# Patient Record
Sex: Female | Born: 1955
Health system: Southern US, Community
[De-identification: ages and names within clinical notes are randomized; demographics above are authoritative.]

## PROBLEM LIST (undated history)

## (undated) DIAGNOSIS — M810 Age-related osteoporosis without current pathological fracture: Secondary | ICD-10-CM

## (undated) DIAGNOSIS — T7840XA Allergy, unspecified, initial encounter: Secondary | ICD-10-CM

## (undated) DIAGNOSIS — F32A Depression, unspecified: Secondary | ICD-10-CM

## (undated) DIAGNOSIS — Z8639 Personal history of other endocrine, nutritional and metabolic disease: Secondary | ICD-10-CM

## (undated) DIAGNOSIS — J449 Chronic obstructive pulmonary disease, unspecified: Secondary | ICD-10-CM

## (undated) DIAGNOSIS — I10 Essential (primary) hypertension: Secondary | ICD-10-CM

## (undated) DIAGNOSIS — H409 Unspecified glaucoma: Secondary | ICD-10-CM

## (undated) DIAGNOSIS — F419 Anxiety disorder, unspecified: Secondary | ICD-10-CM

## (undated) DIAGNOSIS — T6591XA Toxic effect of unspecified substance, accidental (unintentional), initial encounter: Secondary | ICD-10-CM

## (undated) DIAGNOSIS — F329 Major depressive disorder, single episode, unspecified: Secondary | ICD-10-CM

## (undated) DIAGNOSIS — H269 Unspecified cataract: Secondary | ICD-10-CM

## (undated) DIAGNOSIS — S76309A Unspecified injury of muscle, fascia and tendon of the posterior muscle group at thigh level, unspecified thigh, initial encounter: Secondary | ICD-10-CM

## (undated) DIAGNOSIS — G4726 Circadian rhythm sleep disorder, shift work type: Secondary | ICD-10-CM

## (undated) DIAGNOSIS — B019 Varicella without complication: Secondary | ICD-10-CM

## (undated) HISTORY — DX: Personal history of other endocrine, nutritional and metabolic disease: Z86.39

## (undated) HISTORY — DX: Depression, unspecified: F32.A

## (undated) HISTORY — DX: Unspecified cataract: H26.9

## (undated) HISTORY — DX: Anxiety disorder, unspecified: F41.9

## (undated) HISTORY — DX: Varicella without complication: B01.9

## (undated) HISTORY — DX: Unspecified injury of muscle, fascia and tendon of the posterior muscle group at thigh level, unspecified thigh, initial encounter: S76.309A

## (undated) HISTORY — DX: Toxic effect of unspecified substance, accidental (unintentional), initial encounter: T65.91XA

## (undated) HISTORY — DX: Chronic obstructive pulmonary disease, unspecified: J44.9

## (undated) HISTORY — DX: Essential (primary) hypertension: I10

## (undated) HISTORY — DX: Major depressive disorder, single episode, unspecified: F32.9

## (undated) HISTORY — DX: Allergy, unspecified, initial encounter: T78.40XA

## (undated) HISTORY — DX: Circadian rhythm sleep disorder, shift work type: G47.26

## (undated) HISTORY — PX: COLONOSCOPY: SHX174

## (undated) HISTORY — DX: Age-related osteoporosis without current pathological fracture: M81.0

## (undated) HISTORY — DX: Unspecified glaucoma: H40.9

---
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
## 1998-09-10 ENCOUNTER — Ambulatory Visit (HOSPITAL_COMMUNITY): Admission: RE | Admit: 1998-09-10 | Discharge: 1998-09-10 | Payer: Self-pay | Admitting: *Deleted

## 1998-09-10 ENCOUNTER — Encounter: Payer: Self-pay | Admitting: *Deleted

## 1998-12-08 ENCOUNTER — Other Ambulatory Visit: Admission: RE | Admit: 1998-12-08 | Discharge: 1998-12-08 | Payer: Self-pay | Admitting: *Deleted

## 1999-09-22 ENCOUNTER — Ambulatory Visit (HOSPITAL_COMMUNITY): Admission: RE | Admit: 1999-09-22 | Discharge: 1999-09-22 | Payer: Self-pay | Admitting: *Deleted

## 1999-09-22 ENCOUNTER — Encounter: Payer: Self-pay | Admitting: *Deleted

## 1999-12-09 ENCOUNTER — Other Ambulatory Visit: Admission: RE | Admit: 1999-12-09 | Discharge: 1999-12-09 | Payer: Self-pay | Admitting: *Deleted

## 2000-09-20 ENCOUNTER — Ambulatory Visit (HOSPITAL_COMMUNITY): Admission: RE | Admit: 2000-09-20 | Discharge: 2000-09-20 | Payer: Self-pay | Admitting: *Deleted

## 2000-09-20 ENCOUNTER — Encounter: Payer: Self-pay | Admitting: *Deleted

## 2001-02-21 ENCOUNTER — Ambulatory Visit (HOSPITAL_COMMUNITY): Admission: RE | Admit: 2001-02-21 | Discharge: 2001-02-21 | Payer: Self-pay | Admitting: *Deleted

## 2001-02-21 ENCOUNTER — Encounter: Payer: Self-pay | Admitting: *Deleted

## 2001-02-27 ENCOUNTER — Other Ambulatory Visit: Admission: RE | Admit: 2001-02-27 | Discharge: 2001-02-27 | Payer: Self-pay | Admitting: *Deleted

## 2001-06-21 ENCOUNTER — Other Ambulatory Visit: Admission: RE | Admit: 2001-06-21 | Discharge: 2001-06-21 | Payer: Self-pay | Admitting: Gynecology

## 2001-06-21 ENCOUNTER — Encounter (INDEPENDENT_AMBULATORY_CARE_PROVIDER_SITE_OTHER): Payer: Self-pay | Admitting: Specialist

## 2001-09-17 ENCOUNTER — Encounter: Payer: Self-pay | Admitting: Gynecology

## 2001-09-17 ENCOUNTER — Ambulatory Visit (HOSPITAL_COMMUNITY): Admission: RE | Admit: 2001-09-17 | Discharge: 2001-09-17 | Payer: Self-pay | Admitting: Gynecology

## 2002-05-01 ENCOUNTER — Other Ambulatory Visit: Admission: RE | Admit: 2002-05-01 | Discharge: 2002-05-01 | Payer: Self-pay | Admitting: Gynecology

## 2002-09-11 ENCOUNTER — Ambulatory Visit (HOSPITAL_COMMUNITY): Admission: RE | Admit: 2002-09-11 | Discharge: 2002-09-11 | Payer: Self-pay | Admitting: Gynecology

## 2002-09-11 ENCOUNTER — Encounter: Payer: Self-pay | Admitting: Gynecology

## 2002-12-11 ENCOUNTER — Inpatient Hospital Stay (HOSPITAL_COMMUNITY): Admission: EM | Admit: 2002-12-11 | Discharge: 2002-12-15 | Payer: Self-pay | Admitting: Psychiatry

## 2003-05-07 ENCOUNTER — Other Ambulatory Visit: Admission: RE | Admit: 2003-05-07 | Discharge: 2003-05-07 | Payer: Self-pay | Admitting: Gynecology

## 2003-07-27 ENCOUNTER — Inpatient Hospital Stay (HOSPITAL_COMMUNITY): Admission: EM | Admit: 2003-07-27 | Discharge: 2003-08-01 | Payer: Self-pay | Admitting: Psychiatry

## 2003-10-06 ENCOUNTER — Ambulatory Visit (HOSPITAL_COMMUNITY): Admission: RE | Admit: 2003-10-06 | Discharge: 2003-10-06 | Payer: Self-pay | Admitting: Gynecology

## 2004-11-25 ENCOUNTER — Ambulatory Visit (HOSPITAL_COMMUNITY): Admission: RE | Admit: 2004-11-25 | Discharge: 2004-11-25 | Payer: Self-pay | Admitting: Gynecology

## 2004-12-08 ENCOUNTER — Other Ambulatory Visit: Admission: RE | Admit: 2004-12-08 | Discharge: 2004-12-08 | Payer: Self-pay | Admitting: Gynecology

## 2004-12-15 ENCOUNTER — Ambulatory Visit: Payer: Self-pay | Admitting: Family Medicine

## 2005-12-07 ENCOUNTER — Ambulatory Visit (HOSPITAL_COMMUNITY): Admission: RE | Admit: 2005-12-07 | Discharge: 2005-12-07 | Payer: Self-pay | Admitting: Gynecology

## 2005-12-21 ENCOUNTER — Other Ambulatory Visit: Admission: RE | Admit: 2005-12-21 | Discharge: 2005-12-21 | Payer: Self-pay | Admitting: Gynecology

## 2006-05-01 ENCOUNTER — Ambulatory Visit: Payer: Self-pay | Admitting: Internal Medicine

## 2006-05-04 ENCOUNTER — Ambulatory Visit (HOSPITAL_COMMUNITY): Admission: RE | Admit: 2006-05-04 | Discharge: 2006-05-04 | Payer: Self-pay | Admitting: Internal Medicine

## 2006-06-22 ENCOUNTER — Ambulatory Visit (HOSPITAL_COMMUNITY): Admission: RE | Admit: 2006-06-22 | Discharge: 2006-06-22 | Payer: Self-pay | Admitting: Gynecology

## 2006-07-11 ENCOUNTER — Ambulatory Visit: Admission: RE | Admit: 2006-07-11 | Discharge: 2006-07-11 | Payer: Self-pay | Admitting: Gynecologic Oncology

## 2006-11-15 ENCOUNTER — Other Ambulatory Visit: Admission: RE | Admit: 2006-11-15 | Discharge: 2006-11-15 | Payer: Self-pay | Admitting: Gynecology

## 2006-11-28 ENCOUNTER — Ambulatory Visit: Admission: RE | Admit: 2006-11-28 | Discharge: 2006-11-28 | Payer: Self-pay | Admitting: Gynecologic Oncology

## 2006-12-05 ENCOUNTER — Ambulatory Visit (HOSPITAL_COMMUNITY): Admission: RE | Admit: 2006-12-05 | Discharge: 2006-12-05 | Payer: Self-pay | Admitting: Gynecologic Oncology

## 2006-12-06 ENCOUNTER — Ambulatory Visit: Payer: Self-pay | Admitting: Internal Medicine

## 2006-12-27 ENCOUNTER — Ambulatory Visit (HOSPITAL_COMMUNITY): Admission: RE | Admit: 2006-12-27 | Discharge: 2006-12-27 | Payer: Self-pay | Admitting: Gynecology

## 2007-04-05 ENCOUNTER — Ambulatory Visit (HOSPITAL_COMMUNITY): Admission: RE | Admit: 2007-04-05 | Discharge: 2007-04-05 | Payer: Self-pay | Admitting: Gynecologic Oncology

## 2007-04-09 ENCOUNTER — Ambulatory Visit: Admission: RE | Admit: 2007-04-09 | Discharge: 2007-04-09 | Payer: Self-pay | Admitting: Gynecologic Oncology

## 2007-08-13 ENCOUNTER — Telehealth: Payer: Self-pay | Admitting: Internal Medicine

## 2007-10-09 ENCOUNTER — Ambulatory Visit (HOSPITAL_COMMUNITY): Admission: RE | Admit: 2007-10-09 | Discharge: 2007-10-09 | Payer: Self-pay | Admitting: Gynecology

## 2007-11-04 ENCOUNTER — Encounter: Payer: Self-pay | Admitting: Internal Medicine

## 2007-11-28 ENCOUNTER — Other Ambulatory Visit: Admission: RE | Admit: 2007-11-28 | Discharge: 2007-11-28 | Payer: Self-pay | Admitting: Gynecology

## 2007-12-12 ENCOUNTER — Ambulatory Visit (HOSPITAL_COMMUNITY): Admission: RE | Admit: 2007-12-12 | Discharge: 2007-12-12 | Payer: Self-pay | Admitting: Gastroenterology

## 2007-12-12 ENCOUNTER — Encounter: Payer: Self-pay | Admitting: Internal Medicine

## 2007-12-17 ENCOUNTER — Encounter: Payer: Self-pay | Admitting: Internal Medicine

## 2007-12-17 DIAGNOSIS — F329 Major depressive disorder, single episode, unspecified: Secondary | ICD-10-CM

## 2007-12-17 DIAGNOSIS — F411 Generalized anxiety disorder: Secondary | ICD-10-CM | POA: Insufficient documentation

## 2007-12-17 DIAGNOSIS — F3289 Other specified depressive episodes: Secondary | ICD-10-CM | POA: Insufficient documentation

## 2007-12-17 DIAGNOSIS — F418 Other specified anxiety disorders: Secondary | ICD-10-CM | POA: Insufficient documentation

## 2007-12-17 DIAGNOSIS — G4700 Insomnia, unspecified: Secondary | ICD-10-CM

## 2008-01-31 ENCOUNTER — Ambulatory Visit (HOSPITAL_COMMUNITY): Admission: RE | Admit: 2008-01-31 | Discharge: 2008-01-31 | Payer: Self-pay | Admitting: Gynecology

## 2008-02-25 ENCOUNTER — Telehealth: Payer: Self-pay | Admitting: Internal Medicine

## 2008-03-09 ENCOUNTER — Ambulatory Visit: Payer: Self-pay | Admitting: Internal Medicine

## 2008-03-09 DIAGNOSIS — G4729 Other circadian rhythm sleep disorder: Secondary | ICD-10-CM

## 2008-03-09 DIAGNOSIS — F17200 Nicotine dependence, unspecified, uncomplicated: Secondary | ICD-10-CM | POA: Insufficient documentation

## 2008-03-09 DIAGNOSIS — M899 Disorder of bone, unspecified: Secondary | ICD-10-CM | POA: Insufficient documentation

## 2008-03-09 DIAGNOSIS — M949 Disorder of cartilage, unspecified: Secondary | ICD-10-CM

## 2008-03-09 DIAGNOSIS — M810 Age-related osteoporosis without current pathological fracture: Secondary | ICD-10-CM | POA: Insufficient documentation

## 2008-04-24 ENCOUNTER — Ambulatory Visit (HOSPITAL_COMMUNITY): Admission: RE | Admit: 2008-04-24 | Discharge: 2008-04-24 | Payer: Self-pay | Admitting: Gynecology

## 2008-09-15 ENCOUNTER — Telehealth: Payer: Self-pay | Admitting: Internal Medicine

## 2008-10-23 ENCOUNTER — Telehealth: Payer: Self-pay | Admitting: Internal Medicine

## 2008-10-28 ENCOUNTER — Ambulatory Visit: Payer: Self-pay | Admitting: Internal Medicine

## 2008-11-28 ENCOUNTER — Ambulatory Visit (HOSPITAL_COMMUNITY): Admission: RE | Admit: 2008-11-28 | Discharge: 2008-11-28 | Payer: Self-pay | Admitting: Gynecology

## 2009-02-05 ENCOUNTER — Ambulatory Visit (HOSPITAL_COMMUNITY): Admission: RE | Admit: 2009-02-05 | Discharge: 2009-02-05 | Payer: Self-pay | Admitting: Gynecology

## 2009-04-01 ENCOUNTER — Telehealth: Payer: Self-pay | Admitting: Internal Medicine

## 2009-07-23 ENCOUNTER — Ambulatory Visit: Payer: Self-pay | Admitting: Internal Medicine

## 2009-07-23 DIAGNOSIS — L259 Unspecified contact dermatitis, unspecified cause: Secondary | ICD-10-CM

## 2009-08-26 ENCOUNTER — Emergency Department (HOSPITAL_COMMUNITY): Admission: EM | Admit: 2009-08-26 | Discharge: 2009-08-26 | Payer: Self-pay | Admitting: Emergency Medicine

## 2009-09-13 ENCOUNTER — Ambulatory Visit: Admission: RE | Admit: 2009-09-13 | Discharge: 2009-09-13 | Payer: Self-pay | Admitting: Internal Medicine

## 2009-09-13 ENCOUNTER — Encounter: Payer: Self-pay | Admitting: Internal Medicine

## 2009-09-13 ENCOUNTER — Ambulatory Visit: Payer: Self-pay | Admitting: Vascular Surgery

## 2009-10-21 ENCOUNTER — Ambulatory Visit (HOSPITAL_COMMUNITY): Admission: RE | Admit: 2009-10-21 | Discharge: 2009-10-21 | Payer: Self-pay | Admitting: Gynecology

## 2009-11-19 ENCOUNTER — Ambulatory Visit (HOSPITAL_COMMUNITY): Admission: RE | Admit: 2009-11-19 | Discharge: 2009-11-19 | Payer: Self-pay | Admitting: Gynecology

## 2010-01-13 ENCOUNTER — Ambulatory Visit: Payer: Self-pay | Admitting: Internal Medicine

## 2010-01-13 DIAGNOSIS — L0100 Impetigo, unspecified: Secondary | ICD-10-CM | POA: Insufficient documentation

## 2010-01-13 DIAGNOSIS — M79609 Pain in unspecified limb: Secondary | ICD-10-CM | POA: Insufficient documentation

## 2010-01-14 ENCOUNTER — Encounter: Payer: Self-pay | Admitting: Internal Medicine

## 2010-02-01 ENCOUNTER — Ambulatory Visit: Payer: Self-pay

## 2010-02-01 ENCOUNTER — Encounter: Payer: Self-pay | Admitting: Internal Medicine

## 2010-02-23 ENCOUNTER — Ambulatory Visit (HOSPITAL_COMMUNITY): Admission: RE | Admit: 2010-02-23 | Discharge: 2010-02-23 | Payer: Self-pay | Admitting: Gynecology

## 2010-10-16 ENCOUNTER — Encounter: Payer: Self-pay | Admitting: Gynecologic Oncology

## 2010-10-16 ENCOUNTER — Encounter: Payer: Self-pay | Admitting: Gynecology

## 2010-10-17 ENCOUNTER — Encounter: Payer: Self-pay | Admitting: Gynecology

## 2010-10-27 NOTE — Miscellaneous (Signed)
Summary: Orders Update  Clinical Lists Changes  Problems: Added new problem of UNSPECIFIED PERIPHERAL VASCULAR DISEASE (ICD-443.9) Orders: Added new Test order of Arterial Duplex Lower Extremity (Arterial Duplex Low) - Signed 

## 2010-10-27 NOTE — Assessment & Plan Note (Signed)
Summary: 6 month rov/njr   Vital Signs:  Patient profile:   55 year old female Menstrual status:  postmenopausal Weight:      131 pounds Pulse rate:   78 / minute BP sitting:   120 / 70  (left arm) Cuff size:   regular  Vitals Entered By: Romualdo Bolk, CMA (AAMA) (January 13, 2010 8:38 AM) CC: follow-up visit   History of Present Illness: Jasmine Nelson comes in today for follow up of medcial management  Since last visit  had ann injury at work  where she was seen in  Occupational health ... tripped over a rocking chair and tore right hamstring.  with bruising and  swelling down leg   She saw [facility] .  Had  swellingin lower  leg and rx for cellulitis and DVT evlautation  negative .   Released.   but has continued red leg.   Maybe a skin problem.   Still has stiffness in lower leg  and feels tight . Says the hair follicles are pricly and growing back funny.  This never ocurred before thei injury  still has some redness .   Also had nasal sores left fo=more than right rx by [facility] with levaquin  and improved but coming and going .  sore a crusted today . No fever and no culture ever done.  Sleep shift work doing ok on [employer].   now will be getting rx from [facility].   Tobacco: still smoking dermatitis: no change  Depr anx :    stable per Psych . Gyne  : had abn pap and high risk HPV had ? about this.    Preventive Screening-Counseling & Management  Alcohol-Tobacco     Alcohol drinks/day: 0     Smoking Status: current     Smoking Cessation Counseling: yes     Smoke Cessation Stage: contemplative     Packs/Day: 1.0  Caffeine-Diet-Exercise     Caffeine use/day: 5-6     Does Patient Exercise: no  Current Medications (verified): 1)  Cymbalta 60 Mg  Cpep (Duloxetine Hcl) .Marland Kitchen.. 1 By Mouth Once Daily 2)  Gabapentin 100 Mg Caps (Gabapentin) .... 3 By Mouth At Bedtime 3)  Ambien 10 Mg  Tabs (Zolpidem Tartrate) .... As Needed 4)  Nuvigil 150 Mg  Tabs (Armodafinil) .Marland Kitchen.. 1 By  Mouth Once Daily or As Directed 5)  Triamcinolone Acetonide 0.5 % Oint (Triamcinolone Acetonide) .... Apply To Hands Two Times A Day As Directed 6)  Vitamin D (Ergocalciferol) 50000 Unit Caps (Ergocalciferol)  Allergies (verified): No Known Drug Allergies  Past History:  Past medical, surgical, family and social histories (including risk factors) reviewed, and no changes noted (except as noted below).  Past Medical History: chicken pox as a child  3rd grade peds [employer] thyroid problem g0p0 ingestion 3/04   Shift work rx  TOrn hamstring Consults Meredith Baker-psych   [facility]  Gyne    osteopenia on dexa    Past Surgical History: Reviewed history from 03/09/2008 and no changes required. adenoidectomy  Past History:  Care Management: Gynecology: [facility] Dermatology: Danella Deis in the past Psychiatry: Valinda Hoar  , Nolen Mu  Family History: Reviewed history from 10/28/2008 and no changes required. father   osa  ht deceased mom NHLymphoma  1997 sister a and w  Ovarian cancer ? 2 aunts       Social History: Reviewed history from 10/28/2008 and no changes required. 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    Physical Exam  General:  alert, well-developed, well-nourished, and well-hydrated.   Head:  normocephalic and no abnormalities observed.   Eyes:  vision grossly intact, pupils equal, and pupils round.   Ears:  R ear normal and L ear normal.   Nose:  no external deformity.  small area red rough  left nostril and redness and tenderness without discharge or occulsion Mouth:  pharynx pink and moist.   Neck:  No  deformities, masses, or tenderness noted. Lungs:  Normal respiratory effort, chest expands symmetrically. Lungs are clear to auscultation, no crackles or wheezes.no dullness.   Heart:  Normal rate and regular rhythm. S1 and S2 normal without gallop, murmur, click, rub or other extra sounds. Abdomen:  Bowel sounds positive,abdomen soft and non-tender without masses, organomegaly or   noted. Msk:  left lower ext with local chronic red area medially right leg  no edema   some shinniness and  slightly assymetrical from left   left knee with tender non red swelling at inferior kneecap. otherwise nl. Pulses:  pulses intact without delay   Extremities:  trace left pedal edema and trace right pedal edema.   Neurologic:  alert & oriented X3 and gait normal.   Skin:  turgor normal, color normal, no petechiae, and no purpura.   Cervical Nodes:  No lymphadenopathy noted Psych:  Oriented X3, good eye contact, not anxious appearing, and not depressed appearing.     Impression & Recommendations:  Problem # 1:  IMPETIGO (ICD-684)? nasal  drusting and soreness off and on .   rx with levaquin but nver resolved  will do nasal swab cx and use topicals for now.  Orders: T-Culture, Wound ([id number])  Problem # 2:  PAIN IN SOFT TISSUES OF LIMB (ICD-729.5)  more tightness discomfort   ? chrinic changes from the edema of the injury?    can feel pulses but  reg get abi  because os a smoker  . r/o pvd   Orders: Vascular Clinic (Vascular)  Problem # 3:  OTHER CIRCADIAN RHYTHM SLEEP DISORDER (ICD-327.39) Assessment: Unchanged will be having meredith baker rx this and no more rx from [place]  currently.   Problem # 4:  TOBACCO USE (ICD-305.1)  rec dc  as before.   Orders: Vascular Clinic (Vascular)  Problem # 5:  PAP SMEAR, ABNORMAL  HPVHIGH RISK (ICD-795.00) Assessment: New not related to above problems   Complete Medication List: 1)  Cymbalta 60 Mg Cpep (Duloxetine hcl) .Marland Kitchen.. 1 by mouth once  daily 2)  Gabapentin 100 Mg Caps (Gabapentin) .... 3 by mouth at bedtime 3)  Ambien 10 Mg Tabs (Zolpidem tartrate) .... As needed 4)  Nuvigil 150 Mg Tabs (Armodafinil) .Marland Kitchen.. 1 by mouth once daily or as directed change ot meredith baker  4 2011 5)  Triamcinolone Acetonide 0.5 % Oint (Triamcinolone acetonide) .... Apply to hands two times a day as directed 6)  Vitamin D (ergocalciferol) 50000 Unit Caps (Ergocalciferol)  Patient Instructions: 1)  will call about culture. 2)  Use a topical antibiotic in the nostril    tic for now  3)  call if getting worse in meantime.  4)  In regard to your leg   will order a ABI     if ok  then   may do a ortho appt. 5)  Perhaps need more rehab for leg.    The previous swelling may have caused the skin changes.   6)  will contact  you  about result and plan

## 2010-12-08 ENCOUNTER — Other Ambulatory Visit (HOSPITAL_COMMUNITY): Payer: Self-pay | Admitting: Gynecology

## 2010-12-08 DIAGNOSIS — N83209 Unspecified ovarian cyst, unspecified side: Secondary | ICD-10-CM

## 2010-12-16 ENCOUNTER — Other Ambulatory Visit (HOSPITAL_COMMUNITY): Payer: Self-pay | Admitting: Gynecology

## 2010-12-16 ENCOUNTER — Ambulatory Visit (HOSPITAL_COMMUNITY)
Admission: RE | Admit: 2010-12-16 | Discharge: 2010-12-16 | Disposition: A | Discharge status: Home / Self Care | Payer: 59 | Source: Ambulatory Visit | Attending: Gynecology | Admitting: Gynecology

## 2010-12-16 DIAGNOSIS — N949 Unspecified condition associated with female genital organs and menstrual cycle: Secondary | ICD-10-CM | POA: Insufficient documentation

## 2010-12-16 DIAGNOSIS — N83209 Unspecified ovarian cyst, unspecified side: Secondary | ICD-10-CM

## 2010-12-16 DIAGNOSIS — Z8041 Family history of malignant neoplasm of ovary: Secondary | ICD-10-CM | POA: Insufficient documentation

## 2011-02-07 NOTE — Consult Note (Signed)
NAMEKAYCE, CHISMAR                  ACCOUNT NO.:  [id number]   MEDICAL RECORD NO.:  [id number]          PATIENT TYPE:  OUT   LOCATION:  GYN                          FACILITY:  [facility]   PHYSICIAN:  Paola A. Duard Brady, MD    DATE OF BIRTH:  07/06/1956   DATE OF CONSULTATION:  04/09/2007  DATE OF DISCHARGE:                                 CONSULTATION   The patient is a very pleasant, 55- soon to be 55 year old who we  initially saw in October of 2007.  She was referred to [place] for an ovarian  cyst as well as a family history of ovarian cancer with her paternal  grandmother having breast cancer and a maternal aunt who had ovarian  cancer.  Her family history is a little unclear as she may have had up  to 3 paternal aunts with ovarian cancer but she does not know the  details.  In September 2007, she showed a 1 x 1-cm cyst in the right  ovary with the left ovary having a 2.6 x 2.7-cm clear cyst and normal CA-  125.  Her CA-125 when I saw her March 2008 from September 2007 was 5.7.  When we saw her in March of 2008, she had a hypoechoic lesion within the  left ovary.  There were no mural nodules or solid components.  Measured  1.7 x 1.1 x 1.6 cm which represented a decrease in size compared to the  2.3 x 2.1 x 2.2-cm cyst in September 2007.  She comes in today for  follow-up.  She had ultrasound on July 11.  It revealed the uterus to be  normal in appearance.  The endometrium measured 2 mm.  There was a cyst  involving the left ovary.  It showed no evidence of internal flow on  Doppler.  It measured 1.7 x 1.1 x 1.3 cm and was stable in size as  compared to that in March and overall decreased as compared to that in  September.  She had a 1-cm simple cyst of the right ovary.  She comes in  today for evaluation.  She otherwise denies any significant complaints.  She has no symptoms of bloating, early satiety.  She had been menopausal  for the past 4 years, continues to have hot flashes but they are  decreasing.   PHYSICAL EXAMINATION:  ABDOMEN:  Is soft, nontender, nondistended.  There are no palpable masses or hepatosplenomegaly.  Groins are negative  for adenopathy.  EXTREMITIES:  There is no edema.  PELVIC:  External genitalia is within normal limits.  Bimanual  examination:  The cervix is palpably normal.  The corpus is normal size,  shape, consistency.  There are no adnexal masses.   ASSESSMENT:  A 55 year old with a 1-cm complex left ovarian cyst which  has been decreasing in size since September of 2007 in stable in size  from March of 2008.  We reviewed these findings.  I believe that  conservative follow-up can be continued as she has had no changes.  What  I would recommend is checking  a CA-125 today.  If her CA-125 is normal,  repeating an ultrasound in 6 months, if at that time there has been no  change, I believe that  she can continue to be followed.  She would like to have this follow-up  performed by Dr. Chevis Pretty.  We will send him a copy from today.  She knows  to call the hospital and schedule an ultrasound for 6 months' time.  Her  questions were elicited and answered to her satisfaction.      Paola A. Duard Brady, MD  Electronically Signed     PAG/MEDQ  D:  04/09/2007  T:  04/09/2007  Job:  [id number]   cc:   Neta Mends. Fabian Sharp, MD  [address] [place], [place] [address]   Leatha Gilding. Mezer, M.D.  Fax: [phone]   Telford Nab, R.N.  [address]  [place], [place] [address]

## 2011-02-10 NOTE — H&P (Signed)
NAME:  Jasmine Nelson, Jasmine Nelson NO.:  [id number]   MEDICAL RECORD NO.:  [id number]                   PATIENT TYPE:  IPS   LOCATION:  0503                                 [facility]ACILITY[facility]   PHYSICIAN:  Jeanice Lim, M.D.              DATE OF BIRTH:  May 28, 1956   DATE OF ADMISSION:  12/11/2002  DATE OF DISCHARGE:  12/15/2002                         PSYCHIATRIC ADMISSION ASSESSMENT   IDENTIFYING INFORMATION:  This is a 55 year old married white female  voluntarily admitted on December 11, 2002.   HISTORY OF PRESENT ILLNESS:  The patient presents with a history of  intentional overdose.  The patient is unsure of what she took.  She thinks  she took a handful of her old Prozac.  She was drinking vodka and orange  juice prior to this ingestion.  She reports she has been working nights for  years.  She states she comes home and sometimes drinks in the morning up to  maybe 2-3 times per week and thinks she drinks more than she should.  She  impulsively overdosed on her medication.  She states her husband has been  unemployed for the past year and a half.  She states she has been out of her  routine and needs her space.  The patient states that she knows it is not  her husband's fault.  She feels very overwhelmed at times, having  interrupted sleep.  Reports decreased appetite.  Denies any hallucinations.  Denies withdrawal symptoms at this time.   PAST PSYCHIATRIC HISTORY:  First hospitalization to [facility].  No other inpatient admissions.  No outpatient treatment.  Used to  see Dr. Madaline Guthrie in the 1990s.  No history of a suicide attempt.   SOCIAL HISTORY:  This is a 55 year old married white female, married for 21  years, first marriage, no children.  Lives with her husband.  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  No legal charges.  Completed her bachelors  degree.   ALCOHOL/DRUG HISTORY:  The patient smokes.  Denies any drug use.  Has  been  drinking up to 2-3 times per week, in the morning.   PRIMARY CARE PHYSICIAN:  Dr. Berniece Andreas in [place] at [facility].   MEDICAL PROBLEMS:  None.   MEDICATIONS:  Lexapro 10 mg (has been on it for one month, prescribed by Dr.  Fabian Sharp).   ALLERGIES:  No known allergies.   PHYSICAL EXAMINATION:  The patient was assessed at [facility] after her  overdose.  The patient is a middle-aged Caucasian female in no acute  distress, healthy-appearing.  Her vital signs are temperature 98.9, heart  rate 94, respirations 20, blood pressure 147/85.  She is 113 pounds.   LABORATORY DATA:  Her CMET with BUN 5.  Urine pregnancy test was negative.  Alcohol level was 272 on arrival.   MENTAL STATUS EXAM:  She is an alert, middle-aged female.  Cooperative with  fair eye contact.  Speech is clear.  Mood, patient feels very embarrassed,  feels bad about the situation.  Affect is flat, very pleasant.  Thought  processes are coherent.  No evidence of psychosis.  No auditory or visual  hallucinations.  No suicidal or homicidal ideation.  Cognitive function  intact.  Memory is good.  Judgment is fair.  Insight is poor in regards to  alcohol intake and consequences.   DIAGNOSES:   AXIS I:  1. Major depressive disorder.  2. Alcohol abuse.   AXIS II:  Deferred.   AXIS III:  None.   AXIS IV:  Problems with primary support group, economic, other psychosocial  problems.   AXIS V:  Current 40; estimated this past year 70.   PLAN:  Voluntary admission for intentional overdose.  Contract for safety.  Check every 15 minutes.  Will resume her Lexapro to decrease depressive  symptoms.  Have Librium available for detox on a p.r.n. basis.  Encourage  fluids.  Will increase coping skills by attending groups.  The patient to  remain alcohol-free.  Have a family session prior to discharge and follow up  with a private psychiatrist.   TENTATIVE LENGTH OF STAY:  Three to four days.     Landry Corporal, N.P.                       Jeanice Lim, M.D.    JO/MEDQ  D:  12/15/2002  T:  12/15/2002  Job:  [id number]

## 2011-02-10 NOTE — Discharge Summary (Signed)
NAME:  Jasmine Nelson, Jasmine Nelson NO.:  [id number]   MEDICAL RECORD NO.:  [id number]                   PATIENT TYPE:  IPS   LOCATION:  0301                                 [facility]ACILITY:  [facility]   PHYSICIAN:  Jeanice Lim, M.D.              DATE OF BIRTH:  06-Jan-1956   DATE OF ADMISSION:  07/27/2003  DATE OF DISCHARGE:  08/01/2003                                 DISCHARGE SUMMARY   IDENTIFYING DATA:  This is a 55 year old Caucasian female involuntarily  committed, passed out on floor.  Husband was Hotel manager.  Admitted to daily  use of alcohol, three-quarters of a bottle.  The patient out of work since  October secondary to alcohol use.  Increased anxiety for one month.  Depressed mood.  Second hospitalization for this patient.   MEDICATIONS:  Climara (not using), Effexor XR 150 mg (uses only 150 mg a  day).  Not clear Effexor helps with anxiety but taking it only 150 mg daily  when she was prescribed 150 mg b.i.d.   ALLERGIES:  No known drug allergies.   PHYSICAL EXAMINATION:  Within normal limits.  Neurologically nonfocal.   LABORATORY DATA:  Routine admission labs within normal limits.   MENTAL STATUS EXAM:  Fully alert, reclusive, resistant to interview,  withholding of emotions and thoughts, meanings of alcohol use, minimizing  addiction.  Speech within normal limits.  Mood irritable, apathetic.  Thought processes goal directed.  Suicidal ideation, vague.  Contracting for  safety.  Cognitively intact.  Judgment and insight poor with poor impulse  control.   ADMISSION DIAGNOSES:   AXIS I:  1. Major depressive disorder, recurrent, moderate.  2. Alcohol dependence.   AXIS II:  Depressed.   AXIS III:  Elevated blood pressure possibly related to withdrawal of alcohol  use.   AXIS IV:  Moderate (conflicts with primary support system).   AXIS V:  35/60.   HOSPITAL COURSE:  The patient was admitted and ordered routine p.r.n.  medications and underwent  further monitoring.  Was encouraged to participate  in individual, group and milieu therapy.  The patient was placed on a  Librium detox protocol and monitored for safety and treated for depression.  The patient reported a positive response to medication changes and  interventions.  Tolerated medication changes without side effects.  The  patient reported no dangerous ideation or withdrawal symptoms at the time of  discharge.  Affect was brighter and patient reported motivation to be  compliant with the aftercare plan.   DISCHARGE MEDICATIONS:  1. Effexor XR 75 mg, 3 daily.  2. Neurontin 100 mg, 2 tabs three times a day.  3. Seroquel 25 mg q.h.s.  4. Ambien 10 mg q.h.s. p.r.n.   FOLLOW UP:  The patient was to follow up with the [facility] on August 04, 2003 at 12 p.m.   DISCHARGE DIAGNOSES:   AXIS I:  1. Major depressive disorder, recurrent, moderate.  2. Alcohol dependence.   AXIS II:  Depressed.   AXIS III:  Elevated blood pressure possibly related to withdrawal of alcohol  use.   AXIS IV:  Moderate (conflicts with primary support system).   AXIS V:  Global Assessment of Functioning on discharge 55.                                               Jeanice Lim, M.D.    JEM/MEDQ  D:  08/27/2003  T:  08/27/2003  Job:  [id number]

## 2011-02-10 NOTE — Consult Note (Signed)
NAMEARRIN, Jasmine Nelson                  ACCOUNT NO.:  [id number]   MEDICAL RECORD NO.:  [id number]          PATIENT TYPE:  OUT   LOCATION:  GYN                          FACILITY[facility]  [facility]   PHYSICIAN:  Paola A. Duard Brady, MD    DATE OF BIRTH:  1956/05/10   DATE OF CONSULTATION:  11/28/2006  DATE OF DISCHARGE:                                 CONSULTATION   HISTORY:  The patient is a 55 year old who we initially saw in October  2007.  She was referred to [place] secondary to an ovarian cyst, as well as a  family history of ovarian cancer.  She had ultrasound in September 2007  that revealed a 1 x 1 cm cyst in the right ovary.  The left ovary had a  2.6 x 2.7 cm clear appearing cyst CA-125's per her report have been  normal.  The most recent CA-125 that we had recorded was June 20, 2006; and it was 5.7.  When I saw her in October it was deemed that we  would follow up with the cyst in 6 months with an ultrasound and CA-125.  She was seen by Dr. Chevis Pretty for her annual examination in February 2007.  At that time, her exam was apparently unremarkable.  No ultrasound or CA-  125 was performed at that time.  The patient is up-to-date on her  mammograms as of December 07, 2005.  She otherwise denies any significant  complaints.   PAST MEDICAL HISTORY:  Anxiety.   MEDICATIONS:  Neurontin.   ALLERGIES:  None.   PAST SURGICAL HISTORY:  1. D&C  2. Hysteroscopy for polyps.   SOCIAL HISTORY:  She smokes 1/2 to 1 pack per day.  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   FAMILY HISTORY:  She has a paternal grandmother with breast cancer and  cervical cancer.  She had a maternal aunt who died of ovarian cancer in  her 73s, and three paternal great aunts who may have had ovarian cancer.  Her paternal grandmother also had endometrial cancer.   PHYSICAL EXAMINATION:  VITAL SIGNS:  135 pounds, well nourished well-  developed female in no acute distress.  Physical exam was  deferred.   Twenty-five minutes of face-to-face time was spent with the patient  discussing options.  As we do not have any other additional imaging, we  will proceed with an ultrasound to evaluate for ovarian pathology.  The  patient is, today, complaining of dyspareunia, while a component that  may be vaginal dryness versus related to cervical or uterine changes is  difficult to ascertain.  We will give her some topical ointments to use;  namely, Astroglide to see if that helps with her complaints of  dyspareunia scheduled for her for an ultrasound.  Once that ultrasound  is available, we will contact her with disposition and recommendations  for the consideration of surgery.  She does work third  shift.  There are no other technicians that work third shift with her;  therefore, if we were to contemplate surgery, she  would be planning to  go out as far as mid-May in order to give her supervisor ample time for  scheduling arrangements.      Paola A. Duard Brady, MD  Electronically Signed     PAG/MEDQ  D:  11/28/2006  T:  11/28/2006  Job:  [id number]   cc:   Neta Mends. Fabian Sharp, MD  [address] [place], [place] [address]   Leatha Gilding. Mezer, M.D.  Fax: [phone]   Telford Nab, R.N.  [address]  [place], [place] [address]

## 2011-02-10 NOTE — Consult Note (Signed)
NAMEJEMMIE, Jasmine Nelson                  ACCOUNT NO.:  [id number]   MEDICAL RECORD NO.:  [id number]          PATIENT TYPE:  OUT   LOCATION:  GYN                          FACILITY:  [facility]   PHYSICIAN:  Paola A. Duard Brady, MD    DATE OF BIRTH:  1956-08-23   DATE OF CONSULTATION:  07/11/2006  DATE OF DISCHARGE:                                   CONSULTATION   HISTORY OF PRESENT ILLNESS:  The patient is seen today in consultation at  the request of Dr. Chevis Pretty.  Jasmine Nelson is a 55 year old gravida 0 who has been  menopausal for approximately 4 years.  She is referred to [place] secondary to  ovarian cyst as well as a significant family history of ovarian carcinoma.  She did undergo an ultrasound in September 2007 that revealed the right  ovary to measure 2x1.4 cm with a 1x1 cm cyst.  Left ovary measured 3.1x2.9  cm with a 2.6x2.7 cm clear appearing cyst.  Per report, CA-125 has been  drawn and has been unremarkable.  Her most recent CA-125 that we have  recorded is from September 26 and it was 5.7.  The patient has been  undergoing biannual screening with a CA-125 and ultrasounds by Dr. Chevis Pretty  secondary to her family history of ovarian carcinoma.  Her history is a  little vague.  She has a maternal aunt who died of ovarian cancer in her  73s, and she has three paternal great-aunts who may have died of ovarian  carcinoma.  Unfortunately, all the affected individuals or those who know  more about the family history are all deceased.  The patient, herself, is  doing quite well.  She does have some hot flashes.  She has never been on  hormone replacement therapy.  She states that they have attempted to try her  on birth control pills including Ortho Evra for quite sometime, and she  never really tolerated it, as it made her feel poorly or she had bleeding.  She does occasionally have some diarrhea and occasional bloating which is  not consistent.  She has occasional pelvic pressure.  No distinct pain, no  early satiety.  No significant change in bowel or bladder habits.  In  hindsight, she states she does occasionally have some twinges of pelvic  pain.   PAST MEDICAL HISTORY:  Significant for anxiety.   MEDICATIONS:  1. Neurontin 200-300 mg daily.  2. SSRI-type medicine, she does not recall the name.   ALLERGIES:  None.   PAST SURGICAL HISTORY:  1. D&C.  2. Hysteroscopy for uterine polyps.   SOCIAL HISTORY:  She smokes a pack per day, she has done this for 30 years.  She denies use of alcohol.  She is married.  She is and Dentist at  [employer].   FAMILY HISTORY:  Family history is as above.  She had a paternal grandmother  with breast cancer and cervical cancer.  She survived into her 54's.  She  has a maternal aunt who died of ovarian cancer in her 52's and three  paternal  great-aunts who have had ovarian cancer.   HEALTH MAINTENANCE:  She is up-to-date on her mammograms and Pap smear.  She  never had a colonoscopy.   PHYSICAL EXAMINATION:  VITAL SIGNS:  Height 5 feet, 7 inches, weight 131  pounds.  GENERAL:  Well-nourished, well-developed female in no acute distress.  NECK:  Supple.  No lymphadenopathy, no thyromegaly.  LUNGS:  Clear to auscultation bilaterally.  CARDIOVASCULAR:  Regular rate and rhythm.  ABDOMEN:  Soft, nontender, nondistended.  No palpable masses or  hepatosplenomegaly.  Groins are negative for adenopathy.  PELVIC:  External genitalia is within normal limits.  Bimanual examination:  The cervix is palpably normal.  The corpus of normal size, shape,  consistency.  No adnexal masses appreciated.   ASSESSMENT:  A 55 year old gravida 0 with a family history of breast ovarian  carcinoma who also has an ovarian cyst.  She comes in today for evaluation  and follow-up.  I do think genetic testing will be a minimal  informativeness, as she herself is not affected.  I do think her risk  is at  least 5% based on multiple second and third degree  family members being  affected.  We did discuss the opportunity for laparoscopic surgery including  laparoscopic bilateral salpingo-oophorectomy.  Without any uterine  complaints, I do think the benefits of hysterectomy would be minimal in this  situation.  She does have an ovarian cyst and some occasional discomfort.  So this, along with her family history justifies procedure.  Should the  patient wish to not proceed with surgery, I would recommend continued follow-  up with biannual pelvic exam, CA-125 and pelvic ultrasounds as Dr. Chevis Pretty is  doing.  1. I have also encouraged the patient for smoking cessation.  2. Encouraged follow-up and scheduling colonoscopy.      Paola A. Duard Brady, MD  Electronically Signed     PAG/MEDQ  D:  07/11/2006  T:  07/12/2006  Job:  [id number]   cc:   Leatha Gilding. Mezer, M.D.  Fax: [phone]   Neta Mends. Fabian Sharp, MD  [address] [place]  [place] [address]   Telford Nab, R.N.  [address]  [place], [place] [address]

## 2011-02-10 NOTE — Discharge Summary (Signed)
NAME:  Jasmine Nelson, Jasmine Nelson NO.:  [id number]   MEDICAL RECORD NO.:  [id number]                   PATIENT TYPE:  IPS   LOCATION:  0503                                 FACILITY:  [facility]   PHYSICIAN:  Jeanice Lim, M.D.              DATE OF BIRTH:  20-Mar-1956   DATE OF ADMISSION:  12/11/2002  DATE OF DISCHARGE:  12/15/2002                                 DISCHARGE SUMMARY   IDENTIFYING DATA:  This is a 55 year old married Caucasian female  voluntarily admitted after an intentional overdose.  She was unsure of what  she took, a handful of old Prozac.  The patient was overwhelmed.  Husband  had been unemployed.   MEDICATIONS:  Lexapro for one month.   DRUG ALLERGIES:  No known drug allergies.   PHYSICAL EXAMINATION:  GENERAL:  Essentially within normal limits.  NEUROLOGIC:  Nonfocal.   LABORATORY DATA:  Routine admission labs: CMET and CBC were within normal  limits.  Urine drug screen: Negative.  Alcohol level was 272.   MENTAL STATUS EXAM:  Alert, middle-aged female, cooperative, fair eye  contact.  Speech: Clear.  Mood: Embarrassed, felt bad, dysphoric. Affect:  Flat.  Thought process: Goal directed.  Thought content: Negative for  dangerous ideation.  Cognitive: Intact.  Judgment and insight: Fair.   ADMISSION DIAGNOSES:   AXIS I:  1. Major depressive disorder.  2. Alcohol abuse.   AXIS II:  None.   AXIS III:  None.   AXIS IV:  Moderate problems with primary support group and economic  problems, other psychosocial stressors.   AXIS V:  40/70   HOSPITAL COURSE:  The patient was admitted, ordered routine p.r.n.  medications, underwent further monitoring, and was encouraged to participate  in individual, group, and milieu therapy.  The patient was placed on Librium  detoxification p.r.n. withdrawal symptoms and resumed on Lexapro.  Plan was  to titrate Effexor and gradually decrease Lexapro as the patient stabilized  on Effexor.  The  patient reported a positive response to clinical  intervention and was motivated to followup with the IOP.   CONDITION ON DISCHARGE:  Her condition on discharge was markedly improved.  Mood was more euthymic.  Affect: Brighter.  Thought processes: Goal  directed.  Thought content: Negative for dangerous ideation or psychotic  symptoms.  The patient reported motivation to be compliant with the followup  plan including being abstinent from alcohol and taking her medications as  prescribed.   DISCHARGE MEDICATIONS:  1. Lexapro 10 mg one half q.a.m. for eight days and then stop.  2. Effexor XR 75 mg q.a.m. and 75 mg at 3 p.m.  3. Ambien 10 mg q.h.s. p.r.n. insomnia.  4. Vistaril 50 mg q.6.h. p.r.n. anxiety.   FOLLOW UP:  The patient was set up with the IOP and was interested in follow  up with Andee Poles, M.D., following CD  IOP.   DISCHARGE DIAGNOSES:   AXIS I:  1. Major depressive disorder.  2. Alcohol abuse.   AXIS II:  None.   AXIS III:  None.   AXIS IV:  Moderate problems with primary support group and economic  problems, other psychosocial stressors.   AXIS V:  Global assessment of functioning on discharge was 55.                                               Jeanice Lim, M.D.    JEM/MEDQ  D:  12/31/2002  T:  12/31/2002  Job:  [id number]

## 2011-03-08 ENCOUNTER — Other Ambulatory Visit (HOSPITAL_COMMUNITY): Payer: Self-pay | Admitting: Gynecology

## 2011-03-08 DIAGNOSIS — Z1239 Encounter for other screening for malignant neoplasm of breast: Secondary | ICD-10-CM

## 2011-03-10 ENCOUNTER — Ambulatory Visit (HOSPITAL_COMMUNITY)
Admission: RE | Admit: 2011-03-10 | Discharge: 2011-03-10 | Disposition: A | Discharge status: Home / Self Care | Payer: 59 | Source: Ambulatory Visit | Attending: Gynecology | Admitting: Gynecology

## 2011-03-10 DIAGNOSIS — Z1239 Encounter for other screening for malignant neoplasm of breast: Secondary | ICD-10-CM

## 2011-03-10 DIAGNOSIS — Z1231 Encounter for screening mammogram for malignant neoplasm of breast: Secondary | ICD-10-CM | POA: Insufficient documentation

## 2011-06-22 ENCOUNTER — Inpatient Hospital Stay (INDEPENDENT_AMBULATORY_CARE_PROVIDER_SITE_OTHER)
Admission: RE | Admit: 2011-06-22 | Discharge: 2011-06-22 | Disposition: A | Discharge status: Home / Self Care | Payer: 59 | Source: Ambulatory Visit | Attending: Family Medicine | Admitting: Family Medicine

## 2011-06-22 DIAGNOSIS — L089 Local infection of the skin and subcutaneous tissue, unspecified: Secondary | ICD-10-CM

## 2011-07-10 LAB — CA 125: CA 125: 4.8

## 2011-11-23 ENCOUNTER — Other Ambulatory Visit (HOSPITAL_COMMUNITY): Payer: Self-pay | Admitting: Gynecology

## 2011-11-23 DIAGNOSIS — Z13820 Encounter for screening for osteoporosis: Secondary | ICD-10-CM

## 2011-11-24 ENCOUNTER — Ambulatory Visit (HOSPITAL_COMMUNITY)
Admission: RE | Admit: 2011-11-24 | Discharge: 2011-11-24 | Disposition: A | Discharge status: Home / Self Care | Payer: 59 | Source: Ambulatory Visit | Attending: Gynecology | Admitting: Gynecology

## 2011-11-24 DIAGNOSIS — Z13820 Encounter for screening for osteoporosis: Secondary | ICD-10-CM | POA: Insufficient documentation

## 2011-11-24 DIAGNOSIS — Z780 Asymptomatic menopausal state: Secondary | ICD-10-CM | POA: Insufficient documentation

## 2012-03-08 ENCOUNTER — Other Ambulatory Visit (HOSPITAL_COMMUNITY): Payer: Self-pay | Admitting: Gynecology

## 2012-03-08 DIAGNOSIS — Z1231 Encounter for screening mammogram for malignant neoplasm of breast: Secondary | ICD-10-CM

## 2012-03-14 ENCOUNTER — Ambulatory Visit (HOSPITAL_COMMUNITY)
Admission: RE | Admit: 2012-03-14 | Discharge: 2012-03-14 | Disposition: A | Discharge status: Home / Self Care | Payer: 59 | Source: Ambulatory Visit | Attending: Gynecology | Admitting: Gynecology

## 2012-03-14 DIAGNOSIS — Z1231 Encounter for screening mammogram for malignant neoplasm of breast: Secondary | ICD-10-CM | POA: Insufficient documentation

## 2013-05-14 ENCOUNTER — Other Ambulatory Visit (HOSPITAL_COMMUNITY): Payer: Self-pay | Admitting: Gynecology

## 2013-05-14 DIAGNOSIS — Z1231 Encounter for screening mammogram for malignant neoplasm of breast: Secondary | ICD-10-CM

## 2013-05-15 ENCOUNTER — Ambulatory Visit (HOSPITAL_COMMUNITY)
Admission: RE | Admit: 2013-05-15 | Discharge: 2013-05-15 | Disposition: A | Discharge status: Home / Self Care | Payer: 59 | Source: Ambulatory Visit | Attending: Gynecology | Admitting: Gynecology

## 2013-05-15 DIAGNOSIS — Z1231 Encounter for screening mammogram for malignant neoplasm of breast: Secondary | ICD-10-CM | POA: Insufficient documentation

## 2013-10-28 ENCOUNTER — Telehealth: Payer: Self-pay | Admitting: Internal Medicine

## 2013-10-28 NOTE — Telephone Encounter (Signed)
Left message at the below listed number.  She will need to come in and be seen.  Not seen in 3+ years.

## 2013-10-28 NOTE — Telephone Encounter (Signed)
Patient Information:  Caller Name: Riah  Phone: [phone]  Patient: Akeiba, Axelson  Gender: Female  DOB: 23-Nov-1955  Age: 58 Years  PCP: Shanon Ace (Family Practice)  Office Follow Up:  Does the office need to follow up with this patient?: Yes  Instructions For The Office: Medication request  RN Note:  Patient is calling regarding tenderness & pain "in nose".  Denies swelling, drainage or redness.  States this has happened before and office has cultured it.  Patient is requesting an Antibiotic be called to [facility] [phone].  Desires call back.  Symptoms  Reason For Call & Symptoms: infection in nose - painful  Reviewed Health History In EMR: Yes  Reviewed Medications In EMR: Yes  Reviewed Allergies In EMR: Yes  Reviewed Surgeries / Procedures: Yes  Date of Onset of Symptoms: 10/26/2013  Guideline(s) Used:  Wound Infection  Disposition Per Guideline:   Go to Office Now  Reason For Disposition Reached:   Severe pain in the wound  Advice Given:  Call Back If:   You become worse  Patient Refused Recommendation:  Patient Requests Prescription  Requesting antibiotic

## 2013-10-29 NOTE — Telephone Encounter (Signed)
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  Needed an early morning appt.  Not seen in 3+ yrs.  Pt made appt for 10/31/13 to reestablish and address acute issue.

## 2013-10-31 ENCOUNTER — Ambulatory Visit (INDEPENDENT_AMBULATORY_CARE_PROVIDER_SITE_OTHER): Payer: 59 | Admitting: Internal Medicine

## 2013-10-31 ENCOUNTER — Encounter: Payer: Self-pay | Admitting: Internal Medicine

## 2013-10-31 VITALS — BP 128/80 | HR 81 | Temp 98.9°F | Ht 65.75 in | Wt 128.0 lb

## 2013-10-31 DIAGNOSIS — J3489 Other specified disorders of nose and nasal sinuses: Secondary | ICD-10-CM

## 2013-10-31 DIAGNOSIS — L989 Disorder of the skin and subcutaneous tissue, unspecified: Secondary | ICD-10-CM

## 2013-10-31 DIAGNOSIS — F17200 Nicotine dependence, unspecified, uncomplicated: Secondary | ICD-10-CM | POA: Insufficient documentation

## 2013-10-31 DIAGNOSIS — Z23 Encounter for immunization: Secondary | ICD-10-CM

## 2013-10-31 DIAGNOSIS — R87619 Unspecified abnormal cytological findings in specimens from cervix uteri: Secondary | ICD-10-CM | POA: Insufficient documentation

## 2013-10-31 DIAGNOSIS — G4729 Other circadian rhythm sleep disorder: Secondary | ICD-10-CM

## 2013-10-31 DIAGNOSIS — H409 Unspecified glaucoma: Secondary | ICD-10-CM

## 2013-10-31 MED ORDER — CEPHALEXIN 500 MG PO CAPS: 500.0000 mg | ORAL_CAPSULE | Freq: Two times a day (BID) | ORAL | Status: DC

## 2013-10-31 MED ORDER — MUPIROCIN CALCIUM 2 % EX CREA: 1.0000 "application " | TOPICAL_CREAM | Freq: Two times a day (BID) | CUTANEOUS | Status: DC

## 2013-10-31 NOTE — Patient Instructions (Signed)
Antibiotic  Nasal and antibiotic  Will let you know results of culture when back Have dr Tonia Brooms see the skin lesion. Stopping tobacco  Is good for your health.

## 2013-10-31 NOTE — Progress Notes (Signed)
Chief Complaint  Patient presents with  . Establish Care    Has a left nostril problem.  She is concerned it could be MRSA.  Has a patch on the back of her head that she is concerned about.    HPI: Patient comes in as new patient visit  resestablish. Previous care was  Years back Sees  Dr Mexer gyne and Runner, broadcasting/film[employer]video.   ONset recnetly sore left nostril off and on . Has hadthtisbefore  Remote hxof nasal infection mssa  And then urgen care  Impetigo rx with antibiotic s To see dr Tonia Brooms   Knot back of head some pain . There for a while no trauma fever  ROS: See pertinent positives and negatives per HPI.nocv pulm except with resp gi gu mood stable  glucoma and cataracts  Dr Herbert Deaner  To go on eye drops.   Past Medical History  Diagnosis Date  . Glaucoma   . Chicken pox   . Ingestion of substance     3 04  . Hamstring injury     torn  . Hx of thyroid disease     3rd grade  resolved   . Shift work sleep disorder     Family History  Problem Relation Age of Onset  . Cancer Mother 21    Died at 46 of Non-Hodgkins Lymphoma  . Alcohol abuse Father   . COPD Father   . Depression Father   . Hyperlipidemia Father   . Hypertension Father     deceased  . Ovarian cancer Sister   . COPD Maternal Aunt   . Stroke Maternal Grandmother   . Cancer Maternal Grandfather     Brain Cancer  . Arthritis Paternal Grandmother   . Cancer Paternal Grandmother     Breast Cancer  . Depression Paternal Grandmother   . Glaucoma Paternal Grandmother   . Cancer Paternal Grandfather     Renal Cancer  . Lymphoma Mother     66    History   Social History  . Marital Status: Married    Spouse Name: N/A    Number of Children: N/A  . Years of Education: N/A   Social History Main Topics  . Smoking status: Current Every Day Smoker  . Smokeless tobacco: None  . Alcohol Use: No  . Drug Use: No  . Sexual Activity: None   Other Topics Concern  . None   Social History Narrative   Averages  5-8 hours of sleep per day.   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   P0G0          Outpatient Encounter Prescriptions as of 10/31/2013  Medication Sig  . Armodafinil (NUVIGIL) 150 MG tablet Take 150 mg by mouth daily.  . DULoxetine (CYMBALTA) 30 MG capsule Take 30 mg by mouth daily.  . DULoxetine (CYMBALTA) 60 MG capsule Take 60 mg by mouth daily.  Marland Kitchen gabapentin (NEURONTIN) 300 MG capsule Take 300 mg by mouth 2 (two) times daily.  . QUEtiapine (SEROQUEL) 25 MG tablet Take 25 mg by mouth at bedtime.  Marland Kitchen zolpidem (AMBIEN) 10 MG tablet Take 10 mg by mouth at bedtime as needed for sleep.  . cephALEXin (KEFLEX) 500 MG capsule Take 1 capsule (500 mg total)  by mouth 2 (two) times daily.  . mupirocin cream (BACTROBAN) 2 % Apply 1 application topically 2 (two) times daily.    EXAM:  BP 128/80  Pulse 81  Temp(Src) 98.9 F (37.2 C) (Oral)  Ht 5' 5.75" (1.67 m)  Wt 128 lb (58.06 kg)  BMI 20.82 kg/m2  SpO2 98%  Body mass index is 20.82 kg/(m^2).  GENERAL: vitals reviewed and listed above, alert, oriented, appears well hydrated and in no acute distress HEENT: atraumatic, conjunctiva  clear, no obvious abnormalities on inspection of external nose and ears TMs intact left nostril irritated redness on the inside but no crusting or vesicle. OP : no lesion edema or exudate wears glasses NECK: no obvious masses on inspection palpation no adenopathy obvious LUNGS: clear to auscultation bilaterally, no wheezes, rales or rhonchi,  Skin area left occipital area tiny nodule like a reactive lymph node with overlying skin irregular redness slight plaque no bleeding not really tender CV: HRRR, no clubbing cyanosis or  peripheral edema nl cap refill  MS: moves all extremities without noticeable focal  abnormality PSYCH: pleasant and cooperative, no obvious  depression  ASSESSMENT AND PLAN:  Discussed the following assessment and plan:  Infection of nose - hx of same  mild but works  with infants at [employer]  - Plan: Nasal culture  Glaucoma  Abnormal Pap smear of cervix - resolved  followed dr Carren Rang  Tobacco dependence - Counseled  Need for vaccination with 13-polyvalent pneumococcal conjugate vaccine - Plan: Pneumococcal conjugate vaccine 13-valent  Scalp lesion - with underlying induration see derm  -Patient advised to return or notify health care team  if symptoms worsen or persist or new concerns arise.  Patient Instructions  Antibiotic  Nasal and antibiotic  Will let you know results of culture when back Have dr Tonia Brooms see the skin lesion. Stopping tobacco  Is good for your health.        Standley Brooking. Jaedin Regina M.D.  Pre visit review using our clinic review tool, if applicable. No additional management support is needed unless otherwise documented below in the visit note.

## 2013-11-03 ENCOUNTER — Telehealth: Payer: Self-pay | Admitting: Internal Medicine

## 2013-11-03 LAB — NASAL CULTURE (N/P): Organism ID, Bacteria: NORMAL

## 2013-11-03 NOTE — Telephone Encounter (Signed)
Relevant patient education mailed to patient.  

## 2013-11-05 ENCOUNTER — Encounter: Payer: Self-pay | Admitting: Family Medicine

## 2013-11-09 ENCOUNTER — Encounter: Payer: Self-pay | Admitting: Internal Medicine

## 2013-11-09 DIAGNOSIS — L989 Disorder of the skin and subcutaneous tissue, unspecified: Secondary | ICD-10-CM | POA: Insufficient documentation

## 2014-05-20 ENCOUNTER — Other Ambulatory Visit (HOSPITAL_COMMUNITY): Payer: Self-pay | Admitting: Gynecology

## 2014-05-20 DIAGNOSIS — Z780 Asymptomatic menopausal state: Secondary | ICD-10-CM

## 2014-05-20 DIAGNOSIS — Z8739 Personal history of other diseases of the musculoskeletal system and connective tissue: Secondary | ICD-10-CM

## 2014-05-20 DIAGNOSIS — Z1231 Encounter for screening mammogram for malignant neoplasm of breast: Secondary | ICD-10-CM

## 2014-05-21 ENCOUNTER — Ambulatory Visit (HOSPITAL_COMMUNITY)
Admission: RE | Admit: 2014-05-21 | Discharge: 2014-05-21 | Disposition: A | Discharge status: Home / Self Care | Payer: 59 | Source: Ambulatory Visit | Attending: Gynecology | Admitting: Gynecology

## 2014-05-21 DIAGNOSIS — Z1231 Encounter for screening mammogram for malignant neoplasm of breast: Secondary | ICD-10-CM | POA: Insufficient documentation

## 2014-05-28 ENCOUNTER — Ambulatory Visit (HOSPITAL_COMMUNITY)
Admission: RE | Admit: 2014-05-28 | Discharge: 2014-05-28 | Disposition: A | Discharge status: Home / Self Care | Payer: 59 | Source: Ambulatory Visit | Attending: Gynecology | Admitting: Gynecology

## 2014-05-28 DIAGNOSIS — Z8739 Personal history of other diseases of the musculoskeletal system and connective tissue: Secondary | ICD-10-CM

## 2014-05-28 DIAGNOSIS — Z780 Asymptomatic menopausal state: Secondary | ICD-10-CM

## 2014-05-28 DIAGNOSIS — Z13820 Encounter for screening for osteoporosis: Secondary | ICD-10-CM | POA: Diagnosis present

## 2015-01-06 ENCOUNTER — Telehealth: Payer: Self-pay | Admitting: Internal Medicine

## 2015-01-06 NOTE — Telephone Encounter (Deleted)
pt

## 2015-01-07 NOTE — Telephone Encounter (Signed)
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 No particular day.

## 2015-01-08 NOTE — Telephone Encounter (Signed)
Not this Monday

## 2015-01-08 NOTE — Telephone Encounter (Signed)
Already ahead of you, scheduled the cpe while on the phone and discussed pna!!!

## 2015-01-08 NOTE — Telephone Encounter (Signed)
Did pt not want to have lab work before her visit?  Is she getting pneumo vaccine same day?

## 2015-01-08 NOTE — Telephone Encounter (Signed)
Patient not seen in over one year.  Needs wellness.  Please see if the pt will schedule.  Thanks!

## 2015-01-08 NOTE — Telephone Encounter (Addendum)
Pt called back now and decides she wants to see dr Regis Bill about some anxiety issues she is having. Wants to discuss psychiatrist preferences too. She has a psychiatrist she wants to see, but they are not available until sept 2016. Would like to get dr Regis Bill opinion. But needs am appt due to work schedule. Only the 8:45 on Monday well child.  pls advise if ok to use.

## 2015-01-11 NOTE — Telephone Encounter (Signed)
Ok pt has been scheduled.

## 2015-01-22 ENCOUNTER — Ambulatory Visit (INDEPENDENT_AMBULATORY_CARE_PROVIDER_SITE_OTHER): Payer: 59 | Admitting: Internal Medicine

## 2015-01-22 ENCOUNTER — Encounter: Payer: Self-pay | Admitting: Internal Medicine

## 2015-01-22 VITALS — BP 158/86 | Temp 98.6°F | Ht 65.0 in | Wt 131.0 lb

## 2015-01-22 DIAGNOSIS — F17200 Nicotine dependence, unspecified, uncomplicated: Secondary | ICD-10-CM | POA: Diagnosis not present

## 2015-01-22 DIAGNOSIS — Z23 Encounter for immunization: Secondary | ICD-10-CM

## 2015-01-22 DIAGNOSIS — R030 Elevated blood-pressure reading, without diagnosis of hypertension: Secondary | ICD-10-CM | POA: Diagnosis not present

## 2015-01-22 DIAGNOSIS — F411 Generalized anxiety disorder: Secondary | ICD-10-CM

## 2015-01-22 DIAGNOSIS — IMO0001 Reserved for inherently not codable concepts without codable children: Secondary | ICD-10-CM

## 2015-01-22 MED ORDER — LORAZEPAM 1 MG PO TABS
ORAL_TABLET | ORAL | Status: DC
Start: 2015-01-22 — End: 2016-10-19

## 2015-01-22 MED ORDER — LISINOPRIL 5 MG PO TABS: 5.0000 mg | ORAL_TABLET | Freq: Every day | ORAL | Status: DC

## 2015-01-22 NOTE — Progress Notes (Signed)
Pre visit review using our clinic review tool, if applicable. No additional management support is needed unless otherwise documented below in the visit note.  Chief Complaint  Patient presents with  . Anxiety    Now considered out of network with psychiatrist.  Would like to see Dr. Toy Care but no new appt until Sept.    HPI: Jillene Bucks 59 y.o. requested appt to discuss anxiety until can see [id number] .. 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 New provider didn't seem to respond to her request for anxiety med reevaluation. Also no longer in network und. Looking for psych  female dr Toy Care no appt until September .   On cancellation list   Anxiety and bp is high .    ? See some one meds dont work as well.  Last check with  Gala Murdoch .   Told having a lot of anxiety and feels overwhelmed  . Not handling well at this time.  Hasn't   nuvigil   1/ 2 pre work  Seroquel.    Used for sleep  And had fogginess  Had to stop.  ambien : Prn 10 mg pre sleep. Not every day  Tobacco 1/2 - 3/4 per day . 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  buspar  30 0nce  Per day .  Bp was up at dr Carren Rang also  ? 140s or so  Stress  More recently asks for something for anxiety or other  ROS: See pertinent positives and negatives per HPI. Denies cp sob  Other new sx  Same job stress  Care otherwhere   Past Medical History  Diagnosis Date  . Glaucoma   . Chicken pox   . Ingestion of substance     3 04  . Hamstring injury     torn  . Hx of thyroid disease     3rd grade  resolved   . Shift work sleep disorder     Family History  Problem Relation Age of Onset  . Cancer Mother 68    Died at 72 of Non-Hodgkins Lymphoma  . Alcohol abuse Father   . COPD Father   . Depression Father   . Hyperlipidemia Father   . Hypertension Father     deceased    . Ovarian cancer Sister   . COPD Maternal Aunt   . Stroke Maternal Grandmother   . Cancer Maternal Grandfather     Brain Cancer  . Arthritis Paternal Grandmother   . Cancer Paternal Grandmother     Breast Cancer  . Depression Paternal Grandmother   . Glaucoma Paternal Grandmother   . Cancer Paternal Grandfather     Renal Cancer  . Lymphoma Mother     39    History   Social History  . Marital Status: Married    Spouse Name: N/A  . Number of Children: N/A  . Years of Education: N/A   Social History Main Topics  . Smoking status: Current Every Day Smoker  . Smokeless tobacco: Not on file  . Alcohol Use: No  . Drug Use: No  . Sexual Activity: Not on file   Other Topics Concern  . None   Social History Narrative   Averages 5-8 hours of sleep per day.  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   P0G0          Outpatient Encounter Prescriptions as of 01/22/2015  Medication Sig  . Armodafinil (NUVIGIL) 150 MG tablet Take 150 mg by mouth daily.  . busPIRone (BUSPAR) 30 MG tablet Take 30 mg by mouth daily.  . DULoxetine (CYMBALTA) 30 MG capsule Take 30 mg by mouth daily.  . DULoxetine (CYMBALTA) 60 MG capsule Take 60 mg by mouth daily.  Marland Kitchen gabapentin (NEURONTIN) 300 MG capsule Take 300 mg by mouth 2 (two) times daily.  Marland Kitchen zolpidem (AMBIEN) 10 MG tablet Take 10 mg by mouth at bedtime as needed for sleep.  Marland Kitchen lisinopril (PRINIVIL,ZESTRIL) 5 MG tablet Take 1 tablet (5 mg total) by mouth daily.  Marland Kitchen LORazepam (ATIVAN) 1 MG tablet Can take for acute anxiety .  As needed bid  . [DISCONTINUED] cephALEXin (KEFLEX) 500 MG capsule Take 1 capsule (500 mg total) by mouth 2 (two) times daily.  . [DISCONTINUED] mupirocin cream (BACTROBAN) 2 % Apply 1 application topically 2 (two) times daily.  . [DISCONTINUED] QUEtiapine (SEROQUEL) 25 MG  tablet Take 25 mg by mouth at bedtime.    EXAM:  BP 158/86 mmHg  Temp(Src) 98.6 F (37 C) (Oral)  Ht 5\' 5"  (1.651 m)  Wt 131 lb (59.421 kg)  BMI 21.80 kg/m2  Body mass index is 21.8 kg/(m^2).  GENERAL: vitals reviewed and listed above, alert, oriented, appears well hydrated and in no acute distress anxious abut nl attention  No  Tremor  HEENT: atraumatic, conjunctiva  clear, no obvious abnormalities on inspection of external nose and ears glassesNECK: no obvious masses on inspection palpation  CV: HRRR, no clubbing cyanosis or  peripheral edema nl cap refill  Repeat bp readings 158/86 MS: moves all extremities without noticeable focal  abnormality PSYCH:  anxious nl speech otherwise   cognition seems ok    ASSESSMENT AND PLAN:  Discussed the following assessment and plan:  Generalized anxiety disorder - under psych care  needs reeval insuance issues   Tobacco dependence - still advise stopping   Need for 23-polyvalent pneumococcal polysaccharide vaccine - Plan: Pneumococcal polysaccharide vaccine 23-valent greater than or equal to 2yo subcutaneous/IM  Elevated BP - prob ht  at baseline  by hx  fam hx     trial lisinopril and fu 6-8 weeks I Do not  prescribe chronic benzos    For gad .    Advise specialty care in her situation.  She is disappointed bout this  Disc rescue vs chronic  management  Do not think in best interest    Given small amount of ativan if needed for as needed    . She may need her meds rearranged  Advised counseling  Also she is reluctant to do this  Not  Convinced but think she could benefit  . Spent a good deal of time  About ideas for help .  havent been involved in her routine health care for over 4 years   Sees [facility]  And told bp up some at visits  Add bp medication   Unless bp documented nl  rov in 6-8 weeks for bp   If gets severe may consider seeking acute behavioral health  Assessment Total visit 40  mins > 50% spent counseling and coordinating care as  indicated in above note anxiety rx and plan  And ht rx and plan  and in instructions to patient .  -Patient advised to return or notify health care team  if symptoms worsen ,persist or new concerns arise.  Patient Instructions  Your blood pressure is hight today and needs evaluation  If continues high.  Should  Treat blood pressure   As it is up also. benzos are used for acute rescue .    Counseling should be a regular part of your therapy plan ..for reasons said .    Standley Brooking. Panosh M.D.

## 2015-01-22 NOTE — Patient Instructions (Addendum)
Your blood pressure is hight today and needs evaluation  If continues high.  Should  Treat blood pressure   As it is up also. benzos are used for acute rescue .    Counseling should be a regular part of your therapy plan ..for reasons said .

## 2015-02-11 ENCOUNTER — Telehealth: Payer: Self-pay | Admitting: Internal Medicine

## 2015-02-11 NOTE — Telephone Encounter (Signed)
Spoke to the pt.  She informed me that her bp was 160/95 last night.  Told me she only takes her blood pressure when she feels that it is high.  Instructed her to take her bp 2-3 x  daily for the next week.  She works third shift.  Will try to take her bp when she gets to work and before she leaves.  She has an upcoming appt on the 10th.  Also informed me that she has not been taking her bp medication the same time every day.  She will try to start taking in at 9 PM everyday.  She goes back to work on Tuesday and will start getting bp reading.  Will bring her reading with her on the 10.

## 2015-02-11 NOTE — Telephone Encounter (Signed)
Pt called to say that she has been checking her bp and it is still running high and it is giving her a headache. Pt will monitor her bp the next several days and if continue to run high will make an appt

## 2015-02-11 NOTE — Telephone Encounter (Signed)
Agree with above  Also increase dose to 10 mg ( take 2 -5 mg  Qd  ) keep  Fu .  03/05/2015

## 2015-02-12 NOTE — Telephone Encounter (Signed)
Gave pt info per [place] and she said ok

## 2015-02-12 NOTE — Telephone Encounter (Signed)
LM for the pt to return my call. 

## 2015-03-04 ENCOUNTER — Encounter: Payer: 59 | Admitting: Genetic Counselor

## 2015-03-04 ENCOUNTER — Other Ambulatory Visit: Payer: 59

## 2015-03-05 ENCOUNTER — Ambulatory Visit (INDEPENDENT_AMBULATORY_CARE_PROVIDER_SITE_OTHER): Payer: 59 | Admitting: Internal Medicine

## 2015-03-05 ENCOUNTER — Encounter: Payer: Self-pay | Admitting: Internal Medicine

## 2015-03-05 VITALS — BP 136/96 | Temp 98.0°F | Ht 65.0 in | Wt 129.8 lb

## 2015-03-05 DIAGNOSIS — T464X5A Adverse effect of angiotensin-converting-enzyme inhibitors, initial encounter: Secondary | ICD-10-CM

## 2015-03-05 DIAGNOSIS — I10 Essential (primary) hypertension: Secondary | ICD-10-CM

## 2015-03-05 DIAGNOSIS — R058 Other specified cough: Secondary | ICD-10-CM

## 2015-03-05 DIAGNOSIS — H6192 Disorder of left external ear, unspecified: Secondary | ICD-10-CM | POA: Diagnosis not present

## 2015-03-05 DIAGNOSIS — R05 Cough: Secondary | ICD-10-CM

## 2015-03-05 DIAGNOSIS — H60392 Other infective otitis externa, left ear: Secondary | ICD-10-CM

## 2015-03-05 MED ORDER — DOXYCYCLINE HYCLATE 100 MG PO TABS: 100.0000 mg | ORAL_TABLET | Freq: Two times a day (BID) | ORAL | Status: DC

## 2015-03-05 MED ORDER — LOSARTAN POTASSIUM 100 MG PO TABS: 100.0000 mg | ORAL_TABLET | Freq: Every day | ORAL | Status: DC

## 2015-03-05 NOTE — Progress Notes (Signed)
Pre visit review using our clinic review tool, if applicable. No additional management support is needed unless otherwise documented below in the visit note.  Chief Complaint  Patient presents with  . Follow-up    Average bp 152/86    HPI: Comes in for follow up of  hypertension elevated blood pressure  HT : average  In past weeks   After upping med  152/86  has all more on cuff and sometimes gets checked at work No problem meds . She is now been taking 10 mg of lisinopril for at least a month.Does have a tickle cough at this time no shortness of breath No other se .  GAD went back and saw her previous practitioner because of the delayed appointment her meds were changed.  Asked to look at her left ear Scratched and bleed  a lot without any trauma Very sore  Cant see i there for about a month. It is painful no itching never had this before ROS: See pertinent positives and negatives per HPI. No current chest pain shortness of breath.  Past Medical History  Diagnosis Date  . Glaucoma   . Chicken pox   . Ingestion of substance     3 04  . Hamstring injury     torn  . Hx of thyroid disease     3rd grade  resolved   . Shift work sleep disorder     Family History  Problem Relation Age of Onset  . Cancer Mother 49    Died at 65 of Non-Hodgkins Lymphoma  . Alcohol abuse Father   . COPD Father   . Depression Father   . Hyperlipidemia Father   . Hypertension Father     deceased  . Ovarian cancer Sister   . COPD Maternal Aunt   . Stroke Maternal Grandmother   . Cancer Maternal Grandfather     Brain Cancer  . Arthritis Paternal Grandmother   . Cancer Paternal Grandmother     Breast Cancer  . Depression Paternal Grandmother   . Glaucoma Paternal Grandmother   . Cancer Paternal Grandfather     Renal Cancer  . Lymphoma Mother     27    History   Social History  . Marital Status: Married    Spouse Name: N/A  . Number of Children: N/A  . Years of Education: N/A    Social History Main Topics  . Smoking status: Current Every Day Smoker  . Smokeless tobacco: Not on file  . Alcohol Use: No  . Drug Use: No  . Sexual Activity: Not on file   Other Topics Concern  . None   Social History Narrative   Averages 5-8 hours of sleep per day.   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   P0G0          Outpatient Prescriptions Prior to Visit  Medication Sig Dispense Refill  . Armodafinil (NUVIGIL) 150 MG tablet Take 150 mg by mouth daily.    . busPIRone (BUSPAR) 30 MG tablet Take 30 mg by mouth daily.    . DULoxetine (CYMBALTA) 30 MG capsule Take 30 mg by mouth daily.    . DULoxetine (CYMBALTA) 60 MG capsule Take 60 mg by mouth daily.    Marland Kitchen  gabapentin (NEURONTIN) 300 MG capsule Take 300 mg by mouth 2 (two) times daily.    Marland Kitchen LORazepam (ATIVAN) 1 MG tablet Can take for acute anxiety .  As needed bid 20 tablet 0  . zolpidem (AMBIEN) 10 MG tablet Take 10 mg by mouth at bedtime as needed for sleep.    Marland Kitchen lisinopril (PRINIVIL,ZESTRIL) 5 MG tablet Take 1 tablet (5 mg total) by mouth daily. (Patient taking differently: Take 10 mg by mouth daily. ) 90 tablet 1   No facility-administered medications prior to visit.     EXAM:  BP 136/96 mmHg  Temp(Src) 98 F (36.7 C) (Oral)  Ht 5\' 5"  (1.651 m)  Wt 129 lb 12.8 oz (58.877 kg)  BMI 21.60 kg/m2  Body mass index is 21.6 kg/(m^2).  GENERAL: vitals reviewed and listed above, alert, oriented, appears well hydrated and in no acute distress has an occasional dry upper airway cough HEENT: atraumatic, conjunctiva  clear, no obvious abnormalities on inspection of external nose   Left pinna superiorly there is what looks like ulcer with exudate with no blood was some mild erythema around it no swelling no purulent discharge. OP : no lesion edema or exudate  NECK: no obvious  masses on inspection palpation  LUNGS: clear to auscultation bilaterally, no wheezes, rales or rhonchi,  CV: HRRR, no clubbing cyanosis or  peripheral edema nl cap refill   PSYCH: pleasant and cooperative, no obvious depression or anxiety normal speech and thought looks more relaxed today. BP Readings from Last 3 Encounters:  03/05/15 136/96  01/22/15 158/86  10/31/13 128/80   Wt Readings from Last 3 Encounters:  03/05/15 129 lb 12.8 oz (58.877 kg)  01/22/15 131 lb (59.421 kg)  10/31/13 128 lb (58.06 kg)     ASSESSMENT AND PLAN:  Discussed the following assessment and plan:  Essential hypertension  ACE-inhibitor cough  Infection, pinna, acute, left ? ulcer like lesion - no trauma looks like an ulcer  crusted add antibiotic top and oral fu derm if persistent  On 10 mg per day     prob acecough  And can change to arb.   consider add diiurteic if needed sign up for my chart  Would see dermatology if the ear areas not improved concern for underlying lesion. Because duration of lesion per history. Okay to do CPX labs with HIV early after switching blood pressure medication and not have to wait for her checkup appointment. -Patient advised to return or notify health care team  if symptoms worsen ,persist or new concerns arise.  Patient Instructions  Because you have a cough .    prob from aceI . At renewal then  Change .   To losartan 100 mg . Per day  And cough should subside.   Then continue to moinitor and send in readings in a month . If ok   Get cpx labs ( early)  8 hours fast water ok . as discussed ...  Topical antibiotic    Local care and add antibiotic   If not better in a week or so then  See dermatology .      Standley Brooking. Panosh M.D.   Medication List       This list is accurate as of: 03/05/15  1:05 PM.  Always use your most recent med list.               busPIRone 30 MG tablet  Commonly known as:  BUSPAR  Take 30 mg by mouth  daily.     doxycycline 100  MG tablet  Commonly known as:  VIBRA-TABS  Take 1 tablet (100 mg total) by mouth 2 (two) times daily.     DULoxetine 60 MG capsule  Commonly known as:  CYMBALTA  Take 60 mg by mouth daily.     DULoxetine 30 MG capsule  Commonly known as:  CYMBALTA  Take 30 mg by mouth daily.     gabapentin 300 MG capsule  Commonly known as:  NEURONTIN  Take 300 mg by mouth 2 (two) times daily.     hydrOXYzine 10 MG tablet  Commonly known as:  ATARAX/VISTARIL  take 1 to 2 tablets by mouth twice a day if needed for anxiety     LORazepam 1 MG tablet  Commonly known as:  ATIVAN  Can take for acute anxiety .  As needed bid     losartan 100 MG tablet  Commonly known as:  COZAAR  Take 1 tablet (100 mg total) by mouth daily.     NUVIGIL 150 MG tablet  Generic drug:  Armodafinil  Take 150 mg by mouth daily.     REXULTI 1 MG Tabs  Generic drug:  Brexpiprazole  Take 1 mg by mouth daily.     zolpidem 10 MG tablet  Commonly known as:  AMBIEN  Take 10 mg by mouth at bedtime as needed for sleep.

## 2015-03-05 NOTE — Patient Instructions (Signed)
Because you have a cough .    prob from aceI . At renewal then  Change .   To losartan 100 mg . Per day  And cough should subside.   Then continue to moinitor and send in readings in a month . If ok   Get cpx labs ( early)  8 hours fast water ok . as discussed ...  Topical antibiotic    Local care and add antibiotic   If not better in a week or so then  See dermatology .

## 2015-03-25 ENCOUNTER — Encounter: Payer: Self-pay | Admitting: Genetic Counselor

## 2015-03-25 ENCOUNTER — Ambulatory Visit (HOSPITAL_BASED_OUTPATIENT_CLINIC_OR_DEPARTMENT_OTHER): Payer: 59 | Admitting: Genetic Counselor

## 2015-03-25 ENCOUNTER — Other Ambulatory Visit: Payer: 59

## 2015-03-25 DIAGNOSIS — Z803 Family history of malignant neoplasm of breast: Secondary | ICD-10-CM

## 2015-03-25 DIAGNOSIS — Z807 Family history of other malignant neoplasms of lymphoid, hematopoietic and related tissues: Secondary | ICD-10-CM

## 2015-03-25 DIAGNOSIS — Z800 Family history of malignant neoplasm of digestive organs: Secondary | ICD-10-CM

## 2015-03-25 DIAGNOSIS — Z8041 Family history of malignant neoplasm of ovary: Secondary | ICD-10-CM | POA: Diagnosis not present

## 2015-03-25 DIAGNOSIS — Z8481 Family history of carrier of genetic disease: Secondary | ICD-10-CM | POA: Diagnosis not present

## 2015-03-25 DIAGNOSIS — Z8051 Family history of malignant neoplasm of kidney: Secondary | ICD-10-CM

## 2015-03-25 DIAGNOSIS — Z8049 Family history of malignant neoplasm of other genital organs: Secondary | ICD-10-CM

## 2015-03-25 DIAGNOSIS — Z8489 Family history of other specified conditions: Secondary | ICD-10-CM

## 2015-03-25 DIAGNOSIS — Z808 Family history of malignant neoplasm of other organs or systems: Secondary | ICD-10-CM

## 2015-03-25 NOTE — Progress Notes (Signed)
REFERRING PROVIDER:   PRIMARY PROVIDER:  Lottie Dawson, MD  PRIMARY REASON FOR VISIT:  1. Family history of genetic disorder   2. Family history of colon cancer   3. Family history of ovarian cancer   4. Family history of breast cancer in female   65. Family history of renal cancer   6. Family history of brain cancer   7. Family history of uterine cancer   8. Family history of non-Hodgkin's lymphoma   9. Family history of multiple myeloma      HISTORY OF PRESENT ILLNESS:   Jasmine Nelson, a 59 y.o. female, was seen for a [place] cancer genetics consultation due to a family history of a CHEK2 mutation in her sister, as well as for a family history of colon and other cancers.  Jasmine Nelson presents to clinic today to discuss the possibility of having inherited this same mutation in her CHEK2, genetic testing, and to further clarify her future cancer risks, as well as potential cancer risks for family members.   Jasmine Nelson is a 59 y.o. female with no personal history of cancer. She has a past history of a benign endometrial polyp, found in September of 2002 and an ovarian cyst discovered in 2012.  She has previously had elevated CA-125 tests with follow-up normal results, and she still has her ovaries as well as her uterus.   CANCER HISTORY:   No history exists.  2012 - ovarian cyst  September 2002- benign endometrial polyp   HORMONAL RISK FACTORS:  Menarche was at age 54-12.  First live birth at age - no children  OCP use for approximately less than 6 months on patch. Ovaries intact: yes.  Hysterectomy: no.  Menopausal status: postmenopausal.  HRT use: 0 years. Colonoscopy: yes; previously fine, but is overdue for another. Mammogram within the last year: yes. Number of breast biopsies: 0. Up to date with pelvic exams:  yes. 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 Glaucoma   . Chicken pox   . Ingestion of substance     3 04  . Hamstring injury     torn  . Hx of thyroid disease     3rd grade  resolved   . Shift work sleep disorder     Past Surgical History  Procedure Laterality Date  . Adenoidectomy  1959    History   Social History  . Marital Status: Married    Spouse Name: N/A  . Number of Children: N/A  . Years of Education: N/A   Social History Main Topics  . Smoking status: Current Every Day Smoker -- 1.00 packs/day    Types: Cigarettes  . Smokeless tobacco: Not on file  . Alcohol Use: No  . Drug Use: No  . Sexual Activity: Not on file   Other Topics Concern  . None   Social History Narrative   Averages 5-8 hours of sleep per day.   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   P0G0           FAMILY HISTORY:  We obtained a detailed, 4-generation family  history.  Significant diagnoses are listed below: Family History  Problem Relation Age of Onset  . Cancer Mother 71    Died at 74 of Non-Hodgkins Lymphoma  . Lymphoma Mother     60  . Colon polyps Mother     "many"; required regular colonoscopies  . Alcohol abuse Father   . COPD Father   . Depression Father   . Hyperlipidemia Father   . Hypertension Father     deceased  . Colon polyps Father     "many"; required regular colonoscopies  . Colon cancer Sister     dx. 62-56  . Other Sister     +CHEK2 mutation  . COPD Maternal Aunt   . Ovarian cancer Maternal Aunt 62  . Stroke Maternal Grandmother   . Heart Problems Maternal Grandmother   . Cancer Maternal Grandfather 45    Brain Cancer  . Arthritis Paternal Grandmother   . Depression Paternal Grandmother   . Glaucoma Paternal Grandmother   . Breast cancer Paternal Grandmother     dx. 36s  . Uterine cancer  Paternal Grandmother     dx. 72s  . Renal cancer Paternal Grandfather     smoker  . Multiple myeloma Maternal Uncle     dx. well before his 29s  . Ovarian cancer Other    Ms. Geeslin's sister was diagnosed with colon cancer at the age of 62.  Soon after, she had genetic testing via [employer].  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  To Ms. Varnell's knowledge neither her nephew or niece have had genetic testing for this mutation yet.  Ms. Bonifas herself has no children.  Her mother passed away at 33; she had a history of non-Hodgkin's lymphoma and reportedly lots of colon polyps requiring regular colonoscopies.  Ms. Biel has one living maternal uncle who has not had cancer.  One maternal uncle had multiple myeloma and passed away in his 68s.  A maternal aunt was diagnosed with and died from ovarian cancer at 51.  Ms. Bolt maternal grandfather was diagnosed with brain cancer at 35.  He had multiple sisters with ovarian cancer.  Ms. Mcintire maternal grandmother died from a straoke in her early 65s.  Ms. Alegria father also had a history of several polyps--he passed away in his 49s.  He had no brothers or sisters. Ms. Kroeger paternal grandmother was diagnosed with breast cancer in her 80s and with uterine cancer in her 77s.  Ms. Marks paternal grandfather was a smoker and was diagnosed with renal cancer and passed away in his mid-49s.  Patient's maternal ancestors are of [place] ([place], [place], and [place] descent, and paternal ancestors are of [place] and [place] descent. There is no reported Ashkenazi Jewish ancestry. There is no known consanguinity.  GENETIC COUNSELING ASSESSMENT: MORAIMA BURD is a 59 y.o. female with a family history of a CHEK2 mutation in her full sister.   Mutations within the CHEK2 gene cause increased risks for breast and colon cancer, as well as potentially other cancer risks since  this gene interacts with ATM, BRCA1, and p53 genes.  We, therefore, discussed and recommended the following at today's visit.   DISCUSSION: We reviewed the characteristics, features and inheritance patterns hereditary cancer caused by mutations in the CHEK2 gene. We also discussed genetic testing, including the appropriate family members to test, the process of testing, insurance coverage and turn-around-time for results. We discussed the implications  of a negative or positive result. We recommended Ms. Kibby pursue single-site genetic testing for the known familial mutation, c.1263delT within the CHEK2 gene.  Ms. Bert agreed and expressed her desire to proceed with the test.  Based on Ms. Adger's family history of cancer and the known CHEK2 mutation, she meets medical criteria for genetic testing. Despite that she meets criteria, she may still have an out of pocket cost. We discussed that if her out of pocket cost for testing is over $100, the laboratory will call and confirm whether she wants to proceed with testing.  If the out of pocket cost of testing is less than $100 she will be billed by the genetic testing laboratory.   PLAN: After considering the risks, benefits, and limitations, Ms. Sigmund  provided informed consent to pursue genetic testing and the blood sample was sent to [facility] for analysis of the [employer] for the c.1263delT mutation in the CHEK2 gene. Results should be available within approximately 3 weeks' time, at which point they will be disclosed by telephone to Ms. Milhouse, as will any additional recommendations warranted by these results. Ms. Alaniz will receive a summary of her genetic counseling visit and a copy of her results once available. This information will also be available in Epic. We encouraged Ms. Macneill to remain in contact with cancer genetics annually so that we can continuously update the family history and inform her of any changes in  cancer genetics and testing that may be of benefit for her family. Ms. Pistole questions were answered to her satisfaction today. Our contact information was provided should additional questions or concerns arise.  I also discussed with Ms. Deschene that we would be happy to see her niece and nephew here for genetic counseling and testing.  Her nephew lives locally, and her niece visits the area often.  We could also put her niece in touch with a genetic counselor in her area, if she is interested.  Thank you for the referral and allowing [place] to share in the care of your patient.   Jeanine Luz, MS Genetic Counselor [email or web address] phone: [phone]  The patient was seen for a total of 60 minutes in face-to-face genetic counseling.  This patient was discussed with Drs. Magrinat, Lindi Adie and/or Burr Medico who agrees with the above.    _______________________________________________________________________ For Office Staff:  Number of people involved in session: 1 Was an Intern/ student involved with case: no

## 2015-04-16 ENCOUNTER — Telehealth: Payer: Self-pay | Admitting: Genetic Counselor

## 2015-04-19 ENCOUNTER — Ambulatory Visit: Payer: Self-pay | Admitting: Genetic Counselor

## 2015-04-19 ENCOUNTER — Telehealth: Payer: Self-pay | Admitting: Genetic Counselor

## 2015-04-19 DIAGNOSIS — Z1379 Encounter for other screening for genetic and chromosomal anomalies: Secondary | ICD-10-CM | POA: Insufficient documentation

## 2015-04-19 NOTE — Telephone Encounter (Signed)
Left Jasmine Nelson a message stating that her test results are back.  If she could please call my office.  I remember her saying she worked a late shift, so she could also leave me a message and let me know when would be a good time to call her back this week.

## 2015-04-19 NOTE — Progress Notes (Signed)
GENETIC TEST RESULTS  HPI: Ms. Jasmine Nelson was previously seen in the [place] clinic due to a family history of a pathogenic CHEK2 mutation found in Ms. Jasmine Nelson's sister and concerns regarding a presence of this known familial mutation in herself, and hereditary predisposition to cancer. Please refer to our prior cancer genetics clinic note from March 25, 2015 for more information regarding Ms. Jasmine Nelson's medical, social and family histories, and our assessment and recommendations, at the time. Ms. Jasmine Nelson recent genetic test results were disclosed to her, as were recommendations warranted by these results. These results and recommendations are discussed in more detail below.  GENETIC TEST RESULTS: At the time of Ms. Jasmine Nelson's visit on 03/25/15, we recommended she pursue genetic testing of the Specific Site Analysis of the CHEK2 gene for the known familial mutation, "c.1263delT" through [facility] [place]  Those results are now back, the report date for which is April 14, 2015. Genetic testing was normal, and did not reveal the "c.1263delT" mutation in these genes. The test report will be scanned into EPIC and will be located under the Media tab.   We call this result a true negative result because the cancer-causing mutation was identified in Ms. Jasmine Nelson's family, and she did not inherit it.  Given this negative result, Ms. Jasmine Nelson's chances of developing CHEK2-related cancers are the same as they are in the general population.     ADDITIONAL GENETIC TESTING: We discussed with Ms. Jasmine Nelson that there are other genes that are associated with increased cancer risk that can be analyzed. The laboratories that offer such testing look at these additional genes via a hereditary cancer gene panel. We discussed that mutations within the CHEK2 gene has not yet been associated with significantly increased cancer risks other than those for breast and colon cancers.  Thus, this particular  mutation in Ms. Jasmine Nelson's sister could very likely have been inherited from her father rather than her mother.  If this is the case, then we still do not have an explanation for the cancer on her mother's side of the family, particularly the ovarian cancer in her maternal aunt and in her great aunts.  Thus, based on the family history, she would meet criteria for genetic testing for a larger cancer panel including the BRCA1/2 genes as well as other genes associated with increased risks for ovarian cancer.  If Ms. Jasmine Nelson is interested in further genetic testing, then we would recommend her proceed with the 32-gene CancerNext Panel through [employer] [facility] [employer] [facility].  This panel includes genes related to increased risks for ovarian and other related cancers; it would also encompass genes associated with polyposis conditions since we know that Ms. Jasmine Nelson's mother had a history of many colonic polyps but we do not have an exact number.  Ms. Jasmine Nelson sister was initially tested with this 32-gene panel at which time the CHEK2 mutation was discovered.  FURTHER GENETIC TESTING/FOLLOW-UP: Ms. Jasmine Nelson expressed that she would like to proceed with the 32-gene CancerNext Panel testing through [facility].  The 32-gene CancerNext Panel includes sequencing and deletion/duplication analysis of the following 30 genes: APC, ATM, BARD1, BRCA1, BRCA2, BRIP1, BMPR1A, CDH1, CDK4, CDKN2A, CHEK2, MLH1, MRE11A, MSH2, MSH6, MUTYH, NBN, NF1, PALB2, PMS2, POLD1, POLE, PTEN, RAD50, RAD51C, RAD51D, SMAD4, SMARCA4, STK11, and TP53. This panel also includes deletion/duplication analysis (without sequencing) for two genes, EPCAM and SCG5/GREM1.  Ms. Jasmine Nelson will not need an additional blood draw since there is enough remaining specimen to perform this  further genetic testing.  Thus, we will facilitate this additional testing through contact with [facility].  Ms. Jasmine Nelson can expect results of this test in approximately 3 weeks, at  which time we will call her no matter the result.  We discussed that we may have either a positive, negative, or uncertain (VUS) result which we treat like a negative.  She is aware that we will further discuss cancer risks at the time of results report.  Our contact number was provided. Ms. Jasmine Nelson questions were answered to her satisfaction, and she knows she is welcome to call us at anytime with additional questions or concerns.   Jasmine Luz, MS Genetic Counselor [email or web address] Phone: [phone]

## 2015-04-19 NOTE — Telephone Encounter (Signed)
Discussed with Ms. Enyeart that her genetic test results for the CHEK2 site-specific analysis were negative for the mutation found in her sister.  Discussed additional testing options.  Since, CHEK2 mutations have not yet been demonstrated to have increased risks for ovarian cancer, it may be likely that this mutation in Ms. Klem's sister is inherited from their father's side of the family.  Thus, we still would not have an explanation for the maternal family history of ovarian and other cancers.  Ms. Kershaw would meet criteria for additional genetic testing, including testing of the BRCA1/2 genes, if interested.  Ms. Vierling would like to add this testing, so I will facilitate this.  We discussed that there is enough specimen to perform this test without an additional blood draw.  If she has an out-of-pocket cost greater than $100, [facility] will call her and let her know that cost and give her the option of cancelling this test.  We further discussed that there are three types of test results including positive, negative, and uncertain (VUS).  We discussed the implication of these three results.  I will call Ms. Dsouza no matter what the result; we should have the results of this test by 3 weeks time.  Ms. Spitzley will call me if she has any additional questions.

## 2015-05-07 ENCOUNTER — Telehealth: Payer: Self-pay | Admitting: Genetic Counselor

## 2015-05-07 NOTE — Telephone Encounter (Signed)
Left a message for Ms. Cayson that I have good news regarding test results.  Please call me back at [phone].

## 2015-05-10 ENCOUNTER — Ambulatory Visit: Payer: Self-pay | Admitting: Genetic Counselor

## 2015-05-10 ENCOUNTER — Other Ambulatory Visit (INDEPENDENT_AMBULATORY_CARE_PROVIDER_SITE_OTHER): Payer: 59

## 2015-05-10 DIAGNOSIS — Z0000 Encounter for general adult medical examination without abnormal findings: Secondary | ICD-10-CM | POA: Diagnosis not present

## 2015-05-10 DIAGNOSIS — Z1379 Encounter for other screening for genetic and chromosomal anomalies: Secondary | ICD-10-CM

## 2015-05-10 LAB — BASIC METABOLIC PANEL
BUN: 12 mg/dL (ref 6–23)
CALCIUM: 9.1 mg/dL (ref 8.4–10.5)
CO2: 27 mEq/L (ref 19–32)
Chloride: 101 mEq/L (ref 96–112)
Creatinine, Ser: 0.66 mg/dL (ref 0.40–1.20)
GFR: 97.11 mL/min (ref >60.00)
Glucose, Bld: 127 mg/dL — ABNORMAL HIGH (ref 70–99)
POTASSIUM: 4 meq/L (ref 3.5–5.1)
SODIUM: 134 meq/L — AB (ref 135–145)

## 2015-05-10 LAB — LIPID PANEL
CHOL/HDL RATIO: 4
Cholesterol: 197 mg/dL (ref 0–200)
HDL: 51.1 mg/dL (ref >39.00)
LDL CALC: 130 mg/dL — AB (ref 0–99)
NONHDL: 146
TRIGLYCERIDES: 78 mg/dL (ref 0.0–149.0)
VLDL: 15.6 mg/dL (ref 0.0–40.0)

## 2015-05-10 LAB — CBC WITH DIFFERENTIAL/PLATELET
BASOS ABS: 0.1 10*3/uL (ref 0.0–0.1)
Basophils Relative: 0.8 % (ref 0.0–3.0)
EOS PCT: 3.8 % (ref 0.0–5.0)
Eosinophils Absolute: 0.3 10*3/uL (ref 0.0–0.7)
HCT: 38.2 % (ref 36.0–46.0)
HEMOGLOBIN: 12.9 g/dL (ref 12.0–15.0)
LYMPHS ABS: 3.1 10*3/uL (ref 0.7–4.0)
Lymphocytes Relative: 35.9 % (ref 12.0–46.0)
MCHC: 33.8 g/dL (ref 30.0–36.0)
MCV: 91.3 fl (ref 78.0–100.0)
MONO ABS: 0.6 10*3/uL (ref 0.1–1.0)
MONOS PCT: 7.3 % (ref 3.0–12.0)
NEUTROS PCT: 52.2 % (ref 43.0–77.0)
Neutro Abs: 4.6 10*3/uL (ref 1.4–7.7)
Platelets: 332 10*3/uL (ref 150.0–400.0)
RBC: 4.18 Mil/uL (ref 3.87–5.11)
RDW: 13.5 % (ref 11.5–15.5)
WBC: 8.8 10*3/uL (ref 4.0–10.5)

## 2015-05-10 LAB — HEPATIC FUNCTION PANEL
ALK PHOS: 94 U/L (ref 39–117)
ALT: 14 U/L (ref 0–35)
AST: 21 U/L (ref 0–37)
Albumin: 4 g/dL (ref 3.5–5.2)
BILIRUBIN TOTAL: 0.5 mg/dL (ref 0.2–1.2)
Bilirubin, Direct: 0.1 mg/dL (ref 0.0–0.3)
Total Protein: 6.7 g/dL (ref 6.0–8.3)

## 2015-05-10 LAB — TSH: TSH: 4.03 u[IU]/mL (ref 0.35–4.50)

## 2015-05-10 LAB — VITAMIN D 25 HYDROXY (VIT D DEFICIENCY, FRACTURES): VITD: 12.42 ng/mL — ABNORMAL LOW (ref 30.00–100.00)

## 2015-05-10 NOTE — Telephone Encounter (Signed)
Discussed with Jasmine Nelson that her genetic test results were negative for mutations within any of 32 genes associated with increased risks for ovarian, colon, and other cancers.  One variant of uncertain significance (VUS) was found in the BARD1 gene.  At this point in time, this just means that the lab has not seen this particular change often enough to determine whether or not it is positive for causing an increased risk for cancer; as the lab tests more people and see this change more often, they will feel more comfortable calling it one way or the other.  In the mean time, we just treat this as a negative result.  When the lab updates this result, they will notify us and we will notify Jasmine Nelson.  She is also welcome to call us in the future to find out if this result has been updated.  Thus, any future cancer screening would just be based on the family history of cancer.  Because Jasmine Nelson has not had cancer herself, this result is less informative for her relatives.  Her maternal first cousins would likely be eligible for genetic testing based on the family history of ovarian cancer.  Testing for these relatives might help to better inform Jasmine Nelson's own cancer risks.  We also reviewed that Jasmine Nelson's niece and nephew have a 50% risk for inheriting their mother's CHEK2 mutation and thus should have genetic counseling and testing when they're ready.  These relatives can use the [email or web address] website to find a cancer genetic counselor in their area.  Jasmine Nelson and her family members are welcome to reach out to me with any further questions.

## 2015-05-10 NOTE — Progress Notes (Signed)
GENETIC TEST RESULTS  HPI: Jasmine Nelson was previously seen in the [place] clinic due to a family history of a CHEK2 mutation found in her sister, as well as a family history of colon, ovarian, and other cancers, and concerns regarding a hereditary predisposition to cancer. Ms. Oyervides previously had negative site-specific analysis testing for the known CHEK2 mutation called, "c.1263delT".  Please refer to our prior cancer genetics clinic notes from March 25, 2015 and April 19, 2015 for more information regarding Ms. Chittum's medical, social and family histories, and our assessment and recommendations, at the time. Ms. Harnden recent genetic test results were disclosed to her, as were recommendations warranted by these results. These results and recommendations are discussed in more detail below.  GENETIC TEST RESULTS: At the time of Ms. Salonga's visit on 03/25/15, we recommended she pursue genetic testing for specific site analysis of the known familial mutation, "c.1263delT" in the CHEK2 gene.  At that time, she tested negative for this mutation and we re-evaluated the family history and offered her additional, more comprehensive genetic testing based on the family history of ovarian and other cancers which are not at this time related to CHEK2 mutations.  Ms. Gurganus was interested in further testing, thus, we recommended Ms. Hawker undergo genetic testing for the 32-gene [employer] through [employer] [facility] ([place], [place]).  The CancerNext Panel offered by [employer] includes sequencing and deletion/duplication analysis for the following 30 genes: APC, ATM, BARD1, BRCA1, BRCA2, BRIP1, BMPR1A, CDH1, CDK4, CDKN2A, CHEK2,  MLH1, MRE11A, MSH2, MSH6, MUTYH, NBN, NF1, PALB2, PMS2, POLD1, POLE, PTEN, RAD50, RAD51C, RAD51D, SMAD4, SMARCA4, STK11, and TP53.  This panel also includes deletion/duplication analysis (without sequencing) for two genes, EPCAM and GREM1/SCG5.  Those results are  now back, the report date for which is May 05, 2015.  Genetic testing was normal, and did not reveal a deleterious mutation in these genes.  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  The test report will be scanned into EPIC and will be located under the Results Review tab in the Surgical Pathology>Molecular Pathology section.   Genetic testing did identify a variant of uncertain significance (VUS) called "c.2116A>G" or "p.K706E" in the [place] gene. At this time, it is unknown if this VUS is associated with an increased risk for cancer or if this is a normal finding. Since this VUS result is uncertain, it cannot help guide screening recommendations, and family members should not be tested for this VUS to help define their own cancer risks.  Also, we all have variants within our genes that make [place] unique individuals--most of these variants are benign.  Thus, we treat this VUS as a negative result.  With time, we suspect the lab will reclassify this variant and when they do, we will try to re-contact Ms. Kamrowski to discuss the reclassification further.  We also encouraged Ms. Frazzini to contact us in a year or two to obtain an update on the status of this VUS.  We discussed with Ms. Velador that since the current genetic testing is not perfect, it is possible there may be a gene mutation in one of these genes that current testing cannot detect, but that chance is small. We also discussed, that it is possible that another gene that has not yet been discovered, or that we have not yet tested, is responsible for the cancer diagnoses in the family, and it is, therefore,  important to remain in touch with cancer genetics in the future so that we can continue to offer Ms. Lamica the most up to date genetic testing.    CANCER SCREENING RECOMMENDATIONS: This normal result is reassuring and indicates that Ms. Mellette does not likely have an increased risk of  cancer due to a mutation in one of these genes.  We, therefore, recommended  Ms. Jacot continue to follow the cancer screening guidelines provided by her primary healthcare providers.   RECOMMENDATIONS FOR FAMILY MEMBERS: Women in this family might be at some increased risk of developing cancer, over the general population risk, simply due to the family history of cancer. Determining what side of the family the CHEK2 mutation in Ms. Vensel's sister was inherited from will be more helpful for better understanding cancer risks for relatives.  It is most likely that this is coming from the paternal side of the family, but, in that event, we still do not have an explanation for the maternal family history of ovarian and other cancers.  If the CHEK2 mutation is coming from the maternal side, the maternal first cousins may require additional colonoscopy screening based on their own mutation status.  Women with/at-risk for CHEK2 mutations in this family should have annual mammogram and breast MRI screening. Women in this family should also have a gynecological exam as recommended by their primary provider. All family members should have a colonoscopy by age 49 (or earlier as indicated by CHEK2 risk status).  Since this is an uncertain result, family members should not be tested for the VUS found in Ms. Berres to better determine their cancer risks.  We reviewed that Ms. Cataldi's niece and nephew should have genetic testing for the CHEK2 mutation found in their mother because each would be at a 50% risk for having inherited the same genetic change and would then be at an increased risk for breast and/or colon cancer, as well as potentially other cancers that may be identified in the future.  Based on Ms. Kneale's family history, we recommended her maternal first cousins have genetic counseling and potentially genetic testing to better identify the cause of the family history of ovarian cancer and other cancers on that  side and to potentially rule out the inheritance of the CHEK2 mutation from that side of the family. Ms. Doiron will let us know if we can be of any assistance in coordinating genetic counseling and/or testing for these family members.  We also discussed that these family members can use the '[email or web address]' website to search for a cancer genetic counselor in their area.   FOLLOW-UP: Lastly, we discussed with Ms. Bienvenue that cancer genetics is a rapidly advancing field and it is possible that new genetic tests will be appropriate for her and/or her family members in the future. We encouraged her to remain in contact with cancer genetics on an annual basis so we can update her personal and family histories and let her know of advances in cancer genetics that may benefit this family.   Our contact number was provided. Ms. Gonet questions were answered to her satisfaction, and she knows she is welcome to call us at anytime with additional questions or concerns.   Jeanine Luz, MS Genetic Counselor [email or web address] Phone: [phone]

## 2015-05-11 LAB — HIV ANTIBODY (ROUTINE TESTING W REFLEX): HIV 1&2 Ab, 4th Generation: NONREACTIVE

## 2015-05-14 ENCOUNTER — Ambulatory Visit (INDEPENDENT_AMBULATORY_CARE_PROVIDER_SITE_OTHER): Payer: 59 | Admitting: Internal Medicine

## 2015-05-14 ENCOUNTER — Encounter: Payer: Self-pay | Admitting: Internal Medicine

## 2015-05-14 VITALS — BP 122/82 | Temp 98.7°F | Wt 127.9 lb

## 2015-05-14 DIAGNOSIS — E559 Vitamin D deficiency, unspecified: Secondary | ICD-10-CM | POA: Diagnosis not present

## 2015-05-14 DIAGNOSIS — Z79899 Other long term (current) drug therapy: Secondary | ICD-10-CM

## 2015-05-14 DIAGNOSIS — R7301 Impaired fasting glucose: Secondary | ICD-10-CM

## 2015-05-14 DIAGNOSIS — I10 Essential (primary) hypertension: Secondary | ICD-10-CM

## 2015-05-14 MED ORDER — VITAMIN D (ERGOCALCIFEROL) 1.25 MG (50000 UNIT) PO CAPS: 50000.0000 [IU] | ORAL_CAPSULE | ORAL | Status: DC

## 2015-05-14 MED ORDER — LOSARTAN POTASSIUM-HCTZ 100-12.5 MG PO TABS: 1.0000 | ORAL_TABLET | Freq: Every day | ORAL | Status: DC

## 2015-05-14 NOTE — Patient Instructions (Signed)
Vit d deficiency  Take  rx supplement for  8-12 weeks .Marland Kitchen Then otc.  Add diuretic to bp med .    Cut out sugar beverages and   Decrease simple carbs .   Plan bmp and hga1c in 2 months before you cpx .

## 2015-05-14 NOTE — Progress Notes (Signed)
Pre visit review using our clinic review tool, if applicable. No additional management support is needed unless otherwise documented below in the visit note.  Chief Complaint  Patient presents with  . Follow-up    meds  . Hypertension     HPI: Patient comes in today for follow-up of blood pressure and medication evaluation. She states that her blood pressures up and down. Had some initial dizziness that went away with time. To go back on the Cymbalta can she was feeling better with that but doesn't have appointment with psychiatry for another 3-4 weeks. 129/79  bp up and down  Back on cuymbalyt    156/95  Then 133/80  Then 135/82   back on Cymbalta 90 mg   Doesn't take a vitamin D supplement. In the summer especially. Has had some low vitamin D in the past.  No personal history of diabetes does like sweets T drinks every day. ROS: See pertinent positives and negatives per HPI. No cough sob syncope  Past Medical History  Diagnosis Date  . Glaucoma   . Chicken pox   . Ingestion of substance     3 04  . Hamstring injury     torn  . Hx of thyroid disease     3rd grade  resolved   . Shift work sleep disorder     Family History  Problem Relation Age of Onset  . Cancer Mother 16    Died at 1 of Non-Hodgkins Lymphoma  . Lymphoma Mother     76  . Colon polyps Mother     "many"; required regular colonoscopies  . Alcohol abuse Father   . COPD Father   . Depression Father   . Hyperlipidemia Father   . Hypertension Father     deceased  . Colon polyps Father     "many"; required regular colonoscopies  . Colon cancer Sister     dx. 38-56  . Other Sister     +CHEK2 mutation  . COPD Maternal Aunt   . Ovarian cancer Maternal Aunt 62  . Stroke Maternal Grandmother   . Heart Problems Maternal Grandmother   . Cancer Maternal Grandfather 90    Brain Cancer  . Arthritis Paternal Grandmother   . Depression Paternal Grandmother   . Glaucoma Paternal Grandmother   . Breast  cancer Paternal Grandmother     dx. 84s  . Uterine cancer Paternal Grandmother     dx. 73s  . Renal cancer Paternal Grandfather     smoker  . Multiple myeloma Maternal Uncle     dx. well before his 22s  . Ovarian cancer Other     Social History   Social History  . Marital Status: Married    Spouse Name: N/A  . Number of Children: N/A  . Years of Education: N/A   Social History Main Topics  . Smoking status: Current Every Day Smoker -- 1.00 packs/day    Types: Cigarettes  . Smokeless tobacco: None  . Alcohol Use: No  . Drug Use: No  . Sexual Activity: Not Asked   Other Topics Concern  . None   Social History Narrative   Averages 5-8 hours of sleep per day.   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  P0G0          Outpatient Prescriptions Prior to Visit  Medication Sig Dispense Refill  . Armodafinil (NUVIGIL) 150 MG tablet Take 150 mg by mouth daily.    . busPIRone (BUSPAR) 30 MG tablet Take 30 mg by mouth daily.    . DULoxetine (CYMBALTA) 30 MG capsule Take 30 mg by mouth daily.    . DULoxetine (CYMBALTA) 60 MG capsule Take 60 mg by mouth daily.    Marland Kitchen gabapentin (NEURONTIN) 300 MG capsule Take 300 mg by mouth 2 (two) times daily.    . hydrOXYzine (ATARAX/VISTARIL) 10 MG tablet take 1 to 2 tablets by mouth twice a day if needed for anxiety  0  . losartan (COZAAR) 100 MG tablet Take 1 tablet (100 mg total) by mouth daily. 90 tablet 3  . zolpidem (AMBIEN) 10 MG tablet Take 10 mg by mouth at bedtime as needed for sleep.    . Brexpiprazole (REXULTI) 1 MG TABS Take 1 mg by mouth daily.    Marland Kitchen LORazepam (ATIVAN) 1 MG tablet Can take for acute anxiety .  As needed bid (Patient not taking: Reported on 05/14/2015) 20 tablet 0  . doxycycline (VIBRA-TABS) 100 MG tablet Take 1 tablet (100 mg total) by mouth 2 (two) times daily. 14 tablet  0   No facility-administered medications prior to visit.     EXAM:  BP 122/82 mmHg  Temp(Src) 98.7 F (37.1 C) (Oral)  Wt 127 lb 14.4 oz (58.015 kg)  Body mass index is 21.28 kg/(m^2).  GENERAL: vitals reviewed and listed above, alert, oriented, appears well hydrated and in no acute distress MS: moves all extremities without noticeable focal  abnormality PSYCH: pleasant and cooperative, no obvious depression or anxiety BP Readings from Last 3 Encounters:  05/14/15 122/82  03/05/15 136/96  01/22/15 158/86   Wt Readings from Last 3 Encounters:  05/14/15 127 lb 14.4 oz (58.015 kg)  03/05/15 129 lb 12.8 oz (58.877 kg)  01/22/15 131 lb (59.421 kg)   Lab Results  Component Value Date   WBC 8.8 05/10/2015   HGB 12.9 05/10/2015   HCT 38.2 05/10/2015   PLT 332.0 05/10/2015   GLUCOSE 127* 05/10/2015   CHOL 197 05/10/2015   TRIG 78.0 05/10/2015   HDL 51.10 05/10/2015   LDLCALC 130* 05/10/2015   ALT 14 05/10/2015   AST 21 05/10/2015   NA 134* 05/10/2015   K 4.0 05/10/2015   CL 101 05/10/2015   CREATININE 0.66 05/10/2015   BUN 12 05/10/2015   CO2 27 05/10/2015   TSH 4.03 05/10/2015   BP Readings from Last 3 Encounters:  05/14/15 122/82  03/05/15 136/96  01/22/15 158/86     ASSESSMENT AND PLAN:  Discussed the following assessment and plan:  Essential hypertension - Good reading today but not at goal out of office Cymbalta possibly adding add low-dose diuretic with caution following sodium level potassium  Fasting hyperglycemia - Apparently no history of same repeat chemistry in 2 months with A1c fast if she can decrease simple sugars review  Vitamin D deficiency - Level was 12 options discussed boost with 50,000 units a week for 8-12 weeks and then plan on over-the-counter supplement  Medication management  -Patient advised to return or notify health care team  if symptoms worsen ,persist or new concerns arise.  Patient Instructions  Vit d deficiency  Take  rx  supplement for  8-12 weeks .Marland Kitchen Then otc.  Add diuretic to bp med .    Cut  out sugar beverages and   Decrease simple carbs .   Plan bmp and hga1c in 2 months before you cpx .     Standley Brooking. Aradhya Shellenbarger M.D.

## 2015-05-27 ENCOUNTER — Other Ambulatory Visit (HOSPITAL_COMMUNITY): Payer: Self-pay | Admitting: Gynecology

## 2015-05-27 DIAGNOSIS — Z1231 Encounter for screening mammogram for malignant neoplasm of breast: Secondary | ICD-10-CM

## 2015-05-28 ENCOUNTER — Ambulatory Visit (HOSPITAL_COMMUNITY)
Admission: RE | Admit: 2015-05-28 | Discharge: 2015-05-28 | Disposition: A | Discharge status: Home / Self Care | Payer: 59 | Source: Ambulatory Visit | Attending: Gynecology | Admitting: Gynecology

## 2015-05-28 DIAGNOSIS — Z1231 Encounter for screening mammogram for malignant neoplasm of breast: Secondary | ICD-10-CM | POA: Insufficient documentation

## 2015-06-25 ENCOUNTER — Other Ambulatory Visit: Payer: Self-pay | Admitting: Internal Medicine

## 2015-06-25 DIAGNOSIS — R739 Hyperglycemia, unspecified: Secondary | ICD-10-CM

## 2015-06-28 ENCOUNTER — Other Ambulatory Visit: Payer: 59

## 2015-07-02 ENCOUNTER — Encounter: Payer: Self-pay | Admitting: Internal Medicine

## 2015-07-02 ENCOUNTER — Ambulatory Visit (INDEPENDENT_AMBULATORY_CARE_PROVIDER_SITE_OTHER): Payer: 59 | Admitting: Internal Medicine

## 2015-07-02 VITALS — BP 126/86 | Temp 98.5°F | Ht 64.75 in | Wt 126.3 lb

## 2015-07-02 DIAGNOSIS — I83812 Varicose veins of left lower extremities with pain: Secondary | ICD-10-CM

## 2015-07-02 DIAGNOSIS — I10 Essential (primary) hypertension: Secondary | ICD-10-CM

## 2015-07-02 DIAGNOSIS — F17200 Nicotine dependence, unspecified, uncomplicated: Secondary | ICD-10-CM | POA: Diagnosis not present

## 2015-07-02 DIAGNOSIS — Z1159 Encounter for screening for other viral diseases: Secondary | ICD-10-CM

## 2015-07-02 DIAGNOSIS — Z129 Encounter for screening for malignant neoplasm, site unspecified: Secondary | ICD-10-CM

## 2015-07-02 DIAGNOSIS — R7301 Impaired fasting glucose: Secondary | ICD-10-CM

## 2015-07-02 DIAGNOSIS — Z79899 Other long term (current) drug therapy: Secondary | ICD-10-CM

## 2015-07-02 DIAGNOSIS — Z0000 Encounter for general adult medical examination without abnormal findings: Secondary | ICD-10-CM | POA: Diagnosis not present

## 2015-07-02 LAB — BASIC METABOLIC PANEL
BUN: 9 mg/dL (ref 6–23)
CALCIUM: 9.5 mg/dL (ref 8.4–10.5)
CO2: 30 mEq/L (ref 19–32)
CREATININE: 0.7 mg/dL (ref 0.40–1.20)
Chloride: 97 mEq/L (ref 96–112)
GFR: 90.69 mL/min (ref >60.00)
Glucose, Bld: 80 mg/dL (ref 70–99)
Potassium: 4 mEq/L (ref 3.5–5.1)
Sodium: 134 mEq/L — ABNORMAL LOW (ref 135–145)

## 2015-07-02 LAB — HEMOGLOBIN A1C: HEMOGLOBIN A1C: 5.5 % (ref 4.6–6.5)

## 2015-07-02 NOTE — Progress Notes (Signed)
Pre visit review using our clinic review tool, if applicable. No additional management support is needed unless otherwise documented below in the visit note.  Chief Complaint  Patient presents with  . Annual Exam    HPI: Patient  Jasmine Nelson  59 y.o. comes in today for Preventive Health Care visit  And  Chronic disease management.  BP seems ok and no se of med as stataed   Still smoking asks about lung cancer screening  And to try to do the tob quit  Line education via pharmacy  Psych meds changed to dr [facility] for coverage reasongs but she is not longer taking [place] insurance either.pying out of pocket   Sore area med left leg to touch for a while no redness or progression  Has vv   Sees eye doc  caglauscoma  Stable sees dr Herbert Deaner   Health Maintenance  Topic Date Due  . Hepatitis C Screening  1955-10-09  . TETANUS/TDAP  05/24/1974  . ZOSTAVAX  05/25/2015  . INFLUENZA VACCINE  04/25/2016  . MAMMOGRAM  05/27/2017  . COLONOSCOPY  12/11/2017  . PAP SMEAR  12/24/2017  . HIV Screening  Completed   Health Maintenance Review LIFESTYLE:  Exercise:   Active in day  Tobacco/ETS:still smoking  Alcohol:    no Sugar beverages:  Sweet tea cut back .  Sleep:  Day   ROS: see above GEN/ HEENT: No fever, significant weight changes sweats headaches vision problems hearing changes, CV/ PULM; No chest pain shortness of breath cough, syncope,edema  change in exercise tolerance. GI /GU: No adominal pain, vomiting, change in bowel habits. No blood in the stool. No significant GU symptoms. SKIN/HEME: ,no acute skin rashes suspicious lesions or bleeding. No lymphadenopathy, nodules, masses.  NEURO/ PSYCH:  No neurologic signs such as weakness numbness. No depression anxiety. IMM/ Allergy: No unusual infections.  Allergy .   REST of 12 system review negative except as per HPI   Past Medical History  Diagnosis Date  . Glaucoma   . Chicken pox   . Ingestion of substance     3 04  .  Hamstring injury     torn  . Hx of thyroid disease     3rd grade  resolved   . Shift work sleep disorder     Past Surgical History  Procedure Laterality Date  . Adenoidectomy  1959    Family History  Problem Relation Age of Onset  . Cancer Mother 81    Died at 12 of Non-Hodgkins Lymphoma  . Lymphoma Mother     88  . Colon polyps Mother     "many"; required regular colonoscopies  . Alcohol abuse Father   . COPD Father   . Depression Father   . Hyperlipidemia Father   . Hypertension Father     deceased  . Colon polyps Father     "many"; required regular colonoscopies  . Colon cancer Sister     dx. 76-56  . Other Sister     +CHEK2 mutation  . COPD Maternal Aunt   . Ovarian cancer Maternal Aunt 62  . Stroke Maternal Grandmother   . Heart Problems Maternal Grandmother   . Cancer Maternal Grandfather 21    Brain Cancer  . Arthritis Paternal Grandmother   . Depression Paternal Grandmother   . Glaucoma Paternal Grandmother   . Breast cancer Paternal Grandmother     dx. 56s  . Uterine cancer Paternal Grandmother     dx. 66s  .  Renal cancer Paternal Grandfather     smoker  . Multiple myeloma Maternal Uncle     dx. well before his 52s  . Ovarian cancer Other     Social History   Social History  . Marital Status: Married    Spouse Name: N/A  . Number of Children: N/A  . Years of Education: N/A   Social History Main Topics  . Smoking status: Current Every Day Smoker -- 1.00 packs/day    Types: Cigarettes  . Smokeless tobacco: None  . Alcohol Use: No  . Drug Use: No  . Sexual Activity: Not Asked   Other Topics Concern  . None   Social History Narrative   Averages 5-8 hours of sleep per day.   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   [id number]            Medication List       This list is accurate as of:  07/02/15 11:59 PM.  Always use your most recent med list.               busPIRone 30 MG tablet  Commonly known as:  BUSPAR  Take 30 mg by mouth daily.     chlordiazePOXIDE 5 MG capsule  Commonly known as:  LIBRIUM  Take 5 mg by mouth 2 (two) times daily.     DULoxetine 30 MG capsule  Commonly known as:  CYMBALTA  Take 30 mg by mouth daily.     gabapentin 300 MG capsule  Commonly known as:  NEURONTIN  Take 300 mg by mouth 2 (two) times daily.     LORazepam 1 MG tablet  Commonly known as:  ATIVAN  Can take for acute anxiety .  As needed bid     losartan-hydrochlorothiazide 100-12.5 MG tablet  Commonly known as:  HYZAAR  Take 1 tablet by mouth daily.     NUVIGIL 150 MG tablet  Generic drug:  Armodafinil  Take 150 mg by mouth daily.     TRINTELLIX 10 MG Tabs  Generic drug:  Vortioxetine HBr  Take 1 tablet by mouth daily.     Vitamin D (Ergocalciferol) 50000 UNITS Caps capsule  Commonly known as:  DRISDOL  Take 1 capsule (50,000 Units total) by mouth every 7 (seven) days. For 12 weeks  Or as directed     zolpidem 10 MG tablet  Commonly known as:  AMBIEN  Take 10 mg by mouth at bedtime as needed for sleep.          EXAM:  BP 126/86 mmHg  Temp(Src) 98.5 F (36.9 C) (Oral)  Ht 5' 4.75" (1.645 m)  Wt 126 lb 4.8 oz (57.289 kg)  BMI 21.17 kg/m2  Body mass index is 21.17 kg/(m^2).  Physical Exam: Vital signs reviewed GMW:NUUV is a well-developed well-nourished alert cooperative    who appearsr stated age in no acute distress.  HEENT: normocephalic atraumatic , Eyes: PERRL EOM's full, conjunctiva clear, Nares: paten,t no deformity discharge or tenderness., Ears: no deformity EAC's clear TMs with normal landmarks. Mouth: clear OP, no lesions, edema.  Moist mucous membranes. Dentition in adequate repair. NECK: supple without masses, thyromegaly or bruits. CHEST/PULM:  Clear to auscultation and percussion breath sounds equal no wheeze , rales or rhonchi.  No chest  wall deformities or tenderness.Breast: normal by inspection . No dimpling, discharge, masses, tenderness or discharge . CV: PMI is nondisplaced, S1 S2 no gallops, murmurs, rubs. Peripheral pulses are full without delay.No JVD .  ABDOMEN: Bowel sounds normal nontender  No guard or rebound, no hepato splenomegal no CVA tenderness.  Extremtities:  No clubbing cyanosis or edema, no acute joint swelling or redness no focal atrophy  Vv right med leg ? Thickening sore but no warmth redness or mass  NEURO:  Oriented x3, cranial nerves 3-12 appear to be intact, no obvious focal weakness,gait within normal limits no abnormal reflexes or asymmetrical SKIN: No acute rashes normal turgor, color, no bruising or petechiae. PSYCH: Oriented, good eye contact, no obvious depression anxiety, cognition and judgment appear normal. LN: no cervical axillary inguinal adenopathy  Lab Results  Component Value Date   WBC 8.8 05/10/2015   HGB 12.9 05/10/2015   HCT 38.2 05/10/2015   PLT 332.0 05/10/2015   GLUCOSE 80 07/02/2015   CHOL 197 05/10/2015   TRIG 78.0 05/10/2015   HDL 51.10 05/10/2015   LDLCALC 130* 05/10/2015   ALT 14 05/10/2015   AST 21 05/10/2015   NA 134* 07/02/2015   K 4.0 07/02/2015   CL 97 07/02/2015   CREATININE 0.70 07/02/2015   BUN 9 07/02/2015   CO2 30 07/02/2015   TSH 4.03 05/10/2015   HGBA1C 5.5 07/02/2015   BP Readings from Last 3 Encounters:  07/02/15 126/86  05/14/15 122/82  03/05/15 136/96   Wt Readings from Last 3 Encounters:  07/02/15 126 lb 4.8 oz (57.289 kg)  05/14/15 127 lb 14.4 oz (58.015 kg)  03/05/15 129 lb 12.8 oz (58.877 kg)     ASSESSMENT AND PLAN:  Discussed the following assessment and plan:  Visit for preventive health examination  Fasting hyperglycemia - lsi check a1c  and follow  - Plan: Hemoglobin M3N, Basic metabolic panel  Essential hypertension - home monitor cuff slighly large but correlates  good enough with office cuffcont med - Plan: Basic  metabolic panel  Tobacco dependence - counsled agree with plan  - Plan: Ambulatory Referral for Lung Cancer Scre  Medication management  Need for hepatitis C screening test - Plan: Hepatitis C antibody  Cancer screening - disclung cancer screening studies risk bnenfit  refer for counseling etc  - Plan: Ambulatory Referral for Lung Cancer Scre Right  Leg pain . q follow  Disc zoster  vaccine  Will wait to see if newer vaccine soon available Patient Care Team: Burnis Medin, MD as PCP - General Monna Fam, MD as Consulting Physician (Ophthalmology) Delila Pereyra, MD as Consulting Physician (Gynecology) Chucky May, MD as Consulting Physician (Psychiatry) Patient Instructions  Continue attempts to stop tobacco and sugar drinks  Will notify you  of labs when available.  BP   machine is good enough . If the leg soreness progresses or redness  then fu for recheck.   Health Maintenance, Female Adopting a healthy lifestyle and getting preventive care can go a long way to promote health and wellness. Talk with your health care provider about what schedule of regular examinations is right for you. This is a good chance for you to check in with your provider about disease prevention and staying healthy. In between checkups, there are plenty of things you can do on your own. Experts have done a lot of research about which lifestyle changes and preventive measures are most likely to keep you healthy. Ask your health  care provider for more information. WEIGHT AND DIET  Eat a healthy diet  Be sure to include plenty of vegetables, fruits, low-fat dairy products, and lean protein.  Do not eat a lot of foods high in solid fats, added sugars, or salt.  Get regular exercise. This is one of the most important things you can do for your health.  Most adults should exercise for at least 150 minutes each week. The exercise should increase your heart rate and make you sweat (moderate-intensity  exercise).  Most adults should also do strengthening exercises at least twice a week. This is in addition to the moderate-intensity exercise.  Maintain a healthy weight  Body mass index (BMI) is a measurement that can be used to identify possible weight problems. It estimates body fat based on height and weight. Your health care provider can help determine your BMI and help you achieve or maintain a healthy weight.  For females 60 years of age and older:   A BMI below 18.5 is considered underweight.  A BMI of 18.5 to 24.9 is normal.  A BMI of 25 to 29.9 is considered overweight.  A BMI of 30 and above is considered obese.  Watch levels of cholesterol and blood lipids  You should start having your blood tested for lipids and cholesterol at 60 years of age, then have this test every 5 years.  You may need to have your cholesterol levels checked more often if:  Your lipid or cholesterol levels are high.  You are older than 60 years of age.  You are at high risk for heart disease.  CANCER SCREENING   Lung Cancer  Lung cancer screening is recommended for adults 68-38 years old who are at high risk for lung cancer because of a history of smoking.  A yearly low-dose CT scan of the lungs is recommended for people who:  Currently smoke.  Have quit within the past 15 years.  Have at least a 30-pack-year history of smoking. A pack year is smoking an average of one pack of cigarettes a day for 1 year.  Yearly screening should continue until it has been 15 years since you quit.  Yearly screening should stop if you develop a health problem that would prevent you from having lung cancer treatment.  Breast Cancer  Practice breast self-awareness. This means understanding how your breasts normally appear and feel.  It also means doing regular breast self-exams. Let your health care provider know about any changes, no matter how small.  If you are in your 20s or 30s, you should  have a clinical breast exam (CBE) by a health care provider every 1-3 years as part of a regular health exam.  If you are 53 or older, have a CBE every year. Also consider having a breast X-ray (mammogram) every year.  If you have a family history of breast cancer, talk to your health care provider about genetic screening.  If you are at high risk for breast cancer, talk to your health care provider about having an MRI and a mammogram every year.  Breast cancer gene (BRCA) assessment is recommended for women who have family members with BRCA-related cancers. BRCA-related cancers include:  Breast.  Ovarian.  Tubal.  Peritoneal cancers.  Results of the assessment will determine the need for genetic counseling and BRCA1 and BRCA2 testing. Cervical Cancer Your health care provider may recommend that you be screened regularly for cancer of the pelvic organs (ovaries, uterus, and vagina). This screening  involves a pelvic examination, including checking for microscopic changes to the surface of your cervix (Pap test). You may be encouraged to have this screening done every 3 years, beginning at age 28.  For women ages 72-65, health care providers may recommend pelvic exams and Pap testing every 3 years, or they may recommend the Pap and pelvic exam, combined with testing for human papilloma virus (HPV), every 5 years. Some types of HPV increase your risk of cervical cancer. Testing for HPV may also be done on women of any age with unclear Pap test results.  Other health care providers may not recommend any screening for nonpregnant women who are considered low risk for pelvic cancer and who do not have symptoms. Ask your health care provider if a screening pelvic exam is right for you.  If you have had past treatment for cervical cancer or a condition that could lead to cancer, you need Pap tests and screening for cancer for at least 20 years after your treatment. If Pap tests have been  discontinued, your risk factors (such as having a new sexual partner) need to be reassessed to determine if screening should resume. Some women have medical problems that increase the chance of getting cervical cancer. In these cases, your health care provider may recommend more frequent screening and Pap tests. Colorectal Cancer  This type of cancer can be detected and often prevented.  Routine colorectal cancer screening usually begins at 60 years of age and continues through 60 years of age.  Your health care provider may recommend screening at an earlier age if you have risk factors for colon cancer.  Your health care provider may also recommend using home test kits to check for hidden blood in the stool.  A small camera at the end of a tube can be used to examine your colon directly (sigmoidoscopy or colonoscopy). This is done to check for the earliest forms of colorectal cancer.  Routine screening usually begins at age 35.  Direct examination of the colon should be repeated every 5-10 years through 60 years of age. However, you may need to be screened more often if early forms of precancerous polyps or small growths are found. Skin Cancer  Check your skin from head to toe regularly.  Tell your health care provider about any new moles or changes in moles, especially if there is a change in a mole's shape or color.  Also tell your health care provider if you have a mole that is larger than the size of a pencil eraser.  Always use sunscreen. Apply sunscreen liberally and repeatedly throughout the day.  Protect yourself by wearing long sleeves, pants, a wide-brimmed hat, and sunglasses whenever you are outside. HEART DISEASE, DIABETES, AND HIGH BLOOD PRESSURE   High blood pressure causes heart disease and increases the risk of stroke. High blood pressure is more likely to develop in:  People who have blood pressure in the high end of the normal range (130-139/85-89 mm Hg).  People  who are overweight or obese.  People who are African American.  If you are 51-45 years of age, have your blood pressure checked every 3-5 years. If you are 16 years of age or older, have your blood pressure checked every year. You should have your blood pressure measured twice--once when you are at a hospital or clinic, and once when you are not at a hospital or clinic. Record the average of the two measurements. To check your blood pressure when you  are not at a hospital or clinic, you can use:  An automated blood pressure machine at a pharmacy.  A home blood pressure monitor.  If you are between 49 years and 77 years old, ask your health care provider if you should take aspirin to prevent strokes.  Have regular diabetes screenings. This involves taking a blood sample to check your fasting blood sugar level.  If you are at a normal weight and have a low risk for diabetes, have this test once every three years after 60 years of age.  If you are overweight and have a high risk for diabetes, consider being tested at a younger age or more often. PREVENTING INFECTION  Hepatitis B  If you have a higher risk for hepatitis B, you should be screened for this virus. You are considered at high risk for hepatitis B if:  You were born in a country where hepatitis B is common. Ask your health care provider which countries are considered high risk.  Your parents were born in a high-risk country, and you have not been immunized against hepatitis B (hepatitis B vaccine).  You have HIV or AIDS.  You use needles to inject street drugs.  You live with someone who has hepatitis B.  You have had sex with someone who has hepatitis B.  You get hemodialysis treatment.  You take certain medicines for conditions, including cancer, organ transplantation, and autoimmune conditions. Hepatitis C  Blood testing is recommended for:  Everyone born from [birth year] through 1966.  Anyone with known risk factors for  hepatitis C. Sexually transmitted infections (STIs)  You should be screened for sexually transmitted infections (STIs) including gonorrhea and chlamydia if:  You are sexually active and are younger than 60 years of age.  You are older than 60 years of age and your health care provider tells you that you are at risk for this type of infection.  Your sexual activity has changed since you were last screened and you are at an increased risk for chlamydia or gonorrhea. Ask your health care provider if you are at risk.  If you do not have HIV, but are at risk, it may be recommended that you take a prescription medicine daily to prevent HIV infection. This is called pre-exposure prophylaxis (PrEP). You are considered at risk if:  You are sexually active and do not regularly use condoms or know the HIV status of your partner(s).  You take drugs by injection.  You are sexually active with a partner who has HIV. Talk with your health care provider about whether you are at high risk of being infected with HIV. If you choose to begin PrEP, you should first be tested for HIV. You should then be tested every 3 months for as long as you are taking PrEP.  PREGNANCY   If you are premenopausal and you may become pregnant, ask your health care provider about preconception counseling.  If you may become pregnant, take 400 to 800 micrograms (mcg) of folic acid every day.  If you want to prevent pregnancy, talk to your health care provider about birth control (contraception). OSTEOPOROSIS AND MENOPAUSE   Osteoporosis is a disease in which the bones lose minerals and strength with aging. This can result in serious bone fractures. Your risk for osteoporosis can be identified using a bone density scan.  If you are 73 years of age or older, or if you are at risk for osteoporosis and fractures, ask your health care provider if  you should be screened.  Ask your health care provider whether you should take a  calcium or vitamin D supplement to lower your risk for osteoporosis.  Menopause may have certain physical symptoms and risks.  Hormone replacement therapy may reduce some of these symptoms and risks. Talk to your health care provider about whether hormone replacement therapy is right for you.  HOME CARE INSTRUCTIONS   Schedule regular health, dental, and eye exams.  Stay current with your immunizations.   Do not use any tobacco products including cigarettes, chewing tobacco, or electronic cigarettes.  If you are pregnant, do not drink alcohol.  If you are breastfeeding, limit how much and how often you drink alcohol.  Limit alcohol intake to no more than 1 drink per day for nonpregnant women. One drink equals 12 ounces of beer, 5 ounces of wine, or 1 ounces of hard liquor.  Do not use street drugs.  Do not share needles.  Ask your health care provider for help if you need support or information about quitting drugs.  Tell your health care provider if you often feel depressed.  Tell your health care provider if you have ever been abused or do not feel safe at home.   This information is not intended to replace advice given to you by your health care provider. Make sure you discuss any questions you have with your health care provider.   Document Released: 03/27/2011 Document Revised: 10/02/2014 Document Reviewed: 08/13/2013 Elsevier Interactive Patient Education 2016 [employer]. Rahima Fleishman M.D.

## 2015-07-02 NOTE — Patient Instructions (Addendum)
Continue attempts to stop tobacco and sugar drinks  Will notify you  of labs when available.  BP   machine is good enough . If the leg soreness progresses or redness  then fu for recheck.   Health Maintenance, Female Adopting a healthy lifestyle and getting preventive care can go a long way to promote health and wellness. Talk with your health care provider about what schedule of regular examinations is right for you. This is a good chance for you to check in with your provider about disease prevention and staying healthy. In between checkups, there are plenty of things you can do on your own. Experts have done a lot of research about which lifestyle changes and preventive measures are most likely to keep you healthy. Ask your health care provider for more information. WEIGHT AND DIET  Eat a healthy diet  Be sure to include plenty of vegetables, fruits, low-fat dairy products, and lean protein.  Do not eat a lot of foods high in solid fats, added sugars, or salt.  Get regular exercise. This is one of the most important things you can do for your health.  Most adults should exercise for at least 150 minutes each week. The exercise should increase your heart rate and make you sweat (moderate-intensity exercise).  Most adults should also do strengthening exercises at least twice a week. This is in addition to the moderate-intensity exercise.  Maintain a healthy weight  Body mass index (BMI) is a measurement that can be used to identify possible weight problems. It estimates body fat based on height and weight. Your health care provider can help determine your BMI and help you achieve or maintain a healthy weight.  For females 62 years of age and older:   A BMI below 18.5 is considered underweight.  A BMI of 18.5 to 24.9 is normal.  A BMI of 25 to 29.9 is considered overweight.  A BMI of 30 and above is considered obese.  Watch levels of cholesterol and blood lipids  You should  start having your blood tested for lipids and cholesterol at 60 years of age, then have this test every 5 years.  You may need to have your cholesterol levels checked more often if:  Your lipid or cholesterol levels are high.  You are older than 60 years of age.  You are at high risk for heart disease.  CANCER SCREENING   Lung Cancer  Lung cancer screening is recommended for adults 51-3 years old who are at high risk for lung cancer because of a history of smoking.  A yearly low-dose CT scan of the lungs is recommended for people who:  Currently smoke.  Have quit within the past 15 years.  Have at least a 30-pack-year history of smoking. A pack year is smoking an average of one pack of cigarettes a day for 1 year.  Yearly screening should continue until it has been 15 years since you quit.  Yearly screening should stop if you develop a health problem that would prevent you from having lung cancer treatment.  Breast Cancer  Practice breast self-awareness. This means understanding how your breasts normally appear and feel.  It also means doing regular breast self-exams. Let your health care provider know about any changes, no matter how small.  If you are in your 20s or 30s, you should have a clinical breast exam (CBE) by a health care provider every 1-3 years as part of a regular health exam.  If you  are 35 or older, have a CBE every year. Also consider having a breast X-ray (mammogram) every year.  If you have a family history of breast cancer, talk to your health care provider about genetic screening.  If you are at high risk for breast cancer, talk to your health care provider about having an MRI and a mammogram every year.  Breast cancer gene (BRCA) assessment is recommended for women who have family members with BRCA-related cancers. BRCA-related cancers include:  Breast.  Ovarian.  Tubal.  Peritoneal cancers.  Results of the assessment will determine the  need for genetic counseling and BRCA1 and BRCA2 testing. Cervical Cancer Your health care provider may recommend that you be screened regularly for cancer of the pelvic organs (ovaries, uterus, and vagina). This screening involves a pelvic examination, including checking for microscopic changes to the surface of your cervix (Pap test). You may be encouraged to have this screening done every 3 years, beginning at age 65.  For women ages 65-65, health care providers may recommend pelvic exams and Pap testing every 3 years, or they may recommend the Pap and pelvic exam, combined with testing for human papilloma virus (HPV), every 5 years. Some types of HPV increase your risk of cervical cancer. Testing for HPV may also be done on women of any age with unclear Pap test results.  Other health care providers may not recommend any screening for nonpregnant women who are considered low risk for pelvic cancer and who do not have symptoms. Ask your health care provider if a screening pelvic exam is right for you.  If you have had past treatment for cervical cancer or a condition that could lead to cancer, you need Pap tests and screening for cancer for at least 20 years after your treatment. If Pap tests have been discontinued, your risk factors (such as having a new sexual partner) need to be reassessed to determine if screening should resume. Some women have medical problems that increase the chance of getting cervical cancer. In these cases, your health care provider may recommend more frequent screening and Pap tests. Colorectal Cancer  This type of cancer can be detected and often prevented.  Routine colorectal cancer screening usually begins at 61 years of age and continues through 60 years of age.  Your health care provider may recommend screening at an earlier age if you have risk factors for colon cancer.  Your health care provider may also recommend using home test kits to check for hidden blood in  the stool.  A small camera at the end of a tube can be used to examine your colon directly (sigmoidoscopy or colonoscopy). This is done to check for the earliest forms of colorectal cancer.  Routine screening usually begins at age 33.  Direct examination of the colon should be repeated every 5-10 years through 60 years of age. However, you may need to be screened more often if early forms of precancerous polyps or small growths are found. Skin Cancer  Check your skin from head to toe regularly.  Tell your health care provider about any new moles or changes in moles, especially if there is a change in a mole's shape or color.  Also tell your health care provider if you have a mole that is larger than the size of a pencil eraser.  Always use sunscreen. Apply sunscreen liberally and repeatedly throughout the day.  Protect yourself by wearing long sleeves, pants, a wide-brimmed hat, and sunglasses whenever you are outside.  HEART DISEASE, DIABETES, AND HIGH BLOOD PRESSURE   High blood pressure causes heart disease and increases the risk of stroke. High blood pressure is more likely to develop in:  People who have blood pressure in the high end of the normal range (130-139/85-89 mm Hg).  People who are overweight or obese.  People who are African American.  If you are 18-39 years of age, have your blood pressure checked every 3-5 years. If you are 40 years of age or older, have your blood pressure checked every year. You should have your blood pressure measured twice--once when you are at a hospital or clinic, and once when you are not at a hospital or clinic. Record the average of the two measurements. To check your blood pressure when you are not at a hospital or clinic, you can use:  An automated blood pressure machine at a pharmacy.  A home blood pressure monitor.  If you are between 55 years and 79 years old, ask your health care provider if you should take aspirin to prevent  strokes.  Have regular diabetes screenings. This involves taking a blood sample to check your fasting blood sugar level.  If you are at a normal weight and have a low risk for diabetes, have this test once every three years after 60 years of age.  If you are overweight and have a high risk for diabetes, consider being tested at a younger age or more often. PREVENTING INFECTION  Hepatitis B  If you have a higher risk for hepatitis B, you should be screened for this virus. You are considered at high risk for hepatitis B if:  You were born in a country where hepatitis B is common. Ask your health care provider which countries are considered high risk.  Your parents were born in a high-risk country, and you have not been immunized against hepatitis B (hepatitis B vaccine).  You have HIV or AIDS.  You use needles to inject street drugs.  You live with someone who has hepatitis B.  You have had sex with someone who has hepatitis B.  You get hemodialysis treatment.  You take certain medicines for conditions, including cancer, organ transplantation, and autoimmune conditions. Hepatitis C  Blood testing is recommended for:  Everyone born from 1945 through 1965.  Anyone with known risk factors for hepatitis C. Sexually transmitted infections (STIs)  You should be screened for sexually transmitted infections (STIs) including gonorrhea and chlamydia if:  You are sexually active and are younger than 60 years of age.  You are older than 60 years of age and your health care provider tells you that you are at risk for this type of infection.  Your sexual activity has changed since you were last screened and you are at an increased risk for chlamydia or gonorrhea. Ask your health care provider if you are at risk.  If you do not have HIV, but are at risk, it may be recommended that you take a prescription medicine daily to prevent HIV infection. This is called pre-exposure prophylaxis  (PrEP). You are considered at risk if:  You are sexually active and do not regularly use condoms or know the HIV status of your partner(s).  You take drugs by injection.  You are sexually active with a partner who has HIV. Talk with your health care provider about whether you are at high risk of being infected with HIV. If you choose to begin PrEP, you should first be tested for HIV. You   should then be tested every 3 months for as long as you are taking PrEP.  PREGNANCY   If you are premenopausal and you may become pregnant, ask your health care provider about preconception counseling.  If you may become pregnant, take 400 to 800 micrograms (mcg) of folic acid every day.  If you want to prevent pregnancy, talk to your health care provider about birth control (contraception). OSTEOPOROSIS AND MENOPAUSE   Osteoporosis is a disease in which the bones lose minerals and strength with aging. This can result in serious bone fractures. Your risk for osteoporosis can be identified using a bone density scan.  If you are 65 years of age or older, or if you are at risk for osteoporosis and fractures, ask your health care provider if you should be screened.  Ask your health care provider whether you should take a calcium or vitamin D supplement to lower your risk for osteoporosis.  Menopause may have certain physical symptoms and risks.  Hormone replacement therapy may reduce some of these symptoms and risks. Talk to your health care provider about whether hormone replacement therapy is right for you.  HOME CARE INSTRUCTIONS   Schedule regular health, dental, and eye exams.  Stay current with your immunizations.   Do not use any tobacco products including cigarettes, chewing tobacco, or electronic cigarettes.  If you are pregnant, do not drink alcohol.  If you are breastfeeding, limit how much and how often you drink alcohol.  Limit alcohol intake to no more than 1 drink per day for  nonpregnant women. One drink equals 12 ounces of beer, 5 ounces of wine, or 1 ounces of hard liquor.  Do not use street drugs.  Do not share needles.  Ask your health care provider for help if you need support or information about quitting drugs.  Tell your health care provider if you often feel depressed.  Tell your health care provider if you have ever been abused or do not feel safe at home.   This information is not intended to replace advice given to you by your health care provider. Make sure you discuss any questions you have with your health care provider.   Document Released: 03/27/2011 Document Revised: 10/02/2014 Document Reviewed: 08/13/2013 Elsevier Interactive Patient Education 2016 Elsevier Inc.  

## 2015-07-03 ENCOUNTER — Encounter: Payer: Self-pay | Admitting: Internal Medicine

## 2015-07-03 LAB — HEPATITIS C ANTIBODY: HCV Ab: NEGATIVE

## 2015-07-06 ENCOUNTER — Telehealth: Payer: Self-pay | Admitting: Acute Care

## 2015-07-06 NOTE — Telephone Encounter (Signed)
I have called and left a message for Jasmine Nelson to call and schedule an appointment. I left my name and contact information. I will await a return call.

## 2015-07-07 ENCOUNTER — Telehealth: Payer: Self-pay | Admitting: Genetic Counselor

## 2015-07-07 NOTE — Telephone Encounter (Signed)
Discussed the EOB that Ms. Strojny received in the mail.  This is not uncommon for patient's to receive this from their insurance companies.  She may receive multiple EOBs.  I called [facility] customer service to make sure that this was the case.  The customer service agent stated that, yes, this was the policy, but could not give me a definitive answer regarding the patient's out-of-pocket.  Ms. Feher also had spoken with [facility] customer service and they gave her the same answer.  This seemed an odd answer, so I let Ms. Parlier know that I would reach out to our personal contact at [place] via email and see what else she could find out for [place].  I will let her know as soon as I learn more.

## 2015-07-09 ENCOUNTER — Telehealth: Payer: Self-pay | Admitting: Genetic Counselor

## 2015-07-15 NOTE — Telephone Encounter (Signed)
Left Message to make Appointment. Awaiting patients return call.

## 2015-07-23 NOTE — Telephone Encounter (Signed)
Left a message for Jasmine Nelson that, based on the lab's policy and the fact that they did not give her a phone call prior to running her test, that she should not have to pay more than $100 dollars out of pocket.  Should she have any more trouble from them or if she has any other questions.  She is welcome to give me a call.

## 2015-07-23 NOTE — Telephone Encounter (Signed)
Left Message to make Appointment.   3rd attempt.  Sent letter to pt.   Dear Jasmine Nelson, We have attempted to call you several times to schedule the lung screening Dr. Shanon Ace requested you have performed. We have been unable to contact you by phone. Please call the number below at your earliest convenience so that we can get you scheduled for your screening. We look forward to participating in your care.  Thank you,  [facility] [phone]

## 2015-10-04 MED FILL — TRINTELLIX 20 MG TABLET: 20 | 90 days supply | Qty: 90 | Fill #0

## 2015-11-03 DIAGNOSIS — L309 Dermatitis, unspecified: Secondary | ICD-10-CM | POA: Diagnosis not present

## 2015-11-03 DIAGNOSIS — Z86018 Personal history of other benign neoplasm: Secondary | ICD-10-CM | POA: Diagnosis not present

## 2015-11-03 DIAGNOSIS — D225 Melanocytic nevi of trunk: Secondary | ICD-10-CM | POA: Diagnosis not present

## 2015-11-16 MED FILL — LOSARTAN-HCTZ 100-12.5 MG T: 100-12.5 | 90 days supply | Qty: 90 | Fill #2

## 2015-11-17 MED FILL — TRIAMCINOLONE 0.1% OINTMENT: 0.1 | 14 days supply | Qty: 30 | Fill #0

## 2015-11-28 MED FILL — CHLORDIAZEPOXIDE 5 MG CAP: 5 | 90 days supply | Qty: 180 | Fill #1

## 2015-12-03 MED FILL — ARMODAFINIL 150 MG TABLET: 150 | 90 days supply | Qty: 90 | Fill #0

## 2015-12-08 MED FILL — ZOLPIDEM TARTRATE 10 MG TAB: 10 | 90 days supply | Qty: 90 | Fill #1

## 2016-01-03 MED FILL — GABAPENTIN 300 MG CAPSULE: 300 | 90 days supply | Qty: 270 | Fill #2

## 2016-01-03 MED FILL — TRINTELLIX 20 MG TABLET: 20 | 90 days supply | Qty: 90 | Fill #1

## 2016-02-15 MED FILL — LOSARTAN-HCTZ 100-12.5 MG T: 100-12.5 | 90 days supply | Qty: 90 | Fill #3

## 2016-02-24 MED FILL — CHLORDIAZEPOXIDE 5 MG CAP: 5 | 60 days supply | Qty: 180 | Fill #0

## 2016-03-03 MED FILL — ARMODAFINIL 150 MG TABLET: 150 | 90 days supply | Qty: 90 | Fill #0

## 2016-03-05 MED FILL — ZOLPIDEM TARTRATE 10 MG TAB: 10 | 90 days supply | Qty: 90 | Fill #0

## 2016-04-17 MED FILL — TRINTELLIX 20 MG TABLET: 20 | 90 days supply | Qty: 90 | Fill #2

## 2016-04-17 MED FILL — GABAPENTIN 300 MG CAPSULE: 300 | 90 days supply | Qty: 270 | Fill #3

## 2016-05-11 ENCOUNTER — Ambulatory Visit (INDEPENDENT_AMBULATORY_CARE_PROVIDER_SITE_OTHER): Payer: 59 | Admitting: Internal Medicine

## 2016-05-11 ENCOUNTER — Encounter: Payer: Self-pay | Admitting: Internal Medicine

## 2016-05-11 VITALS — BP 150/80 | Temp 99.6°F | Wt 115.4 lb

## 2016-05-11 DIAGNOSIS — R634 Abnormal weight loss: Secondary | ICD-10-CM

## 2016-05-11 DIAGNOSIS — B029 Zoster without complications: Secondary | ICD-10-CM | POA: Diagnosis not present

## 2016-05-11 MED ORDER — VALACYCLOVIR HCL 1 G PO TABS
1000.0000 mg | ORAL_TABLET | Freq: Three times a day (TID) | ORAL | 0 refills | Status: DC
Start: 2016-05-11 — End: 2016-10-19

## 2016-05-11 MED ORDER — HYDROCODONE-ACETAMINOPHEN 5-325 MG PO TABS: 1.0000 | ORAL_TABLET | Freq: Four times a day (QID) | ORAL | 0 refills | Status: DC | PRN

## 2016-05-11 NOTE — Progress Notes (Signed)
Pre visit review using our clinic review tool, if applicable. No additional management support is needed unless otherwise documented below in the visit note. 

## 2016-05-11 NOTE — Progress Notes (Signed)
Chief Complaint  Patient presents with  . Rash    Fatigue and body aches for 1 week.  Rash started yesterday.  . Fatigue  . Generalized Body Aches    HPI: Jasmine Nelson 60 y.o.   Onset acy for a week  No fever and had [facility] reg reason and nurse there  Discussed what is going on  Side pain but no rash and then last night had leg itching and   Hurt.  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   ROS: See pertinent positives and negatives per HPI. No cp sob new psych meds    etc  Past Medical History:  Diagnosis Date  . Chicken pox   . Glaucoma   . Hamstring injury    torn  . Hx of thyroid disease    3rd grade  resolved   . Ingestion of substance    3 04  . Shift work sleep disorder     Family History  Problem Relation Age of Onset  . Cancer Mother 64    Died at 28 of Non-Hodgkins Lymphoma  . Lymphoma Mother     15  . Colon polyps Mother     "many"; required regular colonoscopies  . Alcohol abuse Father   . COPD Father   . Depression Father   . Hyperlipidemia Father   . Hypertension Father     deceased  . Colon polyps Father     "many"; required regular colonoscopies  . Colon cancer Sister     dx. 42-56  . Other Sister     +CHEK2 mutation  . COPD Maternal Aunt   . Ovarian cancer Maternal Aunt 62  . Stroke Maternal Grandmother   . Heart Problems Maternal Grandmother   . Cancer Maternal Grandfather 87    Brain Cancer  . Arthritis Paternal Grandmother   . Depression Paternal Grandmother   . Glaucoma Paternal Grandmother   . Breast cancer Paternal Grandmother     dx. 24s  . Uterine cancer Paternal Grandmother     dx. 93s  . Renal cancer Paternal Grandfather     smoker  . Multiple myeloma Maternal Uncle     dx. well before his 80s  . Ovarian cancer Other     Social History   Social History  . Marital status: Married    Spouse name: N/A  . Number of children:  N/A  . Years of education: N/A   Social History Main Topics  . Smoking status: Current Every Day Smoker    Packs/day: 1.00    Types: Cigarettes  . Smokeless tobacco: None  . Alcohol use No  . Drug use: No  . Sexual activity: Not Asked   Other Topics Concern  . None   Social History Narrative   Averages 5-8 hours of sleep per day.   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   [id number]          Outpatient Medications Prior to Visit  Medication Sig Dispense Refill  . Armodafinil (NUVIGIL) 150 MG tablet Take 150 mg by mouth daily.    . chlordiazePOXIDE (LIBRIUM) 5 MG  capsule Take 5 mg by mouth 2 (two) times daily.   0  . gabapentin (NEURONTIN) 300 MG capsule Take 300 mg by mouth 2 (two) times daily.    Marland Kitchen losartan-hydrochlorothiazide (HYZAAR) 100-12.5 MG per tablet Take 1 tablet by mouth daily. 90 tablet 3  . Vortioxetine HBr (TRINTELLIX) 10 MG TABS Take 1 tablet by mouth daily.    Marland Kitchen zolpidem (AMBIEN) 10 MG tablet Take 10 mg by mouth at bedtime as needed for sleep.    Marland Kitchen LORazepam (ATIVAN) 1 MG tablet Can take for acute anxiety .  As needed bid (Patient not taking: Reported on 05/11/2016) 20 tablet 0  . busPIRone (BUSPAR) 30 MG tablet Take 30 mg by mouth daily.    . DULoxetine (CYMBALTA) 30 MG capsule Take 30 mg by mouth daily.    . Vitamin D, Ergocalciferol, (DRISDOL) 50000 UNITS CAPS capsule Take 1 capsule (50,000 Units total) by mouth every 7 (seven) days. For 12 weeks  Or as directed 12 capsule 0   No facility-administered medications prior to visit.      EXAM:  BP (!) 150/80 (BP Location: Right Arm, Patient Position: Sitting, Cuff Size: Normal)   Temp 99.6 F (37.6 C) (Oral)   Wt 115 lb 6.4 oz (52.3 kg)   BMI 19.35 kg/m   Body mass index is 19.35 kg/m.  GENERAL: vitals reviewed and listed above, alert, oriented, appears well  hydrated and in no acute distress HEENT: atraumatic, conjunctiva  clear, no obvious abnormalities on inspection of external nose and ears Skin right le  Tracking linear blotchy papules   dermatomal l 3 l4  Down to ankle  back no rash  MS: moves all extremities without noticeable focal  abnormality PSYCH: pleasant and cooperative, no obvious depression or anxiety  ASSESSMENT AND PLAN:  Discussed the following assessment and plan:  Shingles  Loss of weight - fu if ongoing for evaluation Early onset    Expectant management.   Valtrex short term pain  meds  Consider  Gaba lyrica if  persistent or progressive    Pt report noted weight loss  But not trying an not sick  Told to fu   In a months of weight loss continuing or other sx  For evaluation .  -Patient advised to return or notify health care team  if symptoms worsen ,persist or new concerns arise.  Patient Instructions  I AGREE this looks like shingles   We should begin valtrex  At this time.  Pain med as needed.    caution with  Pain med .  acitvity  As tolerated      Fu if pain continuing  And other meds as needed   Shingles Shingles, which is also known as herpes zoster, is an infection that causes a painful skin rash and fluid-filled blisters. Shingles is not related to genital herpes, which is a sexually transmitted infection.   Shingles only develops in people who:  Have had chickenpox.  Have received the chickenpox vaccine. (This is rare.) CAUSES Shingles is caused by varicella-zoster virus (VZV). This is the same virus that causes chickenpox. After exposure to VZV, the virus stays in the body in an inactive (dormant) state. Shingles develops if the virus reactivates. This can happen many years after the initial exposure to VZV. It is not known what causes this virus to reactivate. RISK FACTORS People who have had chickenpox or received the chickenpox vaccine are at risk for shingles. Infection is more common in people  who:  Are older than age 70.  Have a weakened defense (immune) system, such as those with HIV, AIDS, or cancer.  Are taking medicines that weaken the immune system, such as transplant medicines.  Are under great stress. SYMPTOMS Early symptoms of this condition include itching, tingling, and pain in an area on your skin. Pain may be described as burning, stabbing, or throbbing. A few days or weeks after symptoms start, a painful red rash appears, usually on one side of the body in a bandlike or beltlike pattern. The rash eventually turns into fluid-filled blisters that break open, scab over, and dry up in about 2-3 weeks. At any time during the infection, you may also develop:  A fever.  Chills.  A headache.  An upset stomach. DIAGNOSIS This condition is diagnosed with a skin exam. Sometimes, skin or fluid samples are taken from the blisters before a diagnosis is made. These samples are examined under a microscope or sent to a lab for testing. TREATMENT There is no specific cure for this condition. Your health care provider will probably prescribe medicines to help you manage pain, recover more quickly, and avoid long-term problems. Medicines may include:  Antiviral drugs.  Anti-inflammatory drugs.  Pain medicines. If the area involved is on your face, you may be referred to a specialist, such as an eye doctor (ophthalmologist) or an ear, nose, and throat (ENT) doctor to help you avoid eye problems, chronic pain, or disability. HOME CARE INSTRUCTIONS Medicines  Take medicines only as directed by your health care provider.  Apply an anti-itch or numbing cream to the affected area as directed by your health care provider. Blister and Rash Care  Take a cool bath or apply cool compresses to the area of the rash or blisters as directed by your health care provider. This may help with pain and itching.  Keep your rash covered with a loose bandage (dressing). Wear loose-fitting  clothing to help ease the pain of material rubbing against the rash.  Keep your rash and blisters clean with mild soap and cool water or as directed by your health care provider.  Check your rash every day for signs of infection. These include redness, swelling, and pain that lasts or increases.  Do not pick your blisters.  Do not scratch your rash. General Instructions  Rest as directed by your health care provider.  Keep all follow-up visits as directed by your health care provider. This is important.  Until your blisters scab over, your infection can cause chickenpox in people who have never had it or been vaccinated against it. To prevent this from happening, avoid contact with other people, especially:  Babies.  Pregnant women.  Children who have eczema.  Elderly people who have transplants.  People who have chronic illnesses, such as leukemia or AIDS. SEEK MEDICAL CARE IF:  Your pain is not relieved with prescribed medicines.  Your pain does not get better after the rash heals.  Your rash looks infected. Signs of infection include redness, swelling, and pain that lasts or increases. SEEK IMMEDIATE MEDICAL CARE IF:  The rash is on your face or nose.  You have facial pain, pain around your eye area, or loss of feeling on one side of your face.  You have ear pain or you have ringing in your ear.  You have loss of taste.  Your condition gets worse.   This information is not intended to replace advice given to you by your health care provider. Make sure  you discuss any questions you have with your health care provider.   Document Released: 09/11/2005 Document Revised: 10/02/2014 Document Reviewed: 07/23/2014 Elsevier Interactive Patient Education 2016 [place] K. Panosh M.D.

## 2016-05-11 NOTE — Patient Instructions (Addendum)
I AGREE this looks like shingles   We should begin valtrex  At this time.  Pain med as needed.    caution with  Pain med .  acitvity  As tolerated      Fu if pain continuing  And other meds as needed   Shingles Shingles, which is also known as herpes zoster, is an infection that causes a painful skin rash and fluid-filled blisters. Shingles is not related to genital herpes, which is a sexually transmitted infection.   Shingles only develops in people who:  Have had chickenpox.  Have received the chickenpox vaccine. (This is rare.) CAUSES Shingles is caused by varicella-zoster virus (VZV). This is the same virus that causes chickenpox. After exposure to VZV, the virus stays in the body in an inactive (dormant) state. Shingles develops if the virus reactivates. This can happen many years after the initial exposure to VZV. It is not known what causes this virus to reactivate. RISK FACTORS People who have had chickenpox or received the chickenpox vaccine are at risk for shingles. Infection is more common in people who:  Are older than age 19.  Have a weakened defense (immune) system, such as those with HIV, AIDS, or cancer.  Are taking medicines that weaken the immune system, such as transplant medicines.  Are under great stress. SYMPTOMS Early symptoms of this condition include itching, tingling, and pain in an area on your skin. Pain may be described as burning, stabbing, or throbbing. A few days or weeks after symptoms start, a painful red rash appears, usually on one side of the body in a bandlike or beltlike pattern. The rash eventually turns into fluid-filled blisters that break open, scab over, and dry up in about 2-3 weeks. At any time during the infection, you may also develop:  A fever.  Chills.  A headache.  An upset stomach. DIAGNOSIS This condition is diagnosed with a skin exam. Sometimes, skin or fluid samples are taken from the blisters before a diagnosis is made.  These samples are examined under a microscope or sent to a lab for testing. TREATMENT There is no specific cure for this condition. Your health care provider will probably prescribe medicines to help you manage pain, recover more quickly, and avoid long-term problems. Medicines may include:  Antiviral drugs.  Anti-inflammatory drugs.  Pain medicines. If the area involved is on your face, you may be referred to a specialist, such as an eye doctor (ophthalmologist) or an ear, nose, and throat (ENT) doctor to help you avoid eye problems, chronic pain, or disability. HOME CARE INSTRUCTIONS Medicines  Take medicines only as directed by your health care provider.  Apply an anti-itch or numbing cream to the affected area as directed by your health care provider. Blister and Rash Care  Take a cool bath or apply cool compresses to the area of the rash or blisters as directed by your health care provider. This may help with pain and itching.  Keep your rash covered with a loose bandage (dressing). Wear loose-fitting clothing to help ease the pain of material rubbing against the rash.  Keep your rash and blisters clean with mild soap and cool water or as directed by your health care provider.  Check your rash every day for signs of infection. These include redness, swelling, and pain that lasts or increases.  Do not pick your blisters.  Do not scratch your rash. General Instructions  Rest as directed by your health care provider.  Keep all follow-up  visits as directed by your health care provider. This is important.  Until your blisters scab over, your infection can cause chickenpox in people who have never had it or been vaccinated against it. To prevent this from happening, avoid contact with other people, especially:  Babies.  Pregnant women.  Children who have eczema.  Elderly people who have transplants.  People who have chronic illnesses, such as leukemia or AIDS. SEEK  MEDICAL CARE IF:  Your pain is not relieved with prescribed medicines.  Your pain does not get better after the rash heals.  Your rash looks infected. Signs of infection include redness, swelling, and pain that lasts or increases. SEEK IMMEDIATE MEDICAL CARE IF:  The rash is on your face or nose.  You have facial pain, pain around your eye area, or loss of feeling on one side of your face.  You have ear pain or you have ringing in your ear.  You have loss of taste.  Your condition gets worse.   This information is not intended to replace advice given to you by your health care provider. Make sure you discuss any questions you have with your health care provider.   Document Released: 09/11/2005 Document Revised: 10/02/2014 Document Reviewed: 07/23/2014 Elsevier Interactive Patient Education [employer].

## 2016-05-17 ENCOUNTER — Encounter (HOSPITAL_COMMUNITY): Payer: Self-pay

## 2016-05-19 ENCOUNTER — Other Ambulatory Visit: Payer: Self-pay | Admitting: Internal Medicine

## 2016-05-19 MED FILL — CHLORDIAZEPOXIDE 5 MG CAP: 5 | 90 days supply | Qty: 270 | Fill #1

## 2016-05-23 ENCOUNTER — Other Ambulatory Visit: Payer: Self-pay | Admitting: Family Medicine

## 2016-05-23 ENCOUNTER — Telehealth: Payer: Self-pay | Admitting: Family Medicine

## 2016-05-23 DIAGNOSIS — Z0000 Encounter for general adult medical examination without abnormal findings: Secondary | ICD-10-CM

## 2016-05-23 MED FILL — LOSARTAN-HCTZ 100-12.5 MG T: 100-12.5 | 90 days supply | Qty: 90 | Fill #0

## 2016-05-23 NOTE — Telephone Encounter (Signed)
Pt due for cpx and lab work in Oct 2017.  I have placed the lab orders.  Please help her to schedule both appointments.

## 2016-05-23 NOTE — Telephone Encounter (Signed)
90 day supply sent to the pharmacy by e-scribe.  Pt due for cpx and lab work in Oct.  Message sent to scheduling.  Lab orders entered.

## 2016-05-24 NOTE — Telephone Encounter (Signed)
lmom for pt to call back

## 2016-05-31 MED FILL — ARMODAFINIL 150 MG TABLET: 150 | 90 days supply | Qty: 90 | Fill #1

## 2016-05-31 MED FILL — ZOLPIDEM TARTRATE 10 MG TAB: 10 | 90 days supply | Qty: 90 | Fill #1

## 2016-06-02 NOTE — Telephone Encounter (Signed)
lmom for pt to call back

## 2016-06-02 NOTE — Telephone Encounter (Signed)
Pt called back and will call once she gets her work schedule.

## 2016-07-13 MED FILL — GABAPENTIN 300 MG CAPSULE: 300 | 90 days supply | Qty: 270 | Fill #0

## 2016-08-03 MED FILL — ZOLPIDEM TARTRATE 10 MG TAB: 10 | 15 days supply | Qty: 15 | Fill #0

## 2016-08-14 MED FILL — TRINTELLIX 20 MG TABLET: 20 | 90 days supply | Qty: 90 | Fill #3

## 2016-08-29 ENCOUNTER — Other Ambulatory Visit: Payer: Self-pay | Admitting: Internal Medicine

## 2016-08-30 MED FILL — ARMODAFINIL 150 MG TABLET: 150 | 30 days supply | Qty: 30 | Fill #0

## 2016-08-30 MED FILL — CHLORDIAZEPOXIDE 5 MG CAP: 5 | 30 days supply | Qty: 90 | Fill #0

## 2016-08-31 MED FILL — LOSARTAN-HCTZ 100-12.5 MG T: 100-12.5 | 30 days supply | Qty: 30 | Fill #0

## 2016-08-31 NOTE — Telephone Encounter (Signed)
Sent to the pharmacy for 30 days instead of 90.  Pt was notified on 06/02/16 that she needed an appt.  See message from 05/23/16.

## 2016-09-13 MED FILL — ZOLPIDEM TARTRATE 10 MG TAB: 10 | 90 days supply | Qty: 90 | Fill #0

## 2016-10-13 ENCOUNTER — Other Ambulatory Visit: Payer: Self-pay | Admitting: Internal Medicine

## 2016-10-13 MED FILL — ARMODAFINIL 150 MG TABLET: 150 | 90 days supply | Qty: 90 | Fill #0

## 2016-10-17 NOTE — Telephone Encounter (Signed)
Denied.  Pt not seen since 06/2015.  Pt has been notified that she needs an appointment.

## 2016-10-18 ENCOUNTER — Other Ambulatory Visit: Payer: Self-pay | Admitting: Internal Medicine

## 2016-10-18 NOTE — Progress Notes (Deleted)
No chief complaint on file.   HPI: Jasmine Nelson 61 y.o.   sda   fo r? bp  ROS: See pertinent positives and negatives per HPI.  Past Medical History:  Diagnosis Date  . Chicken pox   . Glaucoma   . Hamstring injury    torn  . Hx of thyroid disease    3rd grade  resolved   . Ingestion of substance    3 04  . Shift work sleep disorder     Family History  Problem Relation Age of Onset  . Cancer Mother 36    Died at 72 of Non-Hodgkins Lymphoma  . Lymphoma Mother     24  . Colon polyps Mother     "many"; required regular colonoscopies  . Alcohol abuse Father   . COPD Father   . Depression Father   . Hyperlipidemia Father   . Hypertension Father     deceased  . Colon polyps Father     "many"; required regular colonoscopies  . Colon cancer Sister     dx. 58-56  . Other Sister     +CHEK2 mutation  . COPD Maternal Aunt   . Ovarian cancer Maternal Aunt 62  . Stroke Maternal Grandmother   . Heart Problems Maternal Grandmother   . Cancer Maternal Grandfather 18    Brain Cancer  . Arthritis Paternal Grandmother   . Depression Paternal Grandmother   . Glaucoma Paternal Grandmother   . Breast cancer Paternal Grandmother     dx. 70s  . Uterine cancer Paternal Grandmother     dx. 74s  . Renal cancer Paternal Grandfather     smoker  . Multiple myeloma Maternal Uncle     dx. well before his 49s  . Ovarian cancer Other     Social History   Social History  . Marital status: Married    Spouse name: N/A  . Number of children: N/A  . Years of education: N/A   Social History Main Topics  . Smoking status: Current Every Day Smoker    Packs/day: 1.00    Types: Cigarettes  . Smokeless tobacco: Not on file  . Alcohol use No  . Drug use: No  . Sexual activity: Not on file   Other Topics Concern  . Not on file   Social History Narrative   Averages 5-8 hours of sleep per day.   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   P0G0          Outpatient Medications Prior to Visit  Medication Sig Dispense Refill  . Armodafinil (NUVIGIL) 150 MG tablet Take 150 mg by mouth daily.    . chlordiazePOXIDE (LIBRIUM) 5 MG capsule Take 5 mg by mouth 2 (two) times daily.   0  . Cholecalciferol (VITAMIN D PO) Take by mouth.    . gabapentin (NEURONTIN) 300 MG capsule Take 300 mg by mouth 2 (two) times daily.    Marland Kitchen HYDROcodone-acetaminophen (NORCO/VICODIN) 5-325 MG tablet Take 1 tablet by mouth every 6 (six) hours as needed for moderate pain. 10 tablet 0  . LORazepam (ATIVAN) 1 MG tablet Can take for acute anxiety .  As needed bid (Patient not taking: Reported on 05/11/2016) 20 tablet 0  . losartan-hydrochlorothiazide (HYZAAR)  100-12.5 MG tablet TAKE 1 TABLET BY MOUTH ONCE DAILY 30 tablet 0  . valACYclovir (VALTREX) 1000 MG tablet Take 1 tablet (1,000 mg total) by mouth 3 (three) times daily. 21 tablet 0  . Vortioxetine HBr (TRINTELLIX) 10 MG TABS Take 1 tablet by mouth daily.    Marland Kitchen zolpidem (AMBIEN) 10 MG tablet Take 10 mg by mouth at bedtime as needed for sleep.     No facility-administered medications prior to visit.      EXAM:  There were no vitals taken for this visit.  There is no height or weight on file to calculate BMI.  GENERAL: vitals reviewed and listed above, alert, oriented, appears well hydrated and in no acute distress HEENT: atraumatic, conjunctiva  clear, no obvious abnormalities on inspection of external nose and ears OP : no lesion edema or exudate  NECK: no obvious masses on inspection palpation  LUNGS: clear to auscultation bilaterally, no wheezes, rales or rhonchi, good air movement CV: HRRR, no clubbing cyanosis or  peripheral edema nl cap refill  MS: moves all extremities without noticeable focal  abnormality PSYCH: pleasant and cooperative, no obvious depression or anxiety Lab Results    Component Value Date   WBC 8.8 05/10/2015   HGB 12.9 05/10/2015   HCT 38.2 05/10/2015   PLT 332.0 05/10/2015   GLUCOSE 80 07/02/2015   CHOL 197 05/10/2015   TRIG 78.0 05/10/2015   HDL 51.10 05/10/2015   LDLCALC 130 (H) 05/10/2015   ALT 14 05/10/2015   AST 21 05/10/2015   NA 134 (L) 07/02/2015   K 4.0 07/02/2015   CL 97 07/02/2015   CREATININE 0.70 07/02/2015   BUN 9 07/02/2015   CO2 30 07/02/2015   TSH 4.03 05/10/2015   HGBA1C 5.5 07/02/2015   BP Readings from Last 3 Encounters:  05/11/16 (!) 150/80  07/02/15 126/86  05/14/15 122/82   Wt Readings from Last 3 Encounters:  05/11/16 115 lb 6.4 oz (52.3 kg)  07/02/15 126 lb 4.8 oz (57.3 kg)  05/14/15 127 lb 14.4 oz (58 kg)    ASSESSMENT AND PLAN:  Discussed the following assessment and plan:  No diagnosis found. Due for labs  -Patient advised to return or notify health care team  if symptoms worsen ,persist or new concerns arise.  There are no Patient Instructions on file for this visit.   Standley Brooking. Jhostin Epps M.D.

## 2016-10-19 ENCOUNTER — Ambulatory Visit (INDEPENDENT_AMBULATORY_CARE_PROVIDER_SITE_OTHER): Payer: 59 | Admitting: Internal Medicine

## 2016-10-19 ENCOUNTER — Encounter: Payer: Self-pay | Admitting: Internal Medicine

## 2016-10-19 ENCOUNTER — Ambulatory Visit: Payer: 59 | Admitting: Internal Medicine

## 2016-10-19 VITALS — BP 122/82 | Temp 97.8°F | Wt 111.0 lb

## 2016-10-19 DIAGNOSIS — I10 Essential (primary) hypertension: Secondary | ICD-10-CM

## 2016-10-19 DIAGNOSIS — F17200 Nicotine dependence, unspecified, uncomplicated: Secondary | ICD-10-CM | POA: Diagnosis not present

## 2016-10-19 DIAGNOSIS — Z79899 Other long term (current) drug therapy: Secondary | ICD-10-CM | POA: Diagnosis not present

## 2016-10-19 DIAGNOSIS — R634 Abnormal weight loss: Secondary | ICD-10-CM | POA: Diagnosis not present

## 2016-10-19 DIAGNOSIS — G4726 Circadian rhythm sleep disorder, shift work type: Secondary | ICD-10-CM | POA: Diagnosis not present

## 2016-10-19 DIAGNOSIS — E559 Vitamin D deficiency, unspecified: Secondary | ICD-10-CM | POA: Diagnosis not present

## 2016-10-19 LAB — TSH: TSH: 1.64 u[IU]/mL (ref 0.35–4.50)

## 2016-10-19 LAB — CBC WITH DIFFERENTIAL/PLATELET
BASOS ABS: 0 10*3/uL (ref 0.0–0.1)
Basophils Relative: 0.6 % (ref 0.0–3.0)
EOS ABS: 0.3 10*3/uL (ref 0.0–0.7)
Eosinophils Relative: 4.6 % (ref 0.0–5.0)
HEMATOCRIT: 36.9 % (ref 36.0–46.0)
HEMOGLOBIN: 12.6 g/dL (ref 12.0–15.0)
LYMPHS PCT: 36.7 % (ref 12.0–46.0)
Lymphs Abs: 2.7 10*3/uL (ref 0.7–4.0)
MCHC: 34 g/dL (ref 30.0–36.0)
MCV: 91.6 fl (ref 78.0–100.0)
Monocytes Absolute: 0.6 10*3/uL (ref 0.1–1.0)
Monocytes Relative: 7.6 % (ref 3.0–12.0)
Neutro Abs: 3.7 10*3/uL (ref 1.4–7.7)
Neutrophils Relative %: 50.5 % (ref 43.0–77.0)
Platelets: 319 10*3/uL (ref 150.0–400.0)
RBC: 4.03 Mil/uL (ref 3.87–5.11)
RDW: 13.5 % (ref 11.5–15.5)
WBC: 7.3 10*3/uL (ref 4.0–10.5)

## 2016-10-19 LAB — BASIC METABOLIC PANEL
BUN: 12 mg/dL (ref 6–23)
CALCIUM: 9.5 mg/dL (ref 8.4–10.5)
CHLORIDE: 101 meq/L (ref 96–112)
CO2: 33 mEq/L — ABNORMAL HIGH (ref 19–32)
CREATININE: 0.74 mg/dL (ref 0.40–1.20)
GFR: 84.69 mL/min (ref >60.00)
Glucose, Bld: 74 mg/dL (ref 70–99)
Potassium: 4.7 mEq/L (ref 3.5–5.1)
Sodium: 139 mEq/L (ref 135–145)

## 2016-10-19 LAB — HEPATIC FUNCTION PANEL
ALK PHOS: 99 U/L (ref 39–117)
ALT: 13 U/L (ref 0–35)
AST: 16 U/L (ref 0–37)
Albumin: 4.1 g/dL (ref 3.5–5.2)
BILIRUBIN DIRECT: 0 mg/dL (ref 0.0–0.3)
TOTAL PROTEIN: 6.5 g/dL (ref 6.0–8.3)
Total Bilirubin: 0.3 mg/dL (ref 0.2–1.2)

## 2016-10-19 LAB — VITAMIN D 25 HYDROXY (VIT D DEFICIENCY, FRACTURES): VITD: 13.94 ng/mL — ABNORMAL LOW (ref 30.00–100.00)

## 2016-10-19 LAB — T4, FREE: Free T4: 0.79 ng/dL (ref 0.60–1.60)

## 2016-10-19 MED ORDER — LOSARTAN POTASSIUM-HCTZ 100-12.5 MG PO TABS: 1.0000 | ORAL_TABLET | Freq: Every day | ORAL | 3 refills | Status: DC

## 2016-10-19 MED FILL — LOSARTAN-HCTZ 100-12.5 MG T: 100-12.5 | 90 days supply | Qty: 90 | Fill #0

## 2016-10-19 NOTE — Progress Notes (Signed)
Pre visit review using our clinic review tool, if applicable. No additional management support is needed unless otherwise documented below in the visit note. 

## 2016-10-19 NOTE — Patient Instructions (Addendum)
Will notify you  of labs when available.   Get  Your screening  .  As planned .  bp seems  Good .  Today . No change . Check in to the lung cancer screening  Again .  Plan fu  Depending on results

## 2016-10-19 NOTE — Progress Notes (Signed)
Chief Complaint  Patient presents with  . Follow-up  . Hypertension    HPI: Jasmine Nelson 61 y.o.    Over due for yearly visit for   Hypertension and med evaluation     Feel off if oof  With headaches and  Head pounding  On night work and gets off sometimes .  Switched to am  Before bed. Sometimes  Goes up at times But is taking medicine regularly it's possible she misses some because of her shift changes.  Rushes around.  ocass extra 1/2 medication.  She is thinking about retiring in trying to get a regular schedule. Medicine list reviewed. She does have fatigue uses Nuvigil on the day she works. A rather nights. She has had some weight loss in the recent past could be from stress but feels that she is eating normally. She is continuing to smoke again. Drinks a good amount of sweet tea negative alcohol RD.   ROS: See pertinent positives and negatives per HPI. Occasional chest discomfort occasional abdominal pain is over due for her colonoscopy GYN mammogram her previous docs have retired.  Past Medical History:  Diagnosis Date  . Chicken pox   . Glaucoma   . Hamstring injury    torn  . Hx of thyroid disease    3rd grade  resolved   . Ingestion of substance    3 04  . Shift work sleep disorder     Family History  Problem Relation Age of Onset  . Cancer Mother 27    Died at 28 of Non-Hodgkins Lymphoma  . Lymphoma Mother     57  . Colon polyps Mother     "many"; required regular colonoscopies  . Alcohol abuse Father   . COPD Father   . Depression Father   . Hyperlipidemia Father   . Hypertension Father     deceased  . Colon polyps Father     "many"; required regular colonoscopies  . Colon cancer Sister     dx. 61-56  . Other Sister     +CHEK2 mutation  . COPD Maternal Aunt   . Ovarian cancer Maternal Aunt 62  . Stroke Maternal Grandmother   . Heart Problems Maternal Grandmother   . Cancer Maternal Grandfather 72    Brain Cancer  . Arthritis Paternal  Grandmother   . Depression Paternal Grandmother   . Glaucoma Paternal Grandmother   . Breast cancer Paternal Grandmother     dx. 55s  . Uterine cancer Paternal Grandmother     dx. 61s  . Renal cancer Paternal Grandfather     smoker  . Multiple myeloma Maternal Uncle     dx. well before his 13s  . Ovarian cancer Other     Social History   Social History  . Marital status: Married    Spouse name: N/A  . Number of children: N/A  . Years of education: N/A   Social History Main Topics  . Smoking status: Current Every Day Smoker    Packs/day: 1.00    Types: Cigarettes  . Smokeless tobacco: Never Used  . Alcohol use No  . Drug use: No  . Sexual activity: Not Asked   Other Topics Concern  . None   Social History Narrative   Averages 5-8 hours of sleep per day.   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   P0G0          Outpatient Medications Prior to Visit  Medication Sig Dispense Refill  . Armodafinil (NUVIGIL) 150 MG tablet Take 150 mg by mouth daily.    . chlordiazePOXIDE (LIBRIUM) 5 MG capsule Take 5 mg by mouth 2 (two) times daily.   0  . gabapentin (NEURONTIN) 300 MG capsule Take 300 mg by mouth 2 (two) times daily.    . Vortioxetine HBr (TRINTELLIX) 10 MG TABS Take 1 tablet by mouth daily.    Marland Kitchen zolpidem (AMBIEN) 10 MG tablet Take 10 mg by mouth at bedtime as needed for sleep.    Marland Kitchen losartan-hydrochlorothiazide (HYZAAR) 100-12.5 MG tablet TAKE 1 TABLET BY MOUTH ONCE DAILY 30 tablet 0  . Cholecalciferol (VITAMIN D PO) Take by mouth.    Marland Kitchen HYDROcodone-acetaminophen (NORCO/VICODIN) 5-325 MG tablet Take 1 tablet by mouth every 6 (six) hours as needed for moderate pain. 10 tablet 0  . LORazepam (ATIVAN) 1 MG tablet Can take for acute anxiety .  As needed bid (Patient not taking: Reported on 05/11/2016) 20 tablet 0  . valACYclovir  (VALTREX) 1000 MG tablet Take 1 tablet (1,000 mg total) by mouth 3 (three) times daily. 21 tablet 0   No facility-administered medications prior to visit.      EXAM:  BP 122/82 (BP Location: Right Arm, Patient Position: Sitting, Cuff Size: Normal)   Temp 97.8 F (36.6 C) (Oral)   Wt 111 lb (50.3 kg)   BMI 18.61 kg/m   Body mass index is 18.61 kg/m.  GENERAL: vitals reviewed and listed above, alert, oriented, appears well hydrated and in no acute distress HEENT: atraumatic, conjunctiva  clear, no obvious abnormalities on inspection of external nose and ears OP : no lesion edema or exudate  NECK: no obvious masses on inspection palpation Thyroid is palpable. LUNGS: clear to auscultation bilaterally, no wheezes, rales or rhonchi, good air movement CV: HRRR, no clubbing cyanosis or  peripheral edema nl cap refill  Abdomen:  Sof,t normal bowel sounds without hepatosplenomegaly, no guarding rebound or masses no CVA tenderness MS: moves all extremities without noticeable focal  abnormality PSYCH: pleasant and cooperative, no obvious depression or anxiety  BP Readings from Last 3 Encounters:  10/19/16 122/82  05/11/16 (!) 150/80  07/02/15 126/86   Wt Readings from Last 3 Encounters:  10/19/16 111 lb (50.3 kg)  05/11/16 115 lb 6.4 oz (52.3 kg)  07/02/15 126 lb 4.8 oz (57.3 kg)     ASSESSMENT AND PLAN:  Discussed the following assessment and plan:  Essential hypertension - Controlled - Plan: Basic metabolic panel, CBC with Differential/Platelet, Hepatic function panel, TSH, T4, free  Loss of weight - Plan: Basic metabolic panel, CBC with Differential/Platelet, Hepatic function panel, TSH, T4, free  Tobacco dependence  Medication management - Plan: Basic metabolic panel, CBC with Differential/Platelet, Hepatic function panel, TSH, T4, free  Sleep disorder, circadian, shift work type - Plan: Basic metabolic panel, CBC with Differential/Platelet, Hepatic function panel, TSH, T4,  free  Vitamin D deficiency - Plan: VITAMIN D 25 Hydroxy (Vit-D Deficiency, Fractures) Canceled and discuss future prevention healthcare issues she may re-address the lung cancer screening and let us know if needed referral. She will get her GYN appointment and mammogram and update her colonoscopy. She was at [facility] to [facility] docs. Discussed transition retirement I think she would do better  sleeping at night and being up in the day and have a day light cycles more physiologic. Tobacco still risk. Laboratory studies today for monitoring and her history of vitamin D deficiency. Follow-up depending on results how she is doing and yearly. She could even consider counseling in transition. If she decides to retire. Discussed tracking sleep blood pressure and activities. Total visit 50mns > 50% spent counseling and coordinating care as indicated in above note and in instructions to patient .   -Patient advised to return or notify health care team  if symptoms worsen ,persist or new concerns arise.  Patient Instructions  Will notify you  of labs when available.   Get  Your screening  .  As planned .  bp seems  Good .  Today . No change . Check in to the lung cancer screening  Again .  Plan fu  Depending on results        WStandley Brooking Brandyn Lowrey M.D.

## 2016-11-01 ENCOUNTER — Other Ambulatory Visit: Payer: Self-pay | Admitting: Family Medicine

## 2016-11-01 MED ORDER — VITAMIN D (ERGOCALCIFEROL) 1.25 MG (50000 UNIT) PO CAPS: 50000.0000 [IU] | ORAL_CAPSULE | ORAL | 0 refills | Status: DC

## 2016-11-01 MED FILL — GABAPENTIN 300 MG CAPSULE: 300 | 90 days supply | Qty: 270 | Fill #1

## 2016-11-02 MED FILL — VIT D2 1.25 MG (50,000 UNIT: 1.25 MG | 84 days supply | Qty: 12 | Fill #0

## 2016-12-04 MED FILL — TRINTELLIX 20 MG TABLET: 20 | 90 days supply | Qty: 90 | Fill #0

## 2016-12-04 MED FILL — CHLORDIAZEPOXIDE 5 MG CAP: 5 | 90 days supply | Qty: 270 | Fill #0

## 2016-12-12 MED FILL — ZOLPIDEM TARTRATE 10 MG TAB: 10 | 90 days supply | Qty: 90 | Fill #0

## 2017-01-16 MED FILL — LOSARTAN-HCTZ 100-12.5 MG T: 100-12.5 | 90 days supply | Qty: 90 | Fill #1

## 2017-01-18 MED FILL — ARMODAFINIL 150 MG TABLET: 150 | 90 days supply | Qty: 90 | Fill #0

## 2017-02-14 MED FILL — traZODone HCL 50 MG TABS: 50 | 90 days supply | Qty: 90 | Fill #0

## 2017-03-13 MED FILL — ZOLPIDEM TARTRATE 10 MG TAB: 10 | 90 days supply | Qty: 90 | Fill #0

## 2017-03-15 MED FILL — CHLORDIAZEPOXIDE 5 MG CAP: 5 | 90 days supply | Qty: 270 | Fill #0

## 2017-03-15 MED FILL — GABAPENTIN 300 MG CAPSULE: 300 | 90 days supply | Qty: 270 | Fill #0

## 2017-04-02 MED FILL — TRINTELLIX 20 MG TABLET: 20 | 90 days supply | Qty: 90 | Fill #0

## 2017-04-30 MED FILL — LOSARTAN-HCTZ 100-12.5 MG T: 100-12.5 | 90 days supply | Qty: 90 | Fill #2

## 2017-05-02 MED FILL — ARMODAFINIL 150 MG TABLET: 150 | 90 days supply | Qty: 90 | Fill #0

## 2017-06-12 MED FILL — ZOLPIDEM TARTRATE 10 MG TAB: 10 | 90 days supply | Qty: 90 | Fill #0

## 2017-06-15 ENCOUNTER — Encounter: Payer: Self-pay | Admitting: Internal Medicine

## 2017-07-09 MED FILL — GABAPENTIN 300 MG CAPSULE: 300 | 90 days supply | Qty: 270 | Fill #1

## 2017-07-10 MED FILL — TRINTELLIX 20 MG TABLET: 20 | 90 days supply | Qty: 90 | Fill #1

## 2017-07-31 MED FILL — NAPROXEN 500 MG TABLET: 500 | 4 days supply | Qty: 12 | Fill #0

## 2017-07-31 MED FILL — CLINDAMYCIN HCL 150 MG CAPS: 150 | 7 days supply | Qty: 28 | Fill #0

## 2017-08-07 MED FILL — ARMODAFINIL 150 MG TABLET: 150 | 90 days supply | Qty: 90 | Fill #0

## 2017-08-08 MED FILL — BUPROPION HCL XL 150 MG TAB: 150 | 90 days supply | Qty: 90 | Fill #0

## 2017-08-28 DIAGNOSIS — H31091 Other chorioretinal scars, right eye: Secondary | ICD-10-CM | POA: Diagnosis not present

## 2017-08-28 DIAGNOSIS — H16223 Keratoconjunctivitis sicca, not specified as Sjogren's, bilateral: Secondary | ICD-10-CM | POA: Diagnosis not present

## 2017-08-28 DIAGNOSIS — H40033 Anatomical narrow angle, bilateral: Secondary | ICD-10-CM | POA: Diagnosis not present

## 2017-08-28 DIAGNOSIS — H2513 Age-related nuclear cataract, bilateral: Secondary | ICD-10-CM | POA: Diagnosis not present

## 2017-08-28 MED FILL — RESTASIS 0.05% EYE EMULSION: 0.05 | 90 days supply | Qty: 180 | Fill #0

## 2017-08-29 MED FILL — CHLORDIAZEPOXIDE 5 MG CAP: 5 | 90 days supply | Qty: 270 | Fill #0

## 2017-08-30 MED FILL — LOSARTAN-HCTZ 100-12.5 MG T: 100-12.5 | 90 days supply | Qty: 90 | Fill #3

## 2017-09-10 MED FILL — ZOLPIDEM TARTRATE 10 MG TAB: 10 | 90 days supply | Qty: 90 | Fill #0

## 2017-09-24 DIAGNOSIS — H2511 Age-related nuclear cataract, right eye: Secondary | ICD-10-CM | POA: Diagnosis not present

## 2017-09-25 HISTORY — PX: BREAST BIOPSY: SHX20

## 2017-09-26 ENCOUNTER — Telehealth: Payer: Self-pay | Admitting: Internal Medicine

## 2017-09-26 DIAGNOSIS — H269 Unspecified cataract: Secondary | ICD-10-CM

## 2017-09-26 NOTE — Telephone Encounter (Addendum)
Copied from [place]. Topic: Quick Communication - See Telephone Encounter >> Sep 26, 2017  4:14 PM Oneta Rack wrote: CRM for notification. See Telephone encounter for:   09/26/17.   Relation to pt: self Call back number: [phone]   Reason for call:  Patient states due to new "focus" plan referral is needed, patient requesting referral to ophthalmologist Dr. Warden Fillers post op surgery w up appointment in 2 weeks for cataract surgery.

## 2017-09-27 NOTE — Telephone Encounter (Signed)
Of course she should have post op check with her surgeon     Do we really have to do a referral>  If so then ok to do .

## 2017-09-27 NOTE — Telephone Encounter (Signed)
Please advise Dr Regis Bill on referral needed, thanks.

## 2017-09-28 NOTE — Telephone Encounter (Signed)
Referral placed ATC patient several times, phone line disconnected.  WCB later

## 2017-10-03 NOTE — Telephone Encounter (Signed)
ATC several times to number listed - disconnected ATC work number and unable to get through to patient.  Message sent on mychart.

## 2017-11-04 NOTE — Progress Notes (Signed)
Chief Complaint  Patient presents with  . Blood Pressure Check    HPI: Jasmine Nelson 62 y.o. come in for Chronic disease management  Yearly check for a number of issues   Bp  Check  sometims  140  70 - 80  But taking med ok   Still tired all the time    Feels 24 hours need  Bothering her at times .   Stress with  Work place changes  And decisions  Still working nights  Sees dr Toy Care   Suggested  Counseling  Opal Sidles perrin  Cataracts surgery done.    eats lots of carbs.   Has lost some weight   still tobacco no sob .  Sleeps a lot days off / if could have osa   Over due for pap and had abnormal in past  Her gyne retired  But now has appt for new gyne.  Over due for colon screen  Has sibling had colon cancer   Please refer  Dr Delane Ginger  Female preferred if possible.    ROS: See pertinent positives and negatives per HPI. ? weight   Not trying to lose weight.   Past Medical History:  Diagnosis Date  . Chicken pox   . Glaucoma   . Hamstring injury    torn  . Hx of thyroid disease    3rd grade  resolved   . Ingestion of substance    3 04  . Shift work sleep disorder     Family History  Problem Relation Age of Onset  . Cancer Mother 46       Died at 28 of Non-Hodgkins Lymphoma  . Lymphoma Mother        18  . Colon polyps Mother        "many"; required regular colonoscopies  . Alcohol abuse Father   . COPD Father   . Depression Father   . Hyperlipidemia Father   . Hypertension Father        deceased  . Colon polyps Father        "many"; required regular colonoscopies  . Colon cancer Sister        dx. 21-56  . Other Sister        +CHEK2 mutation  . COPD Maternal Aunt   . Ovarian cancer Maternal Aunt 62  . Stroke Maternal Grandmother   . Heart Problems Maternal Grandmother   . Cancer Maternal Grandfather 27       Brain Cancer  . Arthritis Paternal Grandmother   . Depression Paternal Grandmother   . Glaucoma Paternal Grandmother   . Breast cancer Paternal  Grandmother        dx. 109s  . Uterine cancer Paternal Grandmother        dx. 11s  . Renal cancer Paternal Grandfather        smoker  . Multiple myeloma Maternal Uncle        dx. well before his 60s  . Ovarian cancer Other     Social History   Socioeconomic History  . Marital status: Married    Spouse name: None  . Number of children: None  . Years of education: None  . Highest education level: None  Social Needs  . Financial resource strain: None  . Food insecurity - worry: None  . Food insecurity - inability: None  . Transportation needs - medical: None  . Transportation needs - non-medical: None  Occupational History  . None  Tobacco  Use  . Smoking status: Current Every Day Smoker    Packs/day: 1.00    Types: Cigarettes  . Smokeless tobacco: Never Used  Substance and Sexual Activity  . Alcohol use: No  . Drug use: No  . Sexual activity: None  Other Topics Concern  . None  Social History Narrative   Averages 5-8 hours of sleep per day.   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   P0G0       Outpatient Medications Prior to Visit  Medication Sig Dispense Refill  . Armodafinil (NUVIGIL) 150 MG tablet Take 150 mg by mouth daily.    Marland Kitchen buPROPion (WELLBUTRIN XL) 150 MG 24 hr tablet Take 150 mg by mouth daily.    . chlordiazePOXIDE (LIBRIUM) 5 MG capsule Take 5 mg by mouth 2 (two) times daily.   0  . gabapentin (NEURONTIN) 300 MG capsule Take 300 mg by mouth 2 (two) times daily.    Marland Kitchen losartan-hydrochlorothiazide (HYZAAR) 100-12.5 MG tablet Take 1 tablet by mouth daily. 90 tablet 3  . vortioxetine HBr (TRINTELLIX) 20 MG TABS Take 20 mg by mouth daily.    Marland Kitchen zolpidem (AMBIEN) 10 MG tablet Take 10 mg by mouth at bedtime as needed for sleep.    . Cholecalciferol (VITAMIN D PO) Take by mouth.    . Vitamin D, Ergocalciferol,  (DRISDOL) 50000 units CAPS capsule Take 1 capsule (50,000 Units total) by mouth every 7 (seven) days. (Patient not taking: Reported on 11/06/2017) 12 capsule 0  . Vortioxetine HBr (TRINTELLIX) 10 MG TABS Take 1 tablet by mouth daily.     No facility-administered medications prior to visit.      EXAM:  BP 122/74 (BP Location: Left Arm, Patient Position: Sitting, Cuff Size: Normal)   Pulse 84   Temp 98.2 F (36.8 C) (Oral)   Wt 111 lb 3.2 oz (50.4 kg)   BMI 18.65 kg/m   Body mass index is 18.65 kg/m.  GENERAL: vitals reviewed and listed above, alert, oriented, appears well hydrated and in no acute distress HEENT: atraumatic, conjunctiva  clear, no obvious abnormalities on inspection of external nose and ears OP : no lesion edema or exudate  NECK: no obvious masses on inspection palpation  LUNGS: clear to auscultation bilaterally, no wheezes, rales or rhonchi,mild kyphois  No  ? Dec bs CV: HRRR, no clubbing cyanosis or  peripheral edema nl cap refill  Abdomen:  Sof,t normal bowel sounds without hepatosplenomegaly, no guarding rebound or masses no CVA tenderness MS: moves all extremities without noticeable focal  abnormality PSYCH: pleasant and cooperative, no obvious depression or anxiety   Lab Results  Component Value Date   WBC 7.3 10/19/2016   HGB 12.6 10/19/2016   HCT 36.9 10/19/2016   PLT 319.0 10/19/2016   GLUCOSE 74 10/19/2016   CHOL 197 05/10/2015   TRIG 78.0 05/10/2015   HDL 51.10 05/10/2015   LDLCALC 130 (H) 05/10/2015   ALT 13 10/19/2016   AST 16 10/19/2016   NA 139 10/19/2016   K 4.7 10/19/2016   CL 101 10/19/2016   CREATININE 0.74 10/19/2016   BUN 12 10/19/2016   CO2 33 (H) 10/19/2016   TSH 1.64 10/19/2016   HGBA1C 5.5 07/02/2015   BP Readings from Last 3 Encounters:  11/06/17 122/74  10/19/16 122/82  05/11/16 (!) 150/80   Wt Readings from Last 3 Encounters:  11/06/17 111 lb 3.2 oz (50.4 kg)  10/19/16 111 lb (50.3 kg)  05/11/16 115 lb 6.4 oz (52.3 kg)      ASSESSMENT AND PLAN:  Discussed the following assessment and plan:  Essential hypertension - Plan: Basic metabolic panel, CBC with Differential/Platelet, Hemoglobin A1c, Hepatic function panel, Lipid panel, TSH, T4, free, POCT Urinalysis Dipstick (Automated)  Medication management - Plan: Basic metabolic panel, CBC with Differential/Platelet, Hemoglobin A1c, Hepatic function panel, Lipid panel, TSH, T4, free, POCT Urinalysis Dipstick (Automated)  Other fatigue - Plan: Basic metabolic panel, CBC with Differential/Platelet, Hemoglobin A1c, Hepatic function panel, Lipid panel, TSH, T4, free, POCT Urinalysis Dipstick (Automated)  Family history of malignant neoplasm of colon - Plan: Basic metabolic panel, CBC with Differential/Platelet, Hemoglobin A1c, Hepatic function panel, Lipid panel, TSH, T4, free, POCT Urinalysis Dipstick (Automated), Ambulatory referral to Gastroenterology  Screening for colon cancer - Plan: Ambulatory referral to Gastroenterology  Weight loss - Plan: Basic metabolic panel, CBC with Differential/Platelet, Hemoglobin A1c, Hepatic function panel, Lipid panel, TSH, T4, free, POCT Urinalysis Dipstick (Automated), DG Chest 2 View  Tobacco user - Plan: DG Chest 2 View  Vitamin D deficiency - Plan: VITAMIN D 25 Hydroxy (Vit-D Deficiency, Fractures)  Sleep disorder, circadian, shift work type  over due for pap and dexa and  Mammogram.  And colon screening   Has appt with  Gyne.    Get c ray when convenient women  Has had refer for lung cancer screening in past but didn't fu  Advise  Counseling about life changes   That could be causing excess fatigue and stress.   Continue anti ht med  And monitor ing labs today etc  Plan fu depending on lab  And weight and sx   Or at least yearly  -Patient advised to return or notify health care team  if  new concerns arise.  Patient Instructions    Will refer to gi  As planned.  Will get  Labs  Monitoring .   bp is ol today Get  your  mammogram nd pap smear.  [facility] .   Agree with counseling .      Standley Brooking. Panosh M.D.

## 2017-11-05 MED FILL — buPROPion HCL ER (XL) 150 M: 150 | 90 days supply | Qty: 90 | Fill #1

## 2017-11-05 MED FILL — GABAPENTIN 300 MG CAPS: 300 | 90 days supply | Qty: 270 | Fill #0

## 2017-11-05 MED FILL — TRINTELLIX 20 MG TABLET: 20 | 90 days supply | Qty: 90 | Fill #2

## 2017-11-06 ENCOUNTER — Encounter: Payer: Self-pay | Admitting: Internal Medicine

## 2017-11-06 ENCOUNTER — Ambulatory Visit (INDEPENDENT_AMBULATORY_CARE_PROVIDER_SITE_OTHER): Payer: No Typology Code available for payment source | Admitting: Internal Medicine

## 2017-11-06 VITALS — BP 122/74 | HR 84 | Temp 98.2°F | Wt 111.2 lb

## 2017-11-06 DIAGNOSIS — G4726 Circadian rhythm sleep disorder, shift work type: Secondary | ICD-10-CM | POA: Diagnosis not present

## 2017-11-06 DIAGNOSIS — Z800 Family history of malignant neoplasm of digestive organs: Secondary | ICD-10-CM | POA: Diagnosis not present

## 2017-11-06 DIAGNOSIS — Z1211 Encounter for screening for malignant neoplasm of colon: Secondary | ICD-10-CM

## 2017-11-06 DIAGNOSIS — R634 Abnormal weight loss: Secondary | ICD-10-CM | POA: Diagnosis not present

## 2017-11-06 DIAGNOSIS — Z79899 Other long term (current) drug therapy: Secondary | ICD-10-CM | POA: Diagnosis not present

## 2017-11-06 DIAGNOSIS — R5383 Other fatigue: Secondary | ICD-10-CM

## 2017-11-06 DIAGNOSIS — Z720 Tobacco use: Secondary | ICD-10-CM

## 2017-11-06 DIAGNOSIS — I10 Essential (primary) hypertension: Secondary | ICD-10-CM | POA: Diagnosis not present

## 2017-11-06 DIAGNOSIS — E559 Vitamin D deficiency, unspecified: Secondary | ICD-10-CM

## 2017-11-06 LAB — VITAMIN D 25 HYDROXY (VIT D DEFICIENCY, FRACTURES): VITD: 11.57 ng/mL — ABNORMAL LOW (ref 30.00–100.00)

## 2017-11-06 LAB — POC URINALSYSI DIPSTICK (AUTOMATED)
BILIRUBIN UA: NEGATIVE
GLUCOSE UA: NEGATIVE
Ketones, UA: NEGATIVE
LEUKOCYTES UA: NEGATIVE
Nitrite, UA: NEGATIVE
Protein, UA: NEGATIVE
Spec Grav, UA: 1.02 (ref 1.010–1.025)
Urobilinogen, UA: 0.2 E.U./dL
pH, UA: 6 (ref 5.0–8.0)

## 2017-11-06 LAB — LIPID PANEL
CHOLESTEROL: 163 mg/dL (ref 0–200)
HDL: 58 mg/dL (ref >39.00)
LDL CALC: 65 mg/dL (ref 0–99)
NonHDL: 105.48
TRIGLYCERIDES: 200 mg/dL — AB (ref 0.0–149.0)
Total CHOL/HDL Ratio: 3
VLDL: 40 mg/dL (ref 0.0–40.0)

## 2017-11-06 LAB — CBC WITH DIFFERENTIAL/PLATELET
BASOS ABS: 0.1 10*3/uL (ref 0.0–0.1)
Basophils Relative: 0.9 % (ref 0.0–3.0)
EOS ABS: 0.2 10*3/uL (ref 0.0–0.7)
Eosinophils Relative: 2.5 % (ref 0.0–5.0)
HEMATOCRIT: 37 % (ref 36.0–46.0)
HEMOGLOBIN: 12.7 g/dL (ref 12.0–15.0)
LYMPHS PCT: 23.6 % (ref 12.0–46.0)
Lymphs Abs: 1.5 10*3/uL (ref 0.7–4.0)
MCHC: 34.3 g/dL (ref 30.0–36.0)
MCV: 91.3 fl (ref 78.0–100.0)
MONOS PCT: 8.4 % (ref 3.0–12.0)
Monocytes Absolute: 0.5 10*3/uL (ref 0.1–1.0)
Neutro Abs: 4 10*3/uL (ref 1.4–7.7)
Neutrophils Relative %: 64.6 % (ref 43.0–77.0)
Platelets: 351 10*3/uL (ref 150.0–400.0)
RBC: 4.06 Mil/uL (ref 3.87–5.11)
RDW: 12.8 % (ref 11.5–15.5)
WBC: 6.2 10*3/uL (ref 4.0–10.5)

## 2017-11-06 LAB — BASIC METABOLIC PANEL
BUN: 9 mg/dL (ref 6–23)
CALCIUM: 9.4 mg/dL (ref 8.4–10.5)
CHLORIDE: 95 meq/L — AB (ref 96–112)
CO2: 31 mEq/L (ref 19–32)
CREATININE: 0.59 mg/dL (ref 0.40–1.20)
GFR: 109.61 mL/min (ref >60.00)
Glucose, Bld: 80 mg/dL (ref 70–99)
Potassium: 3.9 mEq/L (ref 3.5–5.1)
SODIUM: 134 meq/L — AB (ref 135–145)

## 2017-11-06 LAB — HEMOGLOBIN A1C: Hgb A1c MFr Bld: 5.8 % (ref 4.6–6.5)

## 2017-11-06 LAB — HEPATIC FUNCTION PANEL
ALT: 12 U/L (ref 0–35)
AST: 15 U/L (ref 0–37)
Albumin: 4.2 g/dL (ref 3.5–5.2)
Alkaline Phosphatase: 105 U/L (ref 39–117)
Bilirubin, Direct: 0 mg/dL (ref 0.0–0.3)
Total Bilirubin: 0.3 mg/dL (ref 0.2–1.2)
Total Protein: 6.3 g/dL (ref 6.0–8.3)

## 2017-11-06 LAB — TSH: TSH: 1.49 u[IU]/mL (ref 0.35–4.50)

## 2017-11-06 LAB — T4, FREE: Free T4: 0.71 ng/dL (ref 0.60–1.60)

## 2017-11-06 MED FILL — ARMODAFINIL 150 MG TABLET: 150 | 90 days supply | Qty: 90 | Fill #0

## 2017-11-06 NOTE — Patient Instructions (Signed)
   Will refer to gi  As planned.  Will get  Labs  Monitoring .   bp is ol today Get your  mammogram nd pap smear.  Ill put [facility] .   Agree with counseling .

## 2017-11-12 ENCOUNTER — Telehealth: Payer: Self-pay | Admitting: Internal Medicine

## 2017-11-12 DIAGNOSIS — E559 Vitamin D deficiency, unspecified: Secondary | ICD-10-CM

## 2017-11-12 NOTE — Telephone Encounter (Signed)
Copied from [place] [id number]. Topic: General - Other >> Nov 12, 2017  3:50 PM Jasmine Nelson wrote: Reason for CRM: PATIENT calling stating that she need lab results and that the patient works at [employer] in the [place] and they cant see where the provider ordered on Chest Xray  please call pt about labs and the order for chest xray

## 2017-11-13 NOTE — Telephone Encounter (Signed)
Left detailed message about lab results.  Aware of rec's for Vit D OTC Order placed for CXR and VIt D lab recheck (54months) -- pt aware to call back for lab appt in 7months.   Panosh, Standley Brooking, MD  Virl Cagey, CMA    Lab results are normal except  Vitamin D is low  And triglycerides slightly Up .  No Cause for your weight loss is noted  Please take Every day 2000 iu per day of vit d and recheck vit d level in 3 months  Get the chest x ray Also

## 2017-11-21 ENCOUNTER — Telehealth: Payer: Self-pay | Admitting: Internal Medicine

## 2017-11-21 NOTE — Telephone Encounter (Signed)
The    [place], [place] - [place] [phone] (Phone) [phone] (Fax

## 2017-11-21 NOTE — Telephone Encounter (Signed)
Please advise Dr Panosh, thanks.   

## 2017-11-21 NOTE — Telephone Encounter (Signed)
Patient would like to see if she can get a Rx for her Vitamin D to take once a week instead of her having to take it everyday. She has a hard time remembering to take it once a day with her busy schedule. If you can prescribe the Vitamin D she would like to have it sent to:  [place], [place] - [place] [phone] (Phone) [phone] (Fax)    Please advise

## 2017-11-22 MED ORDER — VITAMIN D (ERGOCALCIFEROL) 1.25 MG (50000 UNIT) PO CAPS: 50000.0000 [IU] | ORAL_CAPSULE | ORAL | 0 refills | Status: DC

## 2017-11-22 NOTE — Telephone Encounter (Signed)
Sent in 12 weeks of medication

## 2017-11-23 NOTE — Telephone Encounter (Signed)
Pt aware that Vitamin D Rx has been refilled to her pharmacy.  Left detailed message on verified voicemail. Nothing further needed.

## 2017-11-28 MED FILL — VIT D2 1.25 MG (50,000 UNIT: 1.25 MG | 84 days supply | Qty: 12 | Fill #0

## 2017-12-06 MED FILL — ZOLPIDEM TARTRATE 10 MG TAB: 10 | 90 days supply | Qty: 90 | Fill #0

## 2017-12-14 ENCOUNTER — Encounter: Payer: Self-pay | Admitting: Gastroenterology

## 2017-12-17 ENCOUNTER — Other Ambulatory Visit: Payer: Self-pay | Admitting: Internal Medicine

## 2017-12-18 MED FILL — LOSARTAN-HCTZ 100-12.5 MG T: 100-12.5 | 90 days supply | Qty: 90 | Fill #0

## 2017-12-26 ENCOUNTER — Ambulatory Visit (HOSPITAL_COMMUNITY)
Admission: RE | Admit: 2017-12-26 | Discharge: 2017-12-26 | Disposition: A | Discharge status: Home / Self Care | Payer: No Typology Code available for payment source | Source: Ambulatory Visit | Attending: Internal Medicine | Admitting: Internal Medicine

## 2017-12-26 DIAGNOSIS — Z720 Tobacco use: Secondary | ICD-10-CM | POA: Diagnosis not present

## 2017-12-26 DIAGNOSIS — R634 Abnormal weight loss: Secondary | ICD-10-CM | POA: Diagnosis present

## 2017-12-26 DIAGNOSIS — J449 Chronic obstructive pulmonary disease, unspecified: Secondary | ICD-10-CM | POA: Insufficient documentation

## 2017-12-27 LAB — HM DEXA SCAN

## 2017-12-28 ENCOUNTER — Encounter: Payer: Self-pay | Admitting: Gastroenterology

## 2017-12-28 ENCOUNTER — Ambulatory Visit (AMBULATORY_SURGERY_CENTER): Payer: Self-pay | Admitting: *Deleted

## 2017-12-28 ENCOUNTER — Other Ambulatory Visit: Payer: Self-pay

## 2017-12-28 VITALS — Ht 66.5 in | Wt 114.8 lb

## 2017-12-28 DIAGNOSIS — Z800 Family history of malignant neoplasm of digestive organs: Secondary | ICD-10-CM

## 2017-12-28 MED ORDER — NA SULFATE-K SULFATE-MG SULF 17.5-3.13-1.6 GM/177ML PO SOLN: 1.0000 [IU] | Freq: Once | ORAL | 0 refills | Status: AC

## 2017-12-28 MED FILL — SUPREP BOWEL PREP KIT: 17.5-3.13-1 | 1 days supply | Qty: 354 | Fill #0

## 2017-12-28 NOTE — Progress Notes (Signed)
No egg or soy allergy known to patient  No issues with past sedation with any surgeries  or procedures, no intubation problems  No diet pills per patient No home 02 use per patient  No blood thinners per patient  Pt denies issues with constipation  No A fib or A flutter  EMMI video sent to pt's e mail  Pt. declined 

## 2018-01-01 ENCOUNTER — Other Ambulatory Visit: Payer: Self-pay | Admitting: Obstetrics & Gynecology

## 2018-01-01 DIAGNOSIS — R928 Other abnormal and inconclusive findings on diagnostic imaging of breast: Secondary | ICD-10-CM

## 2018-01-03 ENCOUNTER — Encounter: Payer: Self-pay | Admitting: Gastroenterology

## 2018-01-03 ENCOUNTER — Ambulatory Visit (AMBULATORY_SURGERY_CENTER): Payer: No Typology Code available for payment source | Admitting: Gastroenterology

## 2018-01-03 ENCOUNTER — Other Ambulatory Visit: Payer: Self-pay

## 2018-01-03 ENCOUNTER — Encounter: Payer: Self-pay | Admitting: Internal Medicine

## 2018-01-03 VITALS — BP 147/89 | HR 80 | Temp 98.4°F | Resp 15 | Ht 66.5 in | Wt 114.0 lb

## 2018-01-03 DIAGNOSIS — Z1211 Encounter for screening for malignant neoplasm of colon: Secondary | ICD-10-CM | POA: Diagnosis not present

## 2018-01-03 DIAGNOSIS — D124 Benign neoplasm of descending colon: Secondary | ICD-10-CM

## 2018-01-03 DIAGNOSIS — Z800 Family history of malignant neoplasm of digestive organs: Secondary | ICD-10-CM

## 2018-01-03 DIAGNOSIS — K635 Polyp of colon: Secondary | ICD-10-CM | POA: Diagnosis not present

## 2018-01-03 DIAGNOSIS — D125 Benign neoplasm of sigmoid colon: Secondary | ICD-10-CM

## 2018-01-03 MED ORDER — SODIUM CHLORIDE 0.9 % IV SOLN: 500.0000 mL | Freq: Once | INTRAVENOUS | Status: DC

## 2018-01-03 NOTE — Progress Notes (Signed)
Pt's states no medical or surgical changes since previsit or office visit. 

## 2018-01-03 NOTE — Patient Instructions (Signed)
YOU HAD AN ENDOSCOPIC PROCEDURE TODAY AT [facility]:   Refer to the procedure report that was given to you for any specific questions about what was found during the examination.  If the procedure report does not answer your questions, please call your gastroenterologist to clarify.  If you requested that your care partner not be given the details of your procedure findings, then the procedure report has been included in a sealed envelope for you to review at your convenience later.  YOU SHOULD EXPECT: Some feelings of bloating in the abdomen. Passage of more gas than usual.  Walking can help get rid of the air that was put into your GI tract during the procedure and reduce the bloating. If you had a lower endoscopy (such as a colonoscopy or flexible sigmoidoscopy) you may notice spotting of blood in your stool or on the toilet paper. If you underwent a bowel prep for your procedure, you may not have a normal bowel movement for a few days.  Please Note:  You might notice some irritation and congestion in your nose or some drainage.  This is from the oxygen used during your procedure.  There is no need for concern and it should clear up in a day or so.  SYMPTOMS TO REPORT IMMEDIATELY:   Following lower endoscopy (colonoscopy or flexible sigmoidoscopy):  Excessive amounts of blood in the stool  Significant tenderness or worsening of abdominal pains  Swelling of the abdomen that is new, acute  Fever of 100F or higher  No NSAIDS for 2 weeks. Please see handouts given to you on polyps, Hemorrhoids and Diveriticulosis.   For urgent or emergent issues, a gastroenterologist can be reached at any hour by calling [phone].   DIET:  We do recommend a small meal at first, but then you may proceed to your regular diet.  Drink plenty of fluids but you should avoid alcoholic beverages for 24 hours.  ACTIVITY:  You should plan to take it easy for the rest of today and you should NOT  DRIVE or use heavy machinery until tomorrow (because of the sedation medicines used during the test).    FOLLOW UP: Our staff will call the number listed on your records the next business day following your procedure to check on you and address any questions or concerns that you may have regarding the information given to you following your procedure. If we do not reach you, we will leave a message.  However, if you are feeling well and you are not experiencing any problems, there is no need to return our call.  We will assume that you have returned to your regular daily activities without incident.  If any biopsies were taken you will be contacted by phone or by letter within the next 1-3 weeks.  Please call us at [phone] if you have not heard about the biopsies in 3 weeks.    SIGNATURES/CONFIDENTIALITY: You and/or your care partner have signed paperwork which will be entered into your electronic medical record.  These signatures attest to the fact that that the information above on your After Visit Summary has been reviewed and is understood.  Full responsibility of the confidentiality of this discharge information lies with you and/or your care-partner.  Thank you for letting us take care of your healthcare needs today.

## 2018-01-03 NOTE — Progress Notes (Signed)
To PACU, vss patent aw report to rn 

## 2018-01-03 NOTE — Op Note (Signed)
[facility] Patient Name: Jasmine Nelson Procedure Date: 01/03/2018 8:10 AM MRN: [id number] Endoscopist: Mauri Pole , MD Age: 62 Referring MD:  Date of Birth: 05/21/1956 Gender: Female Account #: [id number] Procedure:                Colonoscopy Indications:              Screening in patient at increased risk: Colorectal                            cancer in sister before age 74 Medicines:                Monitored Anesthesia Care Procedure:                Pre-Anesthesia Assessment:                           - Prior to the procedure, a History and Physical                            was performed, and patient medications and                            allergies were reviewed. The patient's tolerance of                            previous anesthesia was also reviewed. The risks                            and benefits of the procedure and the sedation                            options and risks were discussed with the patient.                            All questions were answered, and informed consent                            was obtained. Prior Anticoagulants: The patient has                            taken no previous anticoagulant or antiplatelet                            agents. ASA Grade Assessment: II - A patient with                            mild systemic disease. After reviewing the risks                            and benefits, the patient was deemed in                            satisfactory condition to undergo the procedure.  After obtaining informed consent, the colonoscope                            was passed under direct vision. Throughout the                            procedure, the patient's blood pressure, pulse, and                            oxygen saturations were monitored continuously. The                            Colonoscope was introduced through the anus and                            advanced to the the cecum,  identified by                            appendiceal orifice and ileocecal valve. The                            colonoscopy was performed without difficulty. The                            patient tolerated the procedure well. The quality                            of the bowel preparation was excellent. The                            ileocecal valve, appendiceal orifice, and rectum                            were photographed. Scope In: 8:12:29 AM Scope Out: 8:33:52 AM Scope Withdrawal Time: 0 hours 14 minutes 52 seconds  Total Procedure Duration: 0 hours 21 minutes 23 seconds  Findings:                 The perianal and digital rectal examinations were                            normal.                           A 2 mm polyp was found in the descending colon. The                            polyp was sessile. The polyp was removed with a                            cold biopsy forceps. Resection and retrieval were                            complete.  Two sessile polyps were found in the sigmoid colon.                            The polyps were 4 to 7 mm in size. These polyps                            were removed with a cold snare. Resection and                            retrieval were complete.                           A 14 mm polyp was found in the sigmoid colon. The                            polyp was semi-pedunculated. The polyp was removed                            with a hot snare. Resection and retrieval were                            complete.                           A few small-mouthed diverticula were found in the                            sigmoid colon.                           Non-bleeding internal hemorrhoids were found during                            retroflexion. The hemorrhoids were large. Complications:            No immediate complications. Estimated Blood Loss:     Estimated blood loss was minimal. Impression:               - One  2 mm polyp in the descending colon, removed                            with a cold biopsy forceps. Resected and retrieved.                           - Two 4 to 7 mm polyps in the sigmoid colon,                            removed with a cold snare. Resected and retrieved.                           - One 14 mm polyp in the sigmoid colon, removed                            with a hot snare.  Resected and retrieved.                           - Diverticulosis in the sigmoid colon.                           - Non-bleeding internal hemorrhoids. Recommendation:           - Patient has a contact number available for                            emergencies. The signs and symptoms of potential                            delayed complications were discussed with the                            patient. Return to normal activities tomorrow.                            Written discharge instructions were provided to the                            patient.                           - Resume previous diet.                           - Continue present medications.                           - Await pathology results.                           - Repeat colonoscopy in 3 - 5 years for                            surveillance based on pathology results. Mauri Pole, MD 01/03/2018 8:41:18 AM This report has been signed electronically.

## 2018-01-03 NOTE — Progress Notes (Signed)
Called to room to assist during endoscopic procedure.  Patient ID and intended procedure confirmed with present staff. Received instructions for my participation in the procedure from the performing physician.  

## 2018-01-04 ENCOUNTER — Telehealth: Payer: Self-pay | Admitting: *Deleted

## 2018-01-04 NOTE — Telephone Encounter (Signed)
  Follow up Call-  Call back number 01/03/2018  Post procedure Call Back phone  # [phone]  Permission to leave phone message Yes  Some recent data might be hidden     Patient questions:  Do you have a fever, pain , or abdominal swelling? No. Pain Score  0 *  Have you tolerated food without any problems? Yes.    Have you been able to return to your normal activities? Yes.    Do you have any questions about your discharge instructions: Diet   No. Medications  No. Follow up visit  No.  Do you have questions or concerns about your Care? No.  Actions: * If pain score is 4 or above: No action needed, pain <4.

## 2018-01-08 ENCOUNTER — Ambulatory Visit
Admission: RE | Admit: 2018-01-08 | Discharge: 2018-01-08 | Disposition: A | Discharge status: Home / Self Care | Payer: No Typology Code available for payment source | Source: Ambulatory Visit | Attending: Obstetrics & Gynecology | Admitting: Obstetrics & Gynecology

## 2018-01-08 ENCOUNTER — Other Ambulatory Visit: Payer: Self-pay | Admitting: Obstetrics & Gynecology

## 2018-01-08 DIAGNOSIS — N6310 Unspecified lump in the right breast, unspecified quadrant: Secondary | ICD-10-CM

## 2018-01-08 DIAGNOSIS — R928 Other abnormal and inconclusive findings on diagnostic imaging of breast: Secondary | ICD-10-CM

## 2018-01-09 ENCOUNTER — Encounter: Payer: Self-pay | Admitting: Gastroenterology

## 2018-01-31 MED FILL — ARMODAFINIL 200 MG TABLET: 200 | 90 days supply | Qty: 90 | Fill #0

## 2018-02-01 MED FILL — CHLORDIAZEPOXIDE 5 MG CAP: 5 | 90 days supply | Qty: 270 | Fill #0

## 2018-02-01 MED FILL — TRINTELLIX 20 MG TABLET: 20 | 90 days supply | Qty: 90 | Fill #3

## 2018-02-04 MED FILL — GABAPENTIN 300 MG CAPSULE: 300 | 90 days supply | Qty: 270 | Fill #0

## 2018-03-04 ENCOUNTER — Telehealth: Payer: Self-pay | Admitting: Internal Medicine

## 2018-03-04 NOTE — Telephone Encounter (Signed)
Dr. Panosh - FYI. Thanks. 

## 2018-03-04 NOTE — Telephone Encounter (Signed)
Patient is calling to discuss her Vitamin D level. She had her bone density level checked and she has Penia in her hip and osis in her back. She is going to see Linda Hedges at her GYN and she is going to have her check the Vitamin D since she is due. She wanted to make sure her PCP was aware.

## 2018-03-06 MED FILL — VIT D2 1.25 MG (50,000 UNIT: 1.25 MG | 56 days supply | Qty: 8 | Fill #0

## 2018-03-06 MED FILL — ZOLPIDEM TARTRATE 10 MG TAB: 10 | 90 days supply | Qty: 90 | Fill #0

## 2018-03-15 MED FILL — NAPROXEN 500 MG TABLET: 500 | 4 days supply | Qty: 12 | Fill #0

## 2018-03-15 MED FILL — AMOXICILLIN 500 MG CAPSULE: 500 | 7 days supply | Qty: 28 | Fill #0

## 2018-03-25 ENCOUNTER — Other Ambulatory Visit: Payer: Self-pay | Admitting: Internal Medicine

## 2018-03-26 MED FILL — LOSARTAN-HCTZ 100-12.5 MG T: 100-12.5 | 90 days supply | Qty: 90 | Fill #0

## 2018-03-26 NOTE — Telephone Encounter (Signed)
Pt aware that his Rx has been called into the pharmacy, left detailed message on voicemail. Nothing further needed.

## 2018-03-26 NOTE — Telephone Encounter (Signed)
Will send to Jasmine Nelson to make patient aware Rx has been sent in.

## 2018-03-26 NOTE — Telephone Encounter (Signed)
Copied from [place] [id number]. Topic: Quick Communication - Rx Refill/Question >> Mar 26, 2018 10:16 AM Mylinda Latina, NT wrote: Medication: losartan-hydrochlorothiazide (HYZAAR) 100-12.5 MG tablet  Has the patient contacted their pharmacy? Yes.  Was told to call office, no refills (Agent: If no, request that the patient contact the pharmacy for the refill.) (Agent: If yes, when and what did the pharmacy advise?)  Preferred Pharmacy (with phone number or street name): [place], [place] - [place] [phone] (Phone) [phone] (Fax)      Agent: Please be advised that RX refills may take up to 3 business days. We ask that you follow-up with your pharmacy.

## 2018-04-30 MED FILL — TRINTELLIX 20 MG TABLET: 20 | 90 days supply | Qty: 90 | Fill #0

## 2018-05-03 MED FILL — ARMODAFINIL 200 MG TABLET: 200 | 90 days supply | Qty: 90 | Fill #0

## 2018-05-09 ENCOUNTER — Ambulatory Visit (INDEPENDENT_AMBULATORY_CARE_PROVIDER_SITE_OTHER): Payer: No Typology Code available for payment source | Admitting: Psychology

## 2018-05-09 DIAGNOSIS — F4323 Adjustment disorder with mixed anxiety and depressed mood: Secondary | ICD-10-CM

## 2018-05-28 MED FILL — GABAPENTIN 300 MG CAPSULE: 300 | 90 days supply | Qty: 270 | Fill #1

## 2018-05-29 MED FILL — CHLORDIAZEPOXIDE 5 MG CAP: 5 | 90 days supply | Qty: 270 | Fill #0

## 2018-05-30 ENCOUNTER — Ambulatory Visit (INDEPENDENT_AMBULATORY_CARE_PROVIDER_SITE_OTHER): Payer: No Typology Code available for payment source | Admitting: Psychology

## 2018-05-30 DIAGNOSIS — F4323 Adjustment disorder with mixed anxiety and depressed mood: Secondary | ICD-10-CM

## 2018-06-04 MED FILL — ZOLPIDEM TARTRATE 10 MG TAB: 10 | 90 days supply | Qty: 90 | Fill #0

## 2018-06-20 ENCOUNTER — Ambulatory Visit (INDEPENDENT_AMBULATORY_CARE_PROVIDER_SITE_OTHER): Payer: No Typology Code available for payment source | Admitting: Psychology

## 2018-06-20 DIAGNOSIS — F4323 Adjustment disorder with mixed anxiety and depressed mood: Secondary | ICD-10-CM | POA: Diagnosis not present

## 2018-06-27 ENCOUNTER — Ambulatory Visit (INDEPENDENT_AMBULATORY_CARE_PROVIDER_SITE_OTHER): Payer: No Typology Code available for payment source | Admitting: Psychology

## 2018-06-27 DIAGNOSIS — F4323 Adjustment disorder with mixed anxiety and depressed mood: Secondary | ICD-10-CM | POA: Diagnosis not present

## 2018-06-28 ENCOUNTER — Other Ambulatory Visit: Payer: Self-pay | Admitting: Internal Medicine

## 2018-06-28 MED FILL — LOSARTAN-HCTZ 100-12.5 MG T: 100-12.5 | 90 days supply | Qty: 90 | Fill #0

## 2018-07-11 ENCOUNTER — Ambulatory Visit (INDEPENDENT_AMBULATORY_CARE_PROVIDER_SITE_OTHER): Payer: No Typology Code available for payment source | Admitting: Psychology

## 2018-07-11 DIAGNOSIS — F4323 Adjustment disorder with mixed anxiety and depressed mood: Secondary | ICD-10-CM | POA: Diagnosis not present

## 2018-07-18 ENCOUNTER — Ambulatory Visit (INDEPENDENT_AMBULATORY_CARE_PROVIDER_SITE_OTHER): Payer: No Typology Code available for payment source | Admitting: Psychology

## 2018-07-18 DIAGNOSIS — F4323 Adjustment disorder with mixed anxiety and depressed mood: Secondary | ICD-10-CM | POA: Diagnosis not present

## 2018-07-24 MED FILL — AZITHROMYCIN 250 MG TABLET: 250 | 5 days supply | Qty: 6 | Fill #0

## 2018-07-25 ENCOUNTER — Ambulatory Visit (INDEPENDENT_AMBULATORY_CARE_PROVIDER_SITE_OTHER): Payer: No Typology Code available for payment source | Admitting: Psychology

## 2018-07-25 DIAGNOSIS — F4323 Adjustment disorder with mixed anxiety and depressed mood: Secondary | ICD-10-CM | POA: Diagnosis not present

## 2018-07-29 MED FILL — ARMODAFINIL 250 MG TABLET: 250 | 90 days supply | Qty: 90 | Fill #0

## 2018-08-15 ENCOUNTER — Ambulatory Visit (INDEPENDENT_AMBULATORY_CARE_PROVIDER_SITE_OTHER): Payer: No Typology Code available for payment source | Admitting: Psychology

## 2018-08-15 DIAGNOSIS — F4323 Adjustment disorder with mixed anxiety and depressed mood: Secondary | ICD-10-CM | POA: Diagnosis not present

## 2018-08-26 MED FILL — TRINTELLIX 20 MG TABLET: 20 | 90 days supply | Qty: 90 | Fill #1

## 2018-08-27 MED FILL — AZITHROMYCIN 250 MG TABLET: 250 | 5 days supply | Qty: 6 | Fill #0

## 2018-08-29 ENCOUNTER — Ambulatory Visit (INDEPENDENT_AMBULATORY_CARE_PROVIDER_SITE_OTHER): Payer: No Typology Code available for payment source | Admitting: Psychology

## 2018-08-29 DIAGNOSIS — F4323 Adjustment disorder with mixed anxiety and depressed mood: Secondary | ICD-10-CM | POA: Diagnosis not present

## 2018-08-30 MED FILL — RESTASIS 0.05% EYE EMULSION: 0.05 | 90 days supply | Qty: 180 | Fill #0

## 2018-09-02 MED FILL — ZOLPIDEM TARTRATE 10 MG TAB: 10 | 90 days supply | Qty: 90 | Fill #0

## 2018-09-12 ENCOUNTER — Ambulatory Visit (INDEPENDENT_AMBULATORY_CARE_PROVIDER_SITE_OTHER): Payer: No Typology Code available for payment source | Admitting: Psychology

## 2018-09-12 DIAGNOSIS — F4323 Adjustment disorder with mixed anxiety and depressed mood: Secondary | ICD-10-CM

## 2018-10-04 ENCOUNTER — Other Ambulatory Visit: Payer: Self-pay | Admitting: Internal Medicine

## 2018-10-04 MED FILL — HYDROCHLOROTHIAZIDE 12.5 MG: 12.5 | 90 days supply | Qty: 90 | Fill #0

## 2018-10-04 MED FILL — LOSARTAN POTASSIUM 100 MG T: 100 | 90 days supply | Qty: 90 | Fill #0

## 2018-10-08 MED FILL — GABAPENTIN 300 MG CAPSULE: 300 | 90 days supply | Qty: 270 | Fill #0

## 2018-10-10 ENCOUNTER — Ambulatory Visit (INDEPENDENT_AMBULATORY_CARE_PROVIDER_SITE_OTHER): Payer: No Typology Code available for payment source | Admitting: Psychology

## 2018-10-10 DIAGNOSIS — F4323 Adjustment disorder with mixed anxiety and depressed mood: Secondary | ICD-10-CM

## 2018-10-21 ENCOUNTER — Telehealth: Payer: Self-pay | Admitting: *Deleted

## 2018-10-21 DIAGNOSIS — I10 Essential (primary) hypertension: Secondary | ICD-10-CM

## 2018-10-21 DIAGNOSIS — Z79899 Other long term (current) drug therapy: Secondary | ICD-10-CM

## 2018-10-21 DIAGNOSIS — E559 Vitamin D deficiency, unspecified: Secondary | ICD-10-CM

## 2018-10-21 DIAGNOSIS — G4726 Circadian rhythm sleep disorder, shift work type: Secondary | ICD-10-CM

## 2018-10-21 DIAGNOSIS — Z0000 Encounter for general adult medical examination without abnormal findings: Secondary | ICD-10-CM

## 2018-10-21 NOTE — Telephone Encounter (Signed)
Copied from [place] [phone]. Topic: Referral - Request for Referral >> Oct 21, 2018  4:40 PM Ivar Drape wrote: Has patient seen PCP for this complaint? NO *If NO, is insurance requiring patient see PCP for this issue before PCP can refer them?   Patient has focus Plan Referral for which specialty:  Pediatrist Preferred provider/office: [facility] - Dr. Trudie Buckler Reason for referral:   Swelling in rgt foot and ankle

## 2018-10-23 NOTE — Telephone Encounter (Signed)
Need more information before  Making correct referral. Ortho  sm vs podiatry  Please get more  Info     Did she have an injury  Or ling term foot problem or strain?    She can make an ov if not sure

## 2018-10-24 ENCOUNTER — Telehealth: Payer: Self-pay | Admitting: *Deleted

## 2018-10-24 ENCOUNTER — Ambulatory Visit (INDEPENDENT_AMBULATORY_CARE_PROVIDER_SITE_OTHER): Payer: No Typology Code available for payment source | Admitting: Psychology

## 2018-10-24 DIAGNOSIS — F4323 Adjustment disorder with mixed anxiety and depressed mood: Secondary | ICD-10-CM | POA: Diagnosis not present

## 2018-10-24 NOTE — Telephone Encounter (Signed)
Pt would like to come in early for lab work before phyiscal on 11/08/2018 Pt states she would like "everything checked" including vitamin B12

## 2018-10-24 NOTE — Telephone Encounter (Signed)
Pt states she would like to see someone in the podiatry group that is [facility]

## 2018-10-24 NOTE — Telephone Encounter (Signed)
I have talked to Jasmine Nelson and sent message to Dr.Panosh about it

## 2018-10-24 NOTE — Telephone Encounter (Signed)
Called pt and she states she did not have an injury but has been going on for awhile about 3 to 4 months and now has swelling in her ankle. Pt would like to see what provider you recommend that is the "best"

## 2018-10-24 NOTE — Telephone Encounter (Signed)
Pt called to follow up on referral. She stated she had not had an injury. Pt stated her foot and ankle has continued to swell and get worse. This is more of an ongoing problem from a few months ago. Would like to know if she needs to come in and see Dr. Regis Bill or if referral can be given. Please reach out to patient.

## 2018-10-24 NOTE — Telephone Encounter (Signed)
Copied from [place] [id number]. Topic: General - Inquiry >> Oct 24, 2018 11:37 AM Virl Axe D wrote: Reason for CRM: Pt is requesting to have her labs done prior to her Physical on 11/08/18. Would like to be able to have them drawn on Monday 10/28/18 if possble. She would like to make sure that everything is checked, including Vitamin D. Okay to leave VM [phone]

## 2018-10-28 NOTE — Telephone Encounter (Signed)
Pt calling to check on referral to podiatry. She is requesting [facility] as she has the [facility] plan thru [facility]. She has ankle pain/swelling for months and wondering if maybe this is plantar fasciitis. Pt last day at [facility] is 11/15/2018. She is needing referral sent quickly so that she can be seen before her last day.   Pt is wanting previsit labs ordered and has not heard about this. She is wanting to come in for labs Monday 11/04/2018 for previsit cpe labs, vit D, and vit B12. Physical is scheduled 11/08/2018. Pt works nights so is not able to fast for appt 2/14.  Please advise.

## 2018-10-30 ENCOUNTER — Other Ambulatory Visit: Payer: Self-pay

## 2018-10-30 DIAGNOSIS — M25579 Pain in unspecified ankle and joints of unspecified foot: Secondary | ICD-10-CM

## 2018-10-30 NOTE — Telephone Encounter (Signed)
Pt aware of message below

## 2018-10-30 NOTE — Telephone Encounter (Signed)
lvm for pt to call back. Okay for triage nurse to disclose info crm created

## 2018-10-30 NOTE — Telephone Encounter (Signed)
Ok to do  Referral for asap. I have Placed future  cpx and lab orders as requested

## 2018-10-31 ENCOUNTER — Ambulatory Visit (INDEPENDENT_AMBULATORY_CARE_PROVIDER_SITE_OTHER): Payer: No Typology Code available for payment source | Admitting: Psychology

## 2018-10-31 DIAGNOSIS — F4323 Adjustment disorder with mixed anxiety and depressed mood: Secondary | ICD-10-CM

## 2018-11-04 ENCOUNTER — Other Ambulatory Visit (INDEPENDENT_AMBULATORY_CARE_PROVIDER_SITE_OTHER): Payer: No Typology Code available for payment source

## 2018-11-04 DIAGNOSIS — E559 Vitamin D deficiency, unspecified: Secondary | ICD-10-CM | POA: Diagnosis not present

## 2018-11-04 DIAGNOSIS — G4726 Circadian rhythm sleep disorder, shift work type: Secondary | ICD-10-CM | POA: Diagnosis not present

## 2018-11-04 DIAGNOSIS — Z79899 Other long term (current) drug therapy: Secondary | ICD-10-CM | POA: Diagnosis not present

## 2018-11-04 DIAGNOSIS — I10 Essential (primary) hypertension: Secondary | ICD-10-CM | POA: Diagnosis not present

## 2018-11-04 DIAGNOSIS — Z0000 Encounter for general adult medical examination without abnormal findings: Secondary | ICD-10-CM

## 2018-11-04 LAB — HEPATIC FUNCTION PANEL
ALK PHOS: 125 U/L — AB (ref 39–117)
ALT: 12 U/L (ref 0–35)
AST: 18 U/L (ref 0–37)
Albumin: 4.1 g/dL (ref 3.5–5.2)
Bilirubin, Direct: 0.1 mg/dL (ref 0.0–0.3)
Total Bilirubin: 0.4 mg/dL (ref 0.2–1.2)
Total Protein: 6.5 g/dL (ref 6.0–8.3)

## 2018-11-04 LAB — VITAMIN B12: VITAMIN B 12: 388 pg/mL (ref 211–911)

## 2018-11-04 LAB — CBC WITH DIFFERENTIAL/PLATELET
Basophils Absolute: 0.1 10*3/uL (ref 0.0–0.1)
Basophils Relative: 1.2 % (ref 0.0–3.0)
Eosinophils Absolute: 0.2 10*3/uL (ref 0.0–0.7)
Eosinophils Relative: 3.3 % (ref 0.0–5.0)
HCT: 39.3 % (ref 36.0–46.0)
Hemoglobin: 13.5 g/dL (ref 12.0–15.0)
Lymphocytes Relative: 36.7 % (ref 12.0–46.0)
Lymphs Abs: 2.2 10*3/uL (ref 0.7–4.0)
MCHC: 34.3 g/dL (ref 30.0–36.0)
MCV: 90.3 fl (ref 78.0–100.0)
Monocytes Absolute: 0.5 10*3/uL (ref 0.1–1.0)
Monocytes Relative: 7.6 % (ref 3.0–12.0)
Neutro Abs: 3.1 10*3/uL (ref 1.4–7.7)
Neutrophils Relative %: 51.2 % (ref 43.0–77.0)
Platelets: 379 10*3/uL (ref 150.0–400.0)
RBC: 4.35 Mil/uL (ref 3.87–5.11)
RDW: 13.6 % (ref 11.5–15.5)
WBC: 6 10*3/uL (ref 4.0–10.5)

## 2018-11-04 LAB — BASIC METABOLIC PANEL
BUN: 15 mg/dL (ref 6–23)
CALCIUM: 9.5 mg/dL (ref 8.4–10.5)
CO2: 29 mEq/L (ref 19–32)
Chloride: 99 mEq/L (ref 96–112)
Creatinine, Ser: 0.68 mg/dL (ref 0.40–1.20)
GFR: 87.26 mL/min (ref >60.00)
Glucose, Bld: 65 mg/dL — ABNORMAL LOW (ref 70–99)
Potassium: 4.9 mEq/L (ref 3.5–5.1)
Sodium: 135 mEq/L (ref 135–145)

## 2018-11-04 LAB — LIPID PANEL
CHOL/HDL RATIO: 3
Cholesterol: 188 mg/dL (ref 0–200)
HDL: 59.5 mg/dL (ref >39.00)
LDL Cholesterol: 110 mg/dL — ABNORMAL HIGH (ref 0–99)
NONHDL: 128.54
Triglycerides: 94 mg/dL (ref 0.0–149.0)
VLDL: 18.8 mg/dL (ref 0.0–40.0)

## 2018-11-04 LAB — TSH: TSH: 1.59 u[IU]/mL (ref 0.35–4.50)

## 2018-11-04 LAB — VITAMIN D 25 HYDROXY (VIT D DEFICIENCY, FRACTURES): VITD: 21.68 ng/mL — AB (ref 30.00–100.00)

## 2018-11-06 NOTE — Progress Notes (Signed)
Chief Complaint  Patient presents with  . Annual Exam    Pt is concern about bilateral hip pain and back pain. Pt also has a cough that has been going on since first of the year and can not seem to get rid of the cough.   . Medication Management  . Hypertension    HPI: Patient  Jasmine Nelson  63 y.o. comes in today for Preventive Health Care visit and chronic disease management.  Hypertension taking medicine seems to be in range.  Continues to smoke knows she needs to stop has had an irritating cough that comes and goes feels that she does have underlying COPD and knows about it but nothing dramatically changing.  Does have question about lung cancer screening.  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  She sees her specialist and is still on other medications  She sees OB/GYN as a had borderline osteoporosis on last bone density last year and has had low vitamin D levels.  Last prescription was a while back.  Has a problem with right foot some tenderness in the medial retro-ankle area.  Mild swelling but no specific injury.  Hips and back bother her at times no specific injury no joint swelling otherwise.   Health Maintenance  Topic Date Due  . FOOT EXAM  05/24/1965  . OPHTHALMOLOGY EXAM  05/24/1965  . TETANUS/TDAP  05/24/1974  . MAMMOGRAM  11/08/2018 (Originally 05/27/2017)  . PAP SMEAR-Modifier  11/12/2018 (Originally 12/24/2017)  . HEMOGLOBIN A1C  12/07/2018 (Originally 05/06/2018)  . COLONOSCOPY  01/04/2023  . INFLUENZA VACCINE  Completed  . PNEUMOCOCCAL POLYSACCHARIDE VACCINE AGE 41-64 HIGH RISK  Completed  . Hepatitis C Screening  Completed  . HIV Screening  Completed   Health Maintenance Review LIFESTYLE:  Exercise:  Some right ankle foot problematic at this tin Tobacco/ETS: still  Smoking  1/2 ppd   2 at work and then  alcnoohol:  Sugar beverages:  sweet  Tea.    Sleep: shgift work on   meds under rx  Drug use: no HH of 2 plus cats . 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     ROS:  GEN/ HEENT: No fever, significant weight changes sweats headaches vision problems hearing changes, CV/ PULM; No chest pain shortness of breath ongoing cough , syncope,edema  change in exercise tolerance. GI /GU: No adominal pain, vomiting, change in bowel habits. No blood in the stool. No significant GU symptoms. SKIN/HEME: ,no acute skin rashes suspicious lesions or bleeding. No lymphadenopathy, nodules, masses.  NEURO/ PSYCH:  No neurologic signs such as weakness numbness. No depression anxiety. IMM/ Allergy: No unusual infections.  Allergy .   REST of 12 system review negative except as per HPI   Past Medical History:  Diagnosis Date  . Allergy   . Anxiety   . Cataract    both eyes  . Chicken pox   . COPD (chronic obstructive pulmonary disease) ([place])   . Depression   . Glaucoma   . Hamstring injury    torn  . Hx of thyroid disease    3rd grade  resolved   . Hypertension   . Ingestion of substance    3 04  . Osteoporosis   . Shift work sleep disorder     Past Surgical History:  Procedure Laterality Date  . ADENOIDECTOMY  1959  .  COLONOSCOPY      Family History  Problem Relation Age of Onset  . Cancer Mother 64       Died at 54 of Non-Hodgkins Lymphoma  . Lymphoma Mother        36  . Colon polyps Mother        "many"; required regular colonoscopies  . Alcohol abuse Father   . COPD Father   . Depression Father   . Hyperlipidemia Father   . Hypertension Father        deceased  . Colon polyps Father        "many"; required regular colonoscopies  . Colon cancer Sister        dx. 46-56  . Other Sister        +CHEK2 mutation  . COPD Maternal Aunt   . Ovarian cancer Maternal Aunt 62  . Stroke Maternal Grandmother   . Heart Problems Maternal Grandmother   . Cancer Maternal Grandfather 42       Brain Cancer  . Arthritis Paternal  Grandmother   . Depression Paternal Grandmother   . Glaucoma Paternal Grandmother   . Breast cancer Paternal Grandmother        dx. 4s  . Uterine cancer Paternal Grandmother        dx. 44s  . Renal cancer Paternal Grandfather        smoker  . Multiple myeloma Maternal Uncle        dx. well before his 47s  . Ovarian cancer Other   . Esophageal cancer Neg Hx   . Stomach cancer Neg Hx   . Rectal cancer Neg Hx     Social History   Socioeconomic History  . Marital status: Married    Spouse name: Not on file  . Number of children: Not on file  . Years of education: Not on file  . Highest education level: Not on file  Occupational History  . Not on file  Social Needs  . Financial resource strain: Not on file  . Food insecurity:    Worry: Not on file    Inability: Not on file  . Transportation needs:    Medical: Not on file    Non-medical: Not on file  Tobacco Use  . Smoking status: Current Every Day Smoker    Packs/day: 1.00    Types: Cigarettes  . Smokeless tobacco: Never Used  Substance and Sexual Activity  . Alcohol use: No  . Drug use: No  . Sexual activity: Not on file  Lifestyle  . Physical activity:    Days per week: Not on file    Minutes per session: Not on file  . Stress: Not on file  Relationships  . Social connections:    Talks on phone: Not on file    Gets together: Not on file    Attends religious service: Not on file    Active member of club or organization: Not on file    Attends meetings of clubs or organizations: Not on file    Relationship status: Not on file  Other Topics Concern  . Not on file  Social History Narrative   Averages 5-8 hours of sleep per day.   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  P0G0       Outpatient Medications Prior to Visit  Medication Sig Dispense Refill  .  Armodafinil (NUVIGIL) 150 MG tablet Take 150 mg by mouth daily.    . chlordiazePOXIDE (LIBRIUM) 5 MG capsule Take 5 mg by mouth 2 (two) times daily.   0  . Cholecalciferol (VITAMIN D PO) Take by mouth.    . gabapentin (NEURONTIN) 300 MG capsule Take 300 mg by mouth 3 (three) times daily.     Marland Kitchen losartan-hydrochlorothiazide (HYZAAR) 100-12.5 MG tablet TAKE 1 TABLET BY MOUTH DAILY. 90 tablet 0  . vortioxetine HBr (TRINTELLIX) 20 MG TABS Take 20 mg by mouth daily.    Marland Kitchen zolpidem (AMBIEN) 10 MG tablet Take 10 mg by mouth at bedtime as needed for sleep.    Marland Kitchen buPROPion (WELLBUTRIN XL) 150 MG 24 hr tablet Take 150 mg by mouth daily.    . Vitamin D, Ergocalciferol, (DRISDOL) 50000 units CAPS capsule Take 1 capsule (50,000 Units total) by mouth every 7 (seven) days. 12 capsule 0   Facility-Administered Medications Prior to Visit  Medication Dose Route Frequency Provider Last Rate Last Dose  . 0.9 %  sodium chloride infusion  500 mL Intravenous Once Nandigam, Kavitha V, MD         EXAM:  BP 118/62 (BP Location: Right Arm, Patient Position: Sitting, Cuff Size: Normal)   Pulse 75   Temp 97.9 F (36.6 C) (Oral)   Ht 5' 5.5" (1.664 m)   Wt 119 lb 3.2 oz (54.1 kg)   SpO2 99%   BMI 19.53 kg/m   Body mass index is 19.53 kg/m. Wt Readings from Last 3 Encounters:  11/08/18 119 lb 3.2 oz (54.1 kg)  01/03/18 114 lb (51.7 kg)  12/28/17 114 lb 12.8 oz (52.1 kg)    Physical Exam: Vital signs reviewed GLO:VFIE is a well-developed well-nourished alert cooperative    who appearsr stated age in no acute distress. Slender  HEENT: normocephalic atraumatic , Eyes: PERRL EOM's full, conjunctiva clearglasses , Nares: paten,t no deformity discharge or tenderness., Ears: no deformity EAC's clear TMs with normal landmarks. Mouth: clear OP, no lesions, edema.  Moist mucous membranes. Dentition in adequate repair. NECK: supple without masses, thyromegaly or bruits. CHEST/PULM:  Clear to auscultation and percussion  breath sounds equal no wheeze , rales or rhonchi. Decrease bs  In ap diameter   Some kyphosis   Breast: normal by inspection . No dimpling, discharge, masses, tenderness or discharge . CV: PMI is nondisplaced, S1 S2 no gallops, murmurs, rubs. Peripheral pulses are without delay.No JVD .  ABDOMEN: Bowel sounds normal nontender  No guard or rebound, no hepato splenomegal no CVA tenderness.   Extremtities:  No clubbing cyanosis or edema, no acute joint swelling or redness no focal atrophy right ankle mild swelling no redness  Skin excoriation right shin  NEURO:  Oriented x3, cranial nerves 3-12 appear to be intact, no obvious focal weakness,gait within normal limits no abnormal reflexes or asymmetrical SKIN: No acute rashes normal turgor, color, no bruising or petechiae. PSYCH: Oriented, good eye contact, no obvious depression anxiety, cognition and judgment appear normal. LN: no cervical axillary inguinal adenopathy  Lab Results  Component Value Date   WBC 6.0 11/04/2018   HGB 13.5 11/04/2018   HCT 39.3 11/04/2018   PLT 379.0 11/04/2018   GLUCOSE 65 (L) 11/04/2018   CHOL 188 11/04/2018   TRIG 94.0 11/04/2018   HDL 59.50 11/04/2018   LDLCALC 110 (H) 11/04/2018   ALT 12  11/04/2018   AST 18 11/04/2018   NA 135 11/04/2018   K 4.9 11/04/2018   CL 99 11/04/2018   CREATININE 0.68 11/04/2018   BUN 15 11/04/2018   CO2 29 11/04/2018   TSH 1.59 11/04/2018   HGBA1C 5.8 11/06/2017    BP Readings from Last 3 Encounters:  11/08/18 118/62  01/03/18 (!) 147/89  11/06/17 122/74    Lab results reviewed with patient   Low vt d and mildelev alk phos   ASSESSMENT AND PLAN:  Discussed the following assessment and plan:  Visit for preventive health examination  Medication management  Vitamin D deficiency - Plan: VITAMIN D 25 Hydroxy (Vit-D Deficiency, Fractures), Hepatic function panel, Gamma GT  Essential hypertension  Sleep disorder, circadian, shift work type  Elevated alkaline  phosphatase level - Plan: VITAMIN D 25 Hydroxy (Vit-D Deficiency, Fractures), Hepatic function panel, Gamma GT  Cough, persistent - Plan: DG Chest 2 View  Tobacco user - Plan: DG Chest 2 View, Ambulatory Referral for Lung Cancer Scre  Need for tetanus booster - Plan: Td : Tetanus/diphtheria >7yo Preservative  free  Need for shingles vaccine - Plan: Varicella-zoster vaccine IM (Shingrix)  Encounter for screening for lung cancer - Plan: Ambulatory Referral for Lung Cancer Scre  Ankle pain, unspecified chronicity, unspecified laterality  Osteoporosis without current pathological fracture, unspecified osteoporosis type She  Clinically appears to have copd and pat is aware sees not need to do pfts  Knows she needs to stop tobacco .    Minor cough get x ray  Dis poss lung cancer sreening   Referral    Will proceed   For Shared Decision Making referral   Suggest endo consult regarding   osteoporosis she is  hesitant for meds but concern about a risk   consideration of such as re clast etc.  She will let nme know if wants to proceed  For now disc tobacco cessation vit d weight bearing exercise  Etc. she is hesitant to begin medication but is aware she needs to address the issue.  She is not diabetic  electronic record unfortunately is labeling her as diabetic incorrectly.  Therefore quality metrics are inaccurate. To get mammo and  Pap at her gyne this spring Patient Care Team: Burnis Medin, MD as PCP - General Monna Fam, MD as Consulting Physician (Ophthalmology) Chucky May, MD as Consulting Physician (Psychiatry) Patient Instructions   Will rx vit d   consider other intervention medications  for the osteoporosis    If needed you could consult with an endocrinology specialist for opinion .    consider consult lung cancer screening.  I will place  the referral. For consult and Shared Decision Making   Please stop smoking .   Plan fu lab tests vit d and lfts and alk phos etc   In about 3 months  ( not need to fast and no ov needed if doing ok )  Get your mammo and  Pap etc      Preventive Care 40-64 Years, Female Preventive care refers to lifestyle choices and visits with your health care provider that can promote health and wellness. What does preventive care include?   A yearly physical exam. This is also called an annual well check.  Dental exams once or twice a year.  Routine eye exams. Ask your health care provider how often you should have your eyes checked.  Personal lifestyle choices, including: ? Daily care of your teeth and gums. ? Regular physical  activity. ? Eating a healthy diet. ? Avoiding tobacco and drug use. ? Limiting alcohol use. ? Practicing safe sex. ? Taking low-dose aspirin daily starting at age 72. ? Taking vitamin and mineral supplements as recommended by your health care provider. What happens during an annual well check? The services and screenings done by your health care provider during your annual well check will depend on your age, overall health, lifestyle risk factors, and family history of disease. Counseling Your health care provider may ask you questions about your:  Alcohol use.  Tobacco use.  Drug use.  Emotional well-being.  Home and relationship well-being.  Sexual activity.  Eating habits.  Work and work Statistician.  Method of birth control.  Menstrual cycle.  Pregnancy history. Screening You may have the following tests or measurements:  Height, weight, and BMI.  Blood pressure.  Lipid and cholesterol levels. These may be checked every 5 years, or more frequently if you are over 55 years old.  Skin check.  Lung cancer screening. You may have this screening every year starting at age 17 if you have a 30-pack-year history of smoking and currently smoke or have quit within the past 15 years.  Colorectal cancer screening. All adults should have this screening starting at age 76 and  continuing until age 7. Your health care provider may recommend screening at age 26. You will have tests every 1-10 years, depending on your results and the type of screening test. People at increased risk should start screening at an earlier age. Screening tests may include: ? Guaiac-based fecal occult blood testing. ? Fecal immunochemical test (FIT). ? Stool DNA test. ? Virtual colonoscopy. ? Sigmoidoscopy. During this test, a flexible tube with a tiny camera (sigmoidoscope) is used to examine your rectum and lower colon. The sigmoidoscope is inserted through your anus into your rectum and lower colon. ? Colonoscopy. During this test, a long, thin, flexible tube with a tiny camera (colonoscope) is used to examine your entire colon and rectum.  Hepatitis C blood test.  Hepatitis B blood test.  Sexually transmitted disease (STD) testing.  Diabetes screening. This is done by checking your blood sugar (glucose) after you have not eaten for a while (fasting). You may have this done every 1-3 years.  Mammogram. This may be done every 1-2 years. Talk to your health care provider about when you should start having regular mammograms. This may depend on whether you have a family history of breast cancer.  BRCA-related cancer screening. This may be done if you have a family history of breast, ovarian, tubal, or peritoneal cancers.  Pelvic exam and Pap test. This may be done every 3 years starting at age 58. Starting at age 61, this may be done every 5 years if you have a Pap test in combination with an HPV test.  Bone density scan. This is done to screen for osteoporosis. You may have this scan if you are at high risk for osteoporosis. Discuss your test results, treatment options, and if necessary, the need for more tests with your health care provider. Vaccines Your health care provider may recommend certain vaccines, such as:  Influenza vaccine. This is recommended every year.  Tetanus,  diphtheria, and acellular pertussis (Tdap, Td) vaccine. You may need a Td booster every 10 years.  Varicella vaccine. You may need this if you have not been vaccinated.  Zoster vaccine. You may need this after age 58.  Measles, mumps, and rubella (MMR) vaccine. You may need  at least one dose of MMR if you were born in 1957 or later. You may also need a second dose.  Pneumococcal 13-valent conjugate (PCV13) vaccine. You may need this if you have certain conditions and were not previously vaccinated.  Pneumococcal polysaccharide (PPSV23) vaccine. You may need one or two doses if you smoke cigarettes or if you have certain conditions.  Meningococcal vaccine. You may need this if you have certain conditions.  Hepatitis A vaccine. You may need this if you have certain conditions or if you travel or work in places where you may be exposed to hepatitis A.  Hepatitis B vaccine. You may need this if you have certain conditions or if you travel or work in places where you may be exposed to hepatitis B.  Haemophilus influenzae type b (Hib) vaccine. You may need this if you have certain conditions. Talk to your health care provider about which screenings and vaccines you need and how often you need them. This information is not intended to replace advice given to you by your health care provider. Make sure you discuss any questions you have with your health care provider. Document Released: 10/08/2015 Document Revised: 11/01/2017 Document Reviewed: 07/13/2015 Elsevier Interactive Patient Education  2019 [place] protect organs, store calcium, anchor muscles, and support the whole body. Keeping your bones strong is important, especially as you get older. You can take actions to help keep your bones strong and healthy. Why is keeping my bones healthy important?  Keeping your bones healthy is important because your body constantly replaces bone cells. Cells get old, and new  cells take their place. As we age, we lose bone cells because the body may not be able to make enough new cells to replace the old cells. The amount of bone cells and bone tissue you have is referred to as bone mass. The higher your bone mass, the stronger your bones. The aging process leads to an overall loss of bone mass in the body, which can increase the likelihood of:  Joint pain and stiffness.  Broken bones.  A condition in which the bones become weak and brittle (osteoporosis). A large decline in bone mass occurs in older adults. In women, it occurs about the time of menopause. What actions can I take to keep my bones healthy? Good health habits are important for maintaining healthy bones. This includes eating nutritious foods and exercising regularly. To have healthy bones, you need to get enough of the right minerals and vitamins. Most nutrition experts recommend getting these nutrients from the foods that you eat. In some cases, taking supplements may also be recommended. Doing certain types of exercise is also important for bone health. What are the nutritional recommendations for healthy bones?  Eating a well-balanced diet with plenty of calcium and vitamin D will help to protect your bones. Nutritional recommendations vary from person to person. Ask your health care provider what is healthy for you. Here are some general guidelines. Get enough calcium Calcium is the most important (essential) mineral for bone health. Most people can get enough calcium from their diet, but supplements may be recommended for people who are at risk for osteoporosis. Good sources of calcium include:  Dairy products, such as low-fat or nonfat milk, cheese, and yogurt.  Dark green leafy vegetables, such as bok choy and broccoli.  Calcium-fortified foods, such as orange juice, cereal, bread, soy beverages, and tofu products.  Nuts, such as almonds. Follow these recommended  amounts for daily calcium  intake:  Children, age 149-3: 700 mg.  Children, age 63-8: 1,000 mg.  Children, age 56-13: 1,300 mg.  Teens, age 639-18: 1,300 mg.  Adults, age 638-50: 1,000 mg.  Adults, age 64-70: ? Men: 1,000 mg. ? Women: 1,200 mg.  Adults, age 639 or older: 1,200 mg.  Pregnant and breastfeeding females: ? Teens: 1,300 mg. ? Adults: 1,000 mg. Get enough vitamin D Vitamin D is the most essential vitamin for bone health. It helps the body absorb calcium. Sunlight stimulates the skin to make vitamin D, so be sure to get enough sunlight. If you live in a cold climate or you do not get outside often, your health care provider may recommend that you take vitamin D supplements. Good sources of vitamin D in your diet include:  Egg yolks.  Saltwater fish.  Milk and cereal fortified with vitamin D. Follow these recommended amounts for daily vitamin D intake:  Children and teens, age 149-18: 600 international units.  Adults, age 2 or younger: 400-800 international units.  Adults, age 38 or older: 800-1,000 international units. Get other important nutrients Other nutrients that are important for bone health include:  Phosphorus. This mineral is found in meat, poultry, dairy foods, nuts, and legumes. The recommended daily intake for adult men and adult women is 700 mg.  Magnesium. This mineral is found in seeds, nuts, dark green vegetables, and legumes. The recommended daily intake for adult men is 400-420 mg. For adult women, it is 310-320 mg.  Vitamin K. This vitamin is found in green leafy vegetables. The recommended daily intake is 120 mg for adult men and 90 mg for adult women. What type of physical activity is best for building and maintaining healthy bones? Weight-bearing and strength-building activities are important for building and maintaining healthy bones. Weight-bearing activities cause muscles and bones to work against gravity. Strength-building activities increase the strength of the muscles  that support bones. Weight-bearing and muscle-building activities include:  Walking and hiking.  Jogging and running.  Dancing.  Gym exercises.  Lifting weights.  Tennis and racquetball.  Climbing stairs.  Aerobics. Adults should get at least 30 minutes of moderate physical activity on most days. Children should get at least 60 minutes of moderate physical activity on most days. Ask your health care provider what type of exercise is best for you. How can I find out if my bone mass is low? Bone mass can be measured with an X-ray test called a bone mineral density (BMD) test. This test is recommended for all women who are age 50 or older. It may also be recommended for:  Men who are age 63 or older.  People who are at risk for osteoporosis because of: ? Having bones that break easily. ? Having a long-term disease that weakens bones, such as kidney disease or rheumatoid arthritis. ? Having menopause earlier than normal. ? Taking medicine that weakens bones, such as steroids, thyroid hormones, or hormone treatment for breast cancer or prostate cancer. ? Smoking. ? Drinking three or more alcoholic drinks a day. If you find that you have a low bone mass, you may be able to prevent osteoporosis or further bone loss by changing your diet and lifestyle. Where can I find more information? For more information, check out the following websites:  [place]: AviationTales.fr  [employer] of Health: www.bones.SouthExposed.es  International Osteoporosis Foundation: Administrator.iofbonehealth.org Summary  The aging process leads to an overall loss of bone mass in the body,  which can increase the likelihood of broken bones and osteoporosis.  Eating a well-balanced diet with plenty of calcium and vitamin D will help to protect your bones.  Weight-bearing and strength-building activities are also important for building and maintaining strong bones.  Bone mass can be  measured with an X-ray test called a bone mineral density (BMD) test. This information is not intended to replace advice given to you by your health care provider. Make sure you discuss any questions you have with your health care provider. Document Released: 12/02/2003 Document Revised: 10/08/2017 Document Reviewed: 10/08/2017 Elsevier Interactive Patient Education  2019 [place]. Haruko Mersch M.D.

## 2018-11-07 MED FILL — CHLORDIAZEPOXIDE 5 MG CAP: 5 | 90 days supply | Qty: 270 | Fill #0

## 2018-11-07 MED FILL — ARMODAFINIL 250 MG TABLET: 250 | 90 days supply | Qty: 90 | Fill #1

## 2018-11-08 ENCOUNTER — Ambulatory Visit (INDEPENDENT_AMBULATORY_CARE_PROVIDER_SITE_OTHER): Payer: No Typology Code available for payment source

## 2018-11-08 ENCOUNTER — Ambulatory Visit (INDEPENDENT_AMBULATORY_CARE_PROVIDER_SITE_OTHER): Payer: No Typology Code available for payment source | Admitting: Internal Medicine

## 2018-11-08 ENCOUNTER — Encounter: Payer: Self-pay | Admitting: Internal Medicine

## 2018-11-08 VITALS — BP 118/62 | HR 75 | Temp 97.9°F | Ht 65.5 in | Wt 119.2 lb

## 2018-11-08 DIAGNOSIS — G4726 Circadian rhythm sleep disorder, shift work type: Secondary | ICD-10-CM

## 2018-11-08 DIAGNOSIS — Z79899 Other long term (current) drug therapy: Secondary | ICD-10-CM | POA: Diagnosis not present

## 2018-11-08 DIAGNOSIS — R05 Cough: Secondary | ICD-10-CM

## 2018-11-08 DIAGNOSIS — I10 Essential (primary) hypertension: Secondary | ICD-10-CM

## 2018-11-08 DIAGNOSIS — Z720 Tobacco use: Secondary | ICD-10-CM

## 2018-11-08 DIAGNOSIS — R748 Abnormal levels of other serum enzymes: Secondary | ICD-10-CM

## 2018-11-08 DIAGNOSIS — E559 Vitamin D deficiency, unspecified: Secondary | ICD-10-CM | POA: Diagnosis not present

## 2018-11-08 DIAGNOSIS — M810 Age-related osteoporosis without current pathological fracture: Secondary | ICD-10-CM

## 2018-11-08 DIAGNOSIS — R053 Chronic cough: Secondary | ICD-10-CM

## 2018-11-08 DIAGNOSIS — Z23 Encounter for immunization: Secondary | ICD-10-CM

## 2018-11-08 DIAGNOSIS — Z0000 Encounter for general adult medical examination without abnormal findings: Secondary | ICD-10-CM | POA: Diagnosis not present

## 2018-11-08 DIAGNOSIS — Z122 Encounter for screening for malignant neoplasm of respiratory organs: Secondary | ICD-10-CM

## 2018-11-08 DIAGNOSIS — M25579 Pain in unspecified ankle and joints of unspecified foot: Secondary | ICD-10-CM

## 2018-11-08 MED ORDER — VITAMIN D (ERGOCALCIFEROL) 1.25 MG (50000 UNIT) PO CAPS: 50000.0000 [IU] | ORAL_CAPSULE | ORAL | 0 refills | Status: DC

## 2018-11-08 MED FILL — VIT D2 1.25 MG (50,000 UNIT: 1.25 MG | 84 days supply | Qty: 12 | Fill #0

## 2018-11-08 NOTE — Patient Instructions (Addendum)
Will rx vit d   consider other intervention medications  for the osteoporosis    If needed you could consult with an endocrinology specialist for opinion .    consider consult lung cancer screening.  I will place  the referral. For consult and Shared Decision Making   Please stop smoking .   Plan fu lab tests vit d and lfts and alk phos etc  In about 3 months  ( not need to fast and no ov needed if doing ok )  Get your mammo and  Pap etc      Preventive Care 40-64 Years, Female Preventive care refers to lifestyle choices and visits with your health care provider that can promote health and wellness. What does preventive care include?   A yearly physical exam. This is also called an annual well check.  Dental exams once or twice a year.  Routine eye exams. Ask your health care provider how often you should have your eyes checked.  Personal lifestyle choices, including: ? Daily care of your teeth and gums. ? Regular physical activity. ? Eating a healthy diet. ? Avoiding tobacco and drug use. ? Limiting alcohol use. ? Practicing safe sex. ? Taking low-dose aspirin daily starting at age 49. ? Taking vitamin and mineral supplements as recommended by your health care provider. What happens during an annual well check? The services and screenings done by your health care provider during your annual well check will depend on your age, overall health, lifestyle risk factors, and family history of disease. Counseling Your health care provider may ask you questions about your:  Alcohol use.  Tobacco use.  Drug use.  Emotional well-being.  Home and relationship well-being.  Sexual activity.  Eating habits.  Work and work Statistician.  Method of birth control.  Menstrual cycle.  Pregnancy history. Screening You may have the following tests or measurements:  Height, weight, and BMI.  Blood pressure.  Lipid and cholesterol levels. These may be checked every 5  years, or more frequently if you are over 10 years old.  Skin check.  Lung cancer screening. You may have this screening every year starting at age 49 if you have a 30-pack-year history of smoking and currently smoke or have quit within the past 15 years.  Colorectal cancer screening. All adults should have this screening starting at age 50 and continuing until age 63. Your health care provider may recommend screening at age 22. You will have tests every 1-10 years, depending on your results and the type of screening test. People at increased risk should start screening at an earlier age. Screening tests may include: ? Guaiac-based fecal occult blood testing. ? Fecal immunochemical test (FIT). ? Stool DNA test. ? Virtual colonoscopy. ? Sigmoidoscopy. During this test, a flexible tube with a tiny camera (sigmoidoscope) is used to examine your rectum and lower colon. The sigmoidoscope is inserted through your anus into your rectum and lower colon. ? Colonoscopy. During this test, a long, thin, flexible tube with a tiny camera (colonoscope) is used to examine your entire colon and rectum.  Hepatitis C blood test.  Hepatitis B blood test.  Sexually transmitted disease (STD) testing.  Diabetes screening. This is done by checking your blood sugar (glucose) after you have not eaten for a while (fasting). You may have this done every 1-3 years.  Mammogram. This may be done every 1-2 years. Talk to your health care provider about when you should start having regular mammograms. This may  depend on whether you have a family history of breast cancer.  BRCA-related cancer screening. This may be done if you have a family history of breast, ovarian, tubal, or peritoneal cancers.  Pelvic exam and Pap test. This may be done every 3 years starting at age 29. Starting at age 42, this may be done every 5 years if you have a Pap test in combination with an HPV test.  Bone density scan. This is done to screen  for osteoporosis. You may have this scan if you are at high risk for osteoporosis. Discuss your test results, treatment options, and if necessary, the need for more tests with your health care provider. Vaccines Your health care provider may recommend certain vaccines, such as:  Influenza vaccine. This is recommended every year.  Tetanus, diphtheria, and acellular pertussis (Tdap, Td) vaccine. You may need a Td booster every 10 years.  Varicella vaccine. You may need this if you have not been vaccinated.  Zoster vaccine. You may need this after age 33.  Measles, mumps, and rubella (MMR) vaccine. You may need at least one dose of MMR if you were born in 1957 or later. You may also need a second dose.  Pneumococcal 13-valent conjugate (PCV13) vaccine. You may need this if you have certain conditions and were not previously vaccinated.  Pneumococcal polysaccharide (PPSV23) vaccine. You may need one or two doses if you smoke cigarettes or if you have certain conditions.  Meningococcal vaccine. You may need this if you have certain conditions.  Hepatitis A vaccine. You may need this if you have certain conditions or if you travel or work in places where you may be exposed to hepatitis A.  Hepatitis B vaccine. You may need this if you have certain conditions or if you travel or work in places where you may be exposed to hepatitis B.  Haemophilus influenzae type b (Hib) vaccine. You may need this if you have certain conditions. Talk to your health care provider about which screenings and vaccines you need and how often you need them. This information is not intended to replace advice given to you by your health care provider. Make sure you discuss any questions you have with your health care provider. Document Released: 10/08/2015 Document Revised: 11/01/2017 Document Reviewed: 07/13/2015 Elsevier Interactive Patient Education  2019 [place] protect organs, store  calcium, anchor muscles, and support the whole body. Keeping your bones strong is important, especially as you get older. You can take actions to help keep your bones strong and healthy. Why is keeping my bones healthy important?  Keeping your bones healthy is important because your body constantly replaces bone cells. Cells get old, and new cells take their place. As we age, we lose bone cells because the body may not be able to make enough new cells to replace the old cells. The amount of bone cells and bone tissue you have is referred to as bone mass. The higher your bone mass, the stronger your bones. The aging process leads to an overall loss of bone mass in the body, which can increase the likelihood of:  Joint pain and stiffness.  Broken bones.  A condition in which the bones become weak and brittle (osteoporosis). A large decline in bone mass occurs in older adults. In women, it occurs about the time of menopause. What actions can I take to keep my bones healthy? Good health habits are important for maintaining healthy bones. This includes eating  nutritious foods and exercising regularly. To have healthy bones, you need to get enough of the right minerals and vitamins. Most nutrition experts recommend getting these nutrients from the foods that you eat. In some cases, taking supplements may also be recommended. Doing certain types of exercise is also important for bone health. What are the nutritional recommendations for healthy bones?  Eating a well-balanced diet with plenty of calcium and vitamin D will help to protect your bones. Nutritional recommendations vary from person to person. Ask your health care provider what is healthy for you. Here are some general guidelines. Get enough calcium Calcium is the most important (essential) mineral for bone health. Most people can get enough calcium from their diet, but supplements may be recommended for people who are at risk for osteoporosis.  Good sources of calcium include:  Dairy products, such as low-fat or nonfat milk, cheese, and yogurt.  Dark green leafy vegetables, such as bok choy and broccoli.  Calcium-fortified foods, such as orange juice, cereal, bread, soy beverages, and tofu products.  Nuts, such as almonds. Follow these recommended amounts for daily calcium intake:  Children, age 748-3: 700 mg.  Children, age 74-8: 1,000 mg.  Children, age 28-13: 1,300 mg.  Teens, age 79-18: 1,300 mg.  Adults, age 59-50: 1,000 mg.  Adults, age 38-70: ? Men: 1,000 mg. ? Women: 1,200 mg.  Adults, age 64 or older: 1,200 mg.  Pregnant and breastfeeding females: ? Teens: 1,300 mg. ? Adults: 1,000 mg. Get enough vitamin D Vitamin D is the most essential vitamin for bone health. It helps the body absorb calcium. Sunlight stimulates the skin to make vitamin D, so be sure to get enough sunlight. If you live in a cold climate or you do not get outside often, your health care provider may recommend that you take vitamin D supplements. Good sources of vitamin D in your diet include:  Egg yolks.  Saltwater fish.  Milk and cereal fortified with vitamin D. Follow these recommended amounts for daily vitamin D intake:  Children and teens, age 748-18: 600 international units.  Adults, age 57 or younger: 400-800 international units.  Adults, age 21 or older: 800-1,000 international units. Get other important nutrients Other nutrients that are important for bone health include:  Phosphorus. This mineral is found in meat, poultry, dairy foods, nuts, and legumes. The recommended daily intake for adult men and adult women is 700 mg.  Magnesium. This mineral is found in seeds, nuts, dark green vegetables, and legumes. The recommended daily intake for adult men is 400-420 mg. For adult women, it is 310-320 mg.  Vitamin K. This vitamin is found in green leafy vegetables. The recommended daily intake is 120 mg for adult men and 90 mg for  adult women. What type of physical activity is best for building and maintaining healthy bones? Weight-bearing and strength-building activities are important for building and maintaining healthy bones. Weight-bearing activities cause muscles and bones to work against gravity. Strength-building activities increase the strength of the muscles that support bones. Weight-bearing and muscle-building activities include:  Walking and hiking.  Jogging and running.  Dancing.  Gym exercises.  Lifting weights.  Tennis and racquetball.  Climbing stairs.  Aerobics. Adults should get at least 30 minutes of moderate physical activity on most days. Children should get at least 60 minutes of moderate physical activity on most days. Ask your health care provider what type of exercise is best for you. How can I find out if my bone mass  is low? Bone mass can be measured with an X-ray test called a bone mineral density (BMD) test. This test is recommended for all women who are age 63 or older. It may also be recommended for:  Men who are age 60 or older.  People who are at risk for osteoporosis because of: ? Having bones that break easily. ? Having a long-term disease that weakens bones, such as kidney disease or rheumatoid arthritis. ? Having menopause earlier than normal. ? Taking medicine that weakens bones, such as steroids, thyroid hormones, or hormone treatment for breast cancer or prostate cancer. ? Smoking. ? Drinking three or more alcoholic drinks a day. If you find that you have a low bone mass, you may be able to prevent osteoporosis or further bone loss by changing your diet and lifestyle. Where can I find more information? For more information, check out the following websites:  [place]: [employer]fr  [employer] of Health: www.bones.SouthExposed.es  International Osteoporosis Foundation: Administrator.iofbonehealth.org Summary  The aging process leads to an  overall loss of bone mass in the body, which can increase the likelihood of broken bones and osteoporosis.  Eating a well-balanced diet with plenty of calcium and vitamin D will help to protect your bones.  Weight-bearing and strength-building activities are also important for building and maintaining strong bones.  Bone mass can be measured with an X-ray test called a bone mineral density (BMD) test. This information is not intended to replace advice given to you by your health care provider. Make sure you discuss any questions you have with your health care provider. Document Released: 12/02/2003 Document Revised: 10/08/2017 Document Reviewed: 10/08/2017 Elsevier Interactive Patient Education  2019 [employer].

## 2018-11-14 ENCOUNTER — Ambulatory Visit (INDEPENDENT_AMBULATORY_CARE_PROVIDER_SITE_OTHER): Payer: No Typology Code available for payment source

## 2018-11-14 ENCOUNTER — Ambulatory Visit: Payer: No Typology Code available for payment source | Admitting: Podiatry

## 2018-11-14 ENCOUNTER — Other Ambulatory Visit: Payer: Self-pay | Admitting: Podiatry

## 2018-11-14 VITALS — BP 156/94 | HR 75

## 2018-11-14 DIAGNOSIS — M722 Plantar fascial fibromatosis: Secondary | ICD-10-CM

## 2018-11-14 DIAGNOSIS — M79672 Pain in left foot: Secondary | ICD-10-CM

## 2018-11-14 DIAGNOSIS — M79671 Pain in right foot: Secondary | ICD-10-CM

## 2018-11-14 DIAGNOSIS — M7661 Achilles tendinitis, right leg: Secondary | ICD-10-CM | POA: Diagnosis not present

## 2018-11-14 DIAGNOSIS — M7662 Achilles tendinitis, left leg: Secondary | ICD-10-CM

## 2018-11-14 NOTE — Patient Instructions (Addendum)
For instructions on how to put on your Night Splint, please visit [email or web address]   Plantar Fasciitis (Heel Spur Syndrome) with Rehab The plantar fascia is a fibrous, ligament-like, soft-tissue structure that spans the bottom of the foot. Plantar fasciitis is a condition that causes pain in the foot due to inflammation of the tissue. SYMPTOMS   Pain and tenderness on the underneath side of the foot.  Pain that worsens with standing or walking. CAUSES  Plantar fasciitis is caused by irritation and injury to the plantar fascia on the underneath side of the foot. Common mechanisms of injury include:  Direct trauma to bottom of the foot.  Damage to a small nerve that runs under the foot where the main fascia attaches to the heel bone.  Stress placed on the plantar fascia due to bone spurs. RISK INCREASES WITH:   Activities that place stress on the plantar fascia (running, jumping, pivoting, or cutting).  Poor strength and flexibility.  Improperly fitted shoes.  Tight calf muscles.  Flat feet.  Failure to warm-up properly before activity.  Obesity. PREVENTION  Warm up and stretch properly before activity.  Allow for adequate recovery between workouts.  Maintain physical fitness:  Strength, flexibility, and endurance.  Cardiovascular fitness.  Maintain a health body weight.  Avoid stress on the plantar fascia.  Wear properly fitted shoes, including arch supports for individuals who have flat feet.  PROGNOSIS  If treated properly, then the symptoms of plantar fasciitis usually resolve without surgery. However, occasionally surgery is necessary.  RELATED COMPLICATIONS   Recurrent symptoms that may result in a chronic condition.  Problems of the lower back that are caused by compensating for the injury, such as limping.  Pain or weakness of the foot during push-off following surgery.  Chronic inflammation, scarring, and partial or complete fascia tear,  occurring more often from repeated injections.  TREATMENT  Treatment initially involves the use of ice and medication to help reduce pain and inflammation. The use of strengthening and stretching exercises may help reduce pain with activity, especially stretches of the Achilles tendon. These exercises may be performed at home or with a therapist. Your caregiver may recommend that you use heel cups of arch supports to help reduce stress on the plantar fascia. Occasionally, corticosteroid injections are given to reduce inflammation. If symptoms persist for greater than 6 months despite non-surgical (conservative), then surgery may be recommended.   MEDICATION   If pain medication is necessary, then nonsteroidal anti-inflammatory medications, such as aspirin and ibuprofen, or other minor pain relievers, such as acetaminophen, are often recommended.  Do not take pain medication within 7 days before surgery.  Prescription pain relievers may be given if deemed necessary by your caregiver. Use only as directed and only as much as you need.  Corticosteroid injections may be given by your caregiver. These injections should be reserved for the most serious cases, because they may only be given a certain number of times.  HEAT AND COLD  Cold treatment (icing) relieves pain and reduces inflammation. Cold treatment should be applied for 10 to 15 minutes every 2 to 3 hours for inflammation and pain and immediately after any activity that aggravates your symptoms. Use ice packs or massage the area with a piece of ice (ice massage).  Heat treatment may be used prior to performing the stretching and strengthening activities prescribed by your caregiver, physical therapist, or athletic trainer. Use a heat pack or soak the injury in warm water.  SEEK IMMEDIATE MEDICAL CARE   IF:  Treatment seems to offer no benefit, or the condition worsens.  Any medications produce adverse side effects.  EXERCISES- RANGE OF  MOTION (ROM) AND STRETCHING EXERCISES - Plantar Fasciitis (Heel Spur Syndrome) These exercises may help you when beginning to rehabilitate your injury. Your symptoms may resolve with or without further involvement from your physician, physical therapist or athletic trainer. While completing these exercises, remember:   Restoring tissue flexibility helps normal motion to return to the joints. This allows healthier, less painful movement and activity.  An effective stretch should be held for at least 30 seconds.  A stretch should never be painful. You should only feel a gentle lengthening or release in the stretched tissue.  RANGE OF MOTION - Toe Extension, Flexion  Sit with your right / left leg crossed over your opposite knee.  Grasp your toes and gently pull them back toward the top of your foot. You should feel a stretch on the bottom of your toes and/or foot.  Hold this stretch for 10 seconds.  Now, gently pull your toes toward the bottom of your foot. You should feel a stretch on the top of your toes and or foot.  Hold this stretch for 10 seconds. Repeat  times. Complete this stretch 3 times per day.   RANGE OF MOTION - Ankle Dorsiflexion, Active Assisted  Remove shoes and sit on a chair that is preferably not on a carpeted surface.  Place right / left foot under knee. Extend your opposite leg for support.  Keeping your heel down, slide your right / left foot back toward the chair until you feel a stretch at your ankle or calf. If you do not feel a stretch, slide your bottom forward to the edge of the chair, while still keeping your heel down.  Hold this stretch for 10 seconds. Repeat 3 times. Complete this stretch 2 times per day.   STRETCH  Gastroc, Standing  Place hands on wall.  Extend right / left leg, keeping the front knee somewhat bent.  Slightly point your toes inward on your back foot.  Keeping your right / left heel on the floor and your knee straight, shift  your weight toward the wall, not allowing your back to arch.  You should feel a gentle stretch in the right / left calf. Hold this position for 10 seconds. Repeat 3 times. Complete this stretch 2 times per day.  STRETCH  Soleus, Standing  Place hands on wall.  Extend right / left leg, keeping the other knee somewhat bent.  Slightly point your toes inward on your back foot.  Keep your right / left heel on the floor, bend your back knee, and slightly shift your weight over the back leg so that you feel a gentle stretch deep in your back calf.  Hold this position for 10 seconds. Repeat 3 times. Complete this stretch 2 times per day.  STRETCH  Gastrocsoleus, Standing  Note: This exercise can place a lot of stress on your foot and ankle. Please complete this exercise only if specifically instructed by your caregiver.   Place the ball of your right / left foot on a step, keeping your other foot firmly on the same step.  Hold on to the wall or a rail for balance.  Slowly lift your other foot, allowing your body weight to press your heel down over the edge of the step.  You should feel a stretch in your right / left calf.  Hold this position   for 10 seconds.  Repeat this exercise with a slight bend in your right / left knee. Repeat 3 times. Complete this stretch 2 times per day.   STRENGTHENING EXERCISES - Plantar Fasciitis (Heel Spur Syndrome)  These exercises may help you when beginning to rehabilitate your injury. They may resolve your symptoms with or without further involvement from your physician, physical therapist or athletic trainer. While completing these exercises, remember:   Muscles can gain both the endurance and the strength needed for everyday activities through controlled exercises.  Complete these exercises as instructed by your physician, physical therapist or athletic trainer. Progress the resistance and repetitions only as guided.  STRENGTH - Towel Curls  Sit in  a chair positioned on a non-carpeted surface.  Place your foot on a towel, keeping your heel on the floor.  Pull the towel toward your heel by only curling your toes. Keep your heel on the floor. Repeat 3 times. Complete this exercise 2 times per day.  STRENGTH - Ankle Inversion  Secure one end of a rubber exercise band/tubing to a fixed object (table, pole). Loop the other end around your foot just before your toes.  Place your fists between your knees. This will focus your strengthening at your ankle.  Slowly, pull your big toe up and in, making sure the band/tubing is positioned to resist the entire motion.  Hold this position for 10 seconds.  Have your muscles resist the band/tubing as it slowly pulls your foot back to the starting position. Repeat 3 times. Complete this exercises 2 times per day.  Document Released: 09/11/2005 Document Revised: 12/04/2011 Document Reviewed: 12/24/2008 [employer] Patient Information 2014 [employer].  

## 2018-11-18 ENCOUNTER — Other Ambulatory Visit: Payer: Self-pay | Admitting: Acute Care

## 2018-11-18 DIAGNOSIS — F17210 Nicotine dependence, cigarettes, uncomplicated: Principal | ICD-10-CM

## 2018-11-18 DIAGNOSIS — Z122 Encounter for screening for malignant neoplasm of respiratory organs: Secondary | ICD-10-CM

## 2018-11-19 ENCOUNTER — Telehealth: Payer: Self-pay | Admitting: Acute Care

## 2018-11-20 ENCOUNTER — Inpatient Hospital Stay: Admission: RE | Admit: 2018-11-20 | Payer: Self-pay | Source: Ambulatory Visit

## 2018-11-20 ENCOUNTER — Encounter: Payer: Self-pay | Admitting: Acute Care

## 2018-11-20 NOTE — Telephone Encounter (Signed)
Patient returned call.  CB is [phone]. She states she probably will just skip having the lung cancer screening done and does not require a call back.

## 2018-11-20 NOTE — Telephone Encounter (Signed)
LMTC x 1  

## 2018-11-23 NOTE — Progress Notes (Signed)
Subjective:  Patient ID: Jasmine Nelson, female    DOB: 05/29/1956,  MRN: [id number]  Chief Complaint  Patient presents with  . Bunions    Pt states bunions cause her shoes to fit odd.  . Foot Problem    Swelling in right medial ankle/pain in medial ankle  . Foot Problem    Pt states they believe they have plantar fasciitis due to bilateral plantar heel foot pain   63 y.o. female presents with the above complaint.   Review of Systems: Negative except as noted in the HPI. Denies N/V/F/Ch.  Past Medical History:  Diagnosis Date  . Allergy   . Anxiety   . Cataract    both eyes  . Chicken pox   . COPD (chronic obstructive pulmonary disease) ([place])   . Depression   . Glaucoma   . Hamstring injury    torn  . Hx of thyroid disease    3rd grade  resolved   . Hypertension   . Ingestion of substance    3 04  . Osteoporosis   . Shift work sleep disorder     Current Outpatient Medications:  .  Armodafinil (NUVIGIL) 150 MG tablet, Take 150 mg by mouth daily., Disp: , Rfl:  .  azithromycin (ZITHROMAX) 250 MG tablet, , Disp: , Rfl: 0 .  chlordiazePOXIDE (LIBRIUM) 5 MG capsule, Take 5 mg by mouth 2 (two) times daily. , Disp: , Rfl: 0 .  Cholecalciferol (VITAMIN D PO), Take by mouth., Disp: , Rfl:  .  gabapentin (NEURONTIN) 300 MG capsule, Take 300 mg by mouth 3 (three) times daily. , Disp: , Rfl:  .  hydrochlorothiazide (HYDRODIURIL) 12.5 MG tablet, , Disp: , Rfl:  .  losartan (COZAAR) 100 MG tablet, , Disp: , Rfl:  .  losartan-hydrochlorothiazide (HYZAAR) 100-12.5 MG tablet, TAKE 1 TABLET BY MOUTH DAILY., Disp: 90 tablet, Rfl: 0 .  RESTASIS 0.05 % ophthalmic emulsion, , Disp: , Rfl:  .  Vitamin D, Ergocalciferol, (DRISDOL) 1.25 MG (50000 UT) CAPS capsule, Take 1 capsule (50,000 Units total) by mouth every 7 (seven) days., Disp: 12 capsule, Rfl: 0 .  vortioxetine HBr (TRINTELLIX) 20 MG TABS, Take 20 mg by mouth daily., Disp: , Rfl:  .  zolpidem (AMBIEN) 10 MG tablet, Take 10 mg by  mouth at bedtime as needed for sleep., Disp: , Rfl:   Current Facility-Administered Medications:  .  0.9 %  sodium chloride infusion, 500 mL, Intravenous, Once, Nandigam, Venia Minks, MD  Social History   Tobacco Use  Smoking Status Current Every Day Smoker  . Packs/day: 1.00  . Types: Cigarettes  Smokeless Tobacco Never Used    Allergies  Allergen Reactions  . Lisinopril Cough    Mild cough after a month    Objective:   Vitals:   11/14/18 0925  BP: (!) 156/94  Pulse: 75   There is no height or weight on file to calculate BMI. Constitutional Well developed. Well nourished.  Vascular Dorsalis pedis pulses palpable bilaterally. Posterior tibial pulses palpable bilaterally. Capillary refill normal to all digits.  No cyanosis or clubbing noted. Pedal hair growth normal.  Neurologic Normal speech. Oriented to person, place, and time. Epicritic sensation to light touch grossly present bilaterally.  Dermatologic Nails well groomed and normal in appearance. No open wounds. No skin lesions.  Orthopedic: Normal joint ROM without pain or crepitus bilaterally. No visible deformities. Tender to palpation at the calcaneal tuber bilaterally. No pain with calcaneal squeeze bilaterally. Ankle ROM diminished  range of motion bilaterally. Silfverskiold Test: positive bilaterally.   Radiographs: Taken and reviewed. No acute fractures or dislocations. No evidence of stress fracture.  Plantar heel spur absent. Posterior heel spur absent.   Assessment:   1. Plantar fasciitis   2. Tendonitis, Achilles, right   3. Tendonitis, Achilles, left    Plan:  Patient was evaluated and treated and all questions answered.  Achilles tendonitis/ plantar Fasciitis, bilaterally - XR reviewed as above.  - Educated on icing and stretching. Instructions given.  - DME: Night splint dispensed.  Return in about 4 weeks (around 12/12/2018) for Tendonitis, Plantar fasciitis, Right.

## 2018-11-25 NOTE — Telephone Encounter (Signed)
Noted. Referral cancelled.  Note sent to referring provider to let them know referral has been cancelled.

## 2018-11-29 MED FILL — ZOLPIDEM TARTRATE 10 MG TAB: 10 | 90 days supply | Qty: 90 | Fill #0

## 2019-01-07 ENCOUNTER — Other Ambulatory Visit: Payer: Self-pay | Admitting: Internal Medicine

## 2019-01-07 MED FILL — FLUoxetine HCL 40 MG CAPS: 40 | 90 days supply | Qty: 90 | Fill #0

## 2019-01-07 MED FILL — LOSARTAN POTASSIUM 100 MG T: 100 | 30 days supply | Qty: 30 | Fill #0

## 2019-01-07 MED FILL — HYDROCHLOROTHIAZIDE 12.5 MG: 12.5 | 90 days supply | Qty: 90 | Fill #0

## 2019-01-09 ENCOUNTER — Ambulatory Visit: Payer: No Typology Code available for payment source | Admitting: Podiatry

## 2019-01-17 DIAGNOSIS — F3342 Major depressive disorder, recurrent, in full remission: Secondary | ICD-10-CM | POA: Diagnosis not present

## 2019-01-17 DIAGNOSIS — F411 Generalized anxiety disorder: Secondary | ICD-10-CM | POA: Diagnosis not present

## 2019-01-27 MED FILL — GABAPENTIN 300 MG CAPSULE: 300 | 90 days supply | Qty: 270 | Fill #1

## 2019-02-01 MED FILL — ARMODAFINIL 250 MG TABLET: 250 | 30 days supply | Qty: 30 | Fill #0

## 2019-02-06 MED FILL — LOSARTAN POTASSIUM 100 MG T: 100 | 30 days supply | Qty: 30 | Fill #1

## 2019-02-24 MED FILL — ZOLPIDEM TARTRATE 10 MG TAB: 10 | 90 days supply | Qty: 90 | Fill #0

## 2019-03-11 MED FILL — ARMODAFINIL 250 MG TABLET: 250 | 30 days supply | Qty: 30 | Fill #1

## 2019-04-08 ENCOUNTER — Other Ambulatory Visit: Payer: Self-pay | Admitting: Internal Medicine

## 2019-04-08 MED FILL — ARMODAFINIL 250 MG TABLET: 250 | 30 days supply | Qty: 30 | Fill #2

## 2019-04-08 MED FILL — LOSARTAN POTASSIUM 100 MG T: 100 | 30 days supply | Qty: 30 | Fill #2

## 2019-04-08 MED FILL — FLUoxetine HCL 40 MG CAPS: 40 | 90 days supply | Qty: 90 | Fill #1

## 2019-04-09 MED FILL — CHLORDIAZEPOXIDE 5 MG CAP: 5 | 90 days supply | Qty: 270 | Fill #0

## 2019-04-11 MED FILL — HYDROCHLOROTHIAZIDE 12.5 MG: 12.5 | 30 days supply | Qty: 30 | Fill #0

## 2019-05-13 MED FILL — ARMODAFINIL 250 MG TABLET: 250 | 90 days supply | Qty: 90 | Fill #0

## 2019-05-22 ENCOUNTER — Other Ambulatory Visit: Payer: Self-pay | Admitting: Internal Medicine

## 2019-05-22 MED FILL — HYDROCHLOROTHIAZIDE 12.5 MG: 12.5 | 30 days supply | Qty: 30 | Fill #1

## 2019-05-22 MED FILL — LOSARTAN POTASSIUM 100 MG T: 100 | 30 days supply | Qty: 30 | Fill #0

## 2019-05-22 MED FILL — ZOLPIDEM TARTRATE 10 MG TAB: 10 | 90 days supply | Qty: 90 | Fill #0

## 2019-05-27 ENCOUNTER — Telehealth: Payer: Self-pay

## 2019-05-27 NOTE — Telephone Encounter (Signed)
Spoke with patient and got lab appt and then nurse visit set up pt told that we do not do blood testing

## 2019-05-27 NOTE — Telephone Encounter (Signed)
Copied from [place] [id number]. Topic: General - Other >> May 27, 2019  3:52 PM Rayann Heman wrote: Reason for CRM: pt called and stated that she would like to get 2nd shingles shot and other labs. Pt states that 36mo period has passed and would like to know if she could schedule this. Please advise would also like an order for blood typing and how much it will cost. >> May 27, 2019  4:27 PM Cox, Melburn Hake, CMA wrote: We do not draw for blood typing. Pt can contact [facility] or go to [facility] for testing.

## 2019-06-05 MED FILL — GABAPENTIN 300 MG CAPSULE: 300 | 90 days supply | Qty: 270 | Fill #0

## 2019-06-20 ENCOUNTER — Other Ambulatory Visit (INDEPENDENT_AMBULATORY_CARE_PROVIDER_SITE_OTHER): Payer: BC Managed Care – PPO

## 2019-06-20 ENCOUNTER — Ambulatory Visit (INDEPENDENT_AMBULATORY_CARE_PROVIDER_SITE_OTHER): Payer: BC Managed Care – PPO

## 2019-06-20 ENCOUNTER — Other Ambulatory Visit: Payer: Self-pay

## 2019-06-20 DIAGNOSIS — E559 Vitamin D deficiency, unspecified: Secondary | ICD-10-CM | POA: Diagnosis not present

## 2019-06-20 DIAGNOSIS — R748 Abnormal levels of other serum enzymes: Secondary | ICD-10-CM | POA: Diagnosis not present

## 2019-06-20 DIAGNOSIS — Z23 Encounter for immunization: Secondary | ICD-10-CM

## 2019-06-20 LAB — HEPATIC FUNCTION PANEL
ALT: 16 U/L (ref 0–35)
AST: 22 U/L (ref 0–37)
Albumin: 4.4 g/dL (ref 3.5–5.2)
Alkaline Phosphatase: 151 U/L — ABNORMAL HIGH (ref 39–117)
Bilirubin, Direct: 0.1 mg/dL (ref 0.0–0.3)
Total Bilirubin: 0.4 mg/dL (ref 0.2–1.2)
Total Protein: 7 g/dL (ref 6.0–8.3)

## 2019-06-20 LAB — VITAMIN D 25 HYDROXY (VIT D DEFICIENCY, FRACTURES): VITD: 32.46 ng/mL (ref 30.00–100.00)

## 2019-06-20 LAB — GAMMA GT: GGT: 25 U/L (ref 7–51)

## 2019-06-20 NOTE — Progress Notes (Signed)
Pt tolerated shot well 

## 2019-06-23 MED FILL — LOSARTAN POTASSIUM 100 MG T: 100 | 30 days supply | Qty: 30 | Fill #1

## 2019-07-16 DIAGNOSIS — F900 Attention-deficit hyperactivity disorder, predominantly inattentive type: Secondary | ICD-10-CM | POA: Diagnosis not present

## 2019-07-24 MED FILL — ADDERALL XR 30 MG CAP SA: 30 | 90 days supply | Qty: 90 | Fill #0

## 2019-07-24 MED FILL — LOSARTAN POTASSIUM 100 MG T: 100 | 30 days supply | Qty: 30 | Fill #2

## 2019-07-24 MED FILL — AMPHETAMINE-DEXTROAMPHETAMI: 20 | 90 days supply | Qty: 90 | Fill #0

## 2019-08-18 MED FILL — ZOLPIDEM TARTRATE 10 MG TAB: 10 | 90 days supply | Qty: 90 | Fill #0

## 2019-08-28 ENCOUNTER — Other Ambulatory Visit: Payer: Self-pay | Admitting: Internal Medicine

## 2019-08-29 MED FILL — LOSARTAN POTASSIUM 100 MG T: 100 | 30 days supply | Qty: 30 | Fill #0

## 2019-08-29 MED FILL — FLUoxetine HCL 40 MG CAPS: 40 | 90 days supply | Qty: 90 | Fill #0

## 2019-09-09 DIAGNOSIS — Z1231 Encounter for screening mammogram for malignant neoplasm of breast: Secondary | ICD-10-CM | POA: Diagnosis not present

## 2019-09-09 DIAGNOSIS — Z681 Body mass index (BMI) 19 or less, adult: Secondary | ICD-10-CM | POA: Diagnosis not present

## 2019-09-09 DIAGNOSIS — Z01419 Encounter for gynecological examination (general) (routine) without abnormal findings: Secondary | ICD-10-CM | POA: Diagnosis not present

## 2019-09-10 DIAGNOSIS — R1032 Left lower quadrant pain: Secondary | ICD-10-CM | POA: Diagnosis not present

## 2019-09-10 DIAGNOSIS — F900 Attention-deficit hyperactivity disorder, predominantly inattentive type: Secondary | ICD-10-CM | POA: Diagnosis not present

## 2019-09-10 DIAGNOSIS — F3342 Major depressive disorder, recurrent, in full remission: Secondary | ICD-10-CM | POA: Diagnosis not present

## 2019-09-10 MED FILL — CHANTIX STARTING MONTH BOX: 0.5 MG X 11 | 28 days supply | Qty: 53 | Fill #0

## 2019-09-15 MED FILL — GABAPENTIN 300 MG CAPSULE: 300 | 90 days supply | Qty: 270 | Fill #0

## 2019-09-25 MED FILL — CHLORDIAZEPOXIDE 5 MG CAP: 5 | 90 days supply | Qty: 270 | Fill #0

## 2019-10-10 ENCOUNTER — Other Ambulatory Visit: Payer: Self-pay | Admitting: Internal Medicine

## 2019-10-10 MED FILL — HYDROCHLOROTHIAZIDE 12.5 MG: 12.5 | 30 days supply | Qty: 30 | Fill #2

## 2019-10-10 MED FILL — LOSARTAN POTASSIUM 100 MG T: 100 | 30 days supply | Qty: 30 | Fill #0

## 2019-11-14 MED FILL — ZOLPIDEM TARTRATE 10 MG TAB: 10 | 90 days supply | Qty: 90 | Fill #0

## 2019-11-24 ENCOUNTER — Other Ambulatory Visit: Payer: Self-pay | Admitting: Internal Medicine

## 2019-11-24 MED FILL — ADDERALL XR 30 MG CAP SA: 30 | 90 days supply | Qty: 90 | Fill #0

## 2019-11-24 MED FILL — AMPHETAMINE-DEXTROAMPHETAMI: 20 | 90 days supply | Qty: 90 | Fill #0

## 2019-11-24 MED FILL — LOSARTAN POTASSIUM 100 MG T: 100 | 30 days supply | Qty: 30 | Fill #0

## 2019-11-24 MED FILL — HYDROCHLOROTHIAZIDE 12.5 MG: 12.5 | 30 days supply | Qty: 30 | Fill #0

## 2019-12-18 DIAGNOSIS — Z23 Encounter for immunization: Secondary | ICD-10-CM | POA: Diagnosis not present

## 2019-12-25 ENCOUNTER — Telehealth: Payer: Self-pay | Admitting: Internal Medicine

## 2019-12-25 NOTE — Telephone Encounter (Signed)
This patient is wanting to come in the office to discuss her BP medication and to have Dr. Regis Bill look at a spot on her leg.  She wants to schedule for the afternoon on either the 8th or the 9th of April.   Can we schedule her in a virtual slot for in office on one of those days?  Please advise

## 2019-12-29 NOTE — Telephone Encounter (Signed)
Hey, can you schedule this patient per Dr. Regis Bill? Thank you!

## 2019-12-29 NOTE — Telephone Encounter (Signed)
Please see message.  Please advise. 

## 2019-12-29 NOTE — Telephone Encounter (Signed)
2 pm on april 9th  In person visit   ok

## 2019-12-31 NOTE — Telephone Encounter (Signed)
Pt has been scheduled.  °

## 2020-01-01 ENCOUNTER — Other Ambulatory Visit: Payer: Self-pay

## 2020-01-02 ENCOUNTER — Encounter: Payer: Self-pay | Admitting: Internal Medicine

## 2020-01-02 ENCOUNTER — Ambulatory Visit (INDEPENDENT_AMBULATORY_CARE_PROVIDER_SITE_OTHER): Payer: BC Managed Care – PPO | Admitting: Internal Medicine

## 2020-01-02 ENCOUNTER — Other Ambulatory Visit: Payer: Self-pay

## 2020-01-02 VITALS — BP 138/84 | HR 76 | Temp 98.4°F | Ht 66.0 in | Wt 113.8 lb

## 2020-01-02 DIAGNOSIS — R748 Abnormal levels of other serum enzymes: Secondary | ICD-10-CM | POA: Diagnosis not present

## 2020-01-02 DIAGNOSIS — E559 Vitamin D deficiency, unspecified: Secondary | ICD-10-CM

## 2020-01-02 DIAGNOSIS — I10 Essential (primary) hypertension: Secondary | ICD-10-CM

## 2020-01-02 DIAGNOSIS — Z79899 Other long term (current) drug therapy: Secondary | ICD-10-CM

## 2020-01-02 DIAGNOSIS — R21 Rash and other nonspecific skin eruption: Secondary | ICD-10-CM

## 2020-01-02 DIAGNOSIS — Z720 Tobacco use: Secondary | ICD-10-CM

## 2020-01-02 LAB — HEPATIC FUNCTION PANEL
ALT: 21 U/L (ref 0–35)
AST: 26 U/L (ref 0–37)
Albumin: 4.2 g/dL (ref 3.5–5.2)
Alkaline Phosphatase: 94 U/L (ref 39–117)
Bilirubin, Direct: 0.1 mg/dL (ref 0.0–0.3)
Total Bilirubin: 0.4 mg/dL (ref 0.2–1.2)
Total Protein: 6.3 g/dL (ref 6.0–8.3)

## 2020-01-02 LAB — BASIC METABOLIC PANEL
BUN: 13 mg/dL (ref 6–23)
CO2: 29 mEq/L (ref 19–32)
Calcium: 9.3 mg/dL (ref 8.4–10.5)
Chloride: 101 mEq/L (ref 96–112)
Creatinine, Ser: 0.61 mg/dL (ref 0.40–1.20)
GFR: 98.56 mL/min (ref >60.00)
Glucose, Bld: 87 mg/dL (ref 70–99)
Potassium: 4.9 mEq/L (ref 3.5–5.1)
Sodium: 138 mEq/L (ref 135–145)

## 2020-01-02 LAB — CBC WITH DIFFERENTIAL/PLATELET
Basophils Absolute: 0.1 10*3/uL (ref 0.0–0.1)
Basophils Relative: 0.7 % (ref 0.0–3.0)
Eosinophils Absolute: 0.3 10*3/uL (ref 0.0–0.7)
Eosinophils Relative: 4.4 % (ref 0.0–5.0)
HCT: 37.1 % (ref 36.0–46.0)
Hemoglobin: 12.6 g/dL (ref 12.0–15.0)
Lymphocytes Relative: 25.5 % (ref 12.0–46.0)
Lymphs Abs: 1.8 10*3/uL (ref 0.7–4.0)
MCHC: 34 g/dL (ref 30.0–36.0)
MCV: 96.6 fl (ref 78.0–100.0)
Monocytes Absolute: 0.5 10*3/uL (ref 0.1–1.0)
Monocytes Relative: 7.7 % (ref 3.0–12.0)
Neutro Abs: 4.4 10*3/uL (ref 1.4–7.7)
Neutrophils Relative %: 61.7 % (ref 43.0–77.0)
Platelets: 278 10*3/uL (ref 150.0–400.0)
RBC: 3.84 Mil/uL — ABNORMAL LOW (ref 3.87–5.11)
RDW: 13.4 % (ref 11.5–15.5)
WBC: 7.1 10*3/uL (ref 4.0–10.5)

## 2020-01-02 LAB — LIPID PANEL
Cholesterol: 185 mg/dL (ref 0–200)
HDL: 70.8 mg/dL (ref >39.00)
LDL Cholesterol: 102 mg/dL — ABNORMAL HIGH (ref 0–99)
NonHDL: 113.97
Total CHOL/HDL Ratio: 3
Triglycerides: 62 mg/dL (ref 0.0–149.0)
VLDL: 12.4 mg/dL (ref 0.0–40.0)

## 2020-01-02 LAB — HEMOGLOBIN A1C: Hgb A1c MFr Bld: 5.2 % (ref 4.6–6.5)

## 2020-01-02 LAB — TSH: TSH: 1.58 u[IU]/mL (ref 0.35–4.50)

## 2020-01-02 LAB — VITAMIN D 25 HYDROXY (VIT D DEFICIENCY, FRACTURES): VITD: 60.37 ng/mL (ref 30.00–100.00)

## 2020-01-02 MED ORDER — FLUOCINONIDE 0.05 % EX OINT: 1.0000 "application " | TOPICAL_OINTMENT | Freq: Two times a day (BID) | CUTANEOUS | 1 refills | Status: DC

## 2020-01-02 MED ORDER — LOSARTAN POTASSIUM 100 MG PO TABS: 100.0000 mg | ORAL_TABLET | Freq: Every day | ORAL | 6 refills | Status: DC

## 2020-01-02 MED FILL — FLUOCINONIDE 0.05% OINTMENT: 0.05 | 7 days supply | Qty: 15 | Fill #0

## 2020-01-02 MED FILL — LOSARTAN POTASSIUM 100 MG T: 100 | 30 days supply | Qty: 30 | Fill #0

## 2020-01-02 NOTE — Progress Notes (Signed)
This visit occurred during the SARS-CoV-2 public health emergency.  Safety protocols were in place, including screening questions prior to the visit, additional usage of staff PPE, and extensive cleaning of exam room while observing appropriate contact time as indicated for disinfecting solutions.    Chief Complaint  Patient presents with  . Medication Management    Discuss medication  . Rash    outer side of right ankle, red and itches and broken skin and really itches, red bumps on arms and itch also    HPI: Jasmine Nelson 64 y.o. come in for Chronic disease management  Last visis with me wwa 2 20 PV  Overdue for med check   BP   Up and down need srefill losartan  No se noted  Rash place on leg for a long time  Ever since fall at    Had  ? Abrasion there? Then since then scratches and makes work over time   Uses otc h[facility] and [place] used a top with help but keeps scratcking and on going    Had first covid inj  Few papules upper  Body  Some itch  Has cats noknow infestiations  GYne cjecl 12 18 physicains  a for  Women  And  Left lower   Pelvic pain   Was dissappointed that she didn't know that pap wasn't done cause not  Needed but UTd  ROS: See pertinent positives and negatives per HPI. Had plantar fasciitis   And had  High bp and then at gyne was low.  Still using tobacco  ? chantrix  Has gi sx  Taking otc vit D daily Past Medical History:  Diagnosis Date  . Allergy   . Anxiety   . Cataract    both eyes  . Chicken pox   . COPD (chronic obstructive pulmonary disease) ([place])   . Depression   . Glaucoma   . Hamstring injury    torn  . Hx of thyroid disease    3rd grade  resolved   . Hypertension   . Ingestion of substance    3 04  . Osteoporosis   . Shift work sleep disorder     Family History  Problem Relation Age of Onset  . Cancer Mother 64       Died at 59 of Non-Hodgkins Lymphoma  . Lymphoma Mother        41  . Colon polyps Mother        "many";  required regular colonoscopies  . Alcohol abuse Father   . COPD Father   . Depression Father   . Hyperlipidemia Father   . Hypertension Father        deceased  . Colon polyps Father        "many"; required regular colonoscopies  . Colon cancer Sister        dx. 36-56  . Other Sister        +CHEK2 mutation  . COPD Maternal Aunt   . Ovarian cancer Maternal Aunt 62  . Stroke Maternal Grandmother   . Heart Problems Maternal Grandmother   . Cancer Maternal Grandfather 22       Brain Cancer  . Arthritis Paternal Grandmother   . Depression Paternal Grandmother   . Glaucoma Paternal Grandmother   . Breast cancer Paternal Grandmother        dx. 26s  . Uterine cancer Paternal Grandmother        dx. 40s  . Renal  cancer Paternal Grandfather        smoker  . Multiple myeloma Maternal Uncle        dx. well before his 48s  . Ovarian cancer Other   . Esophageal cancer Neg Hx   . Stomach cancer Neg Hx   . Rectal cancer Neg Hx     Social History   Socioeconomic History  . Marital status: Married    Spouse name: Not on file  . Number of children: Not on file  . Years of education: Not on file  . Highest education level: Not on file  Occupational History  . Not on file  Tobacco Use  . Smoking status: Current Every Day Smoker    Packs/day: 1.00    Types: Cigarettes  . Smokeless tobacco: Never Used  Substance and Sexual Activity  . Alcohol use: No  . Drug use: No  . Sexual activity: Not on file  Other Topics Concern  . Not on file  Social History Narrative   Averages 5-8 hours of sleep per day.   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   [id number]      Social Determinants of Health   Financial Resource Strain:   . Difficulty of Paying Living Expenses:   Food Insecurity:   . Worried About Charity fundraiser in the Last Year:   .  Arboriculturist in the Last Year:   Transportation Needs:   . Film/video editor (Medical):   Marland Kitchen Lack of Transportation (Non-Medical):   Physical Activity:   . Days of Exercise per Week:   . Minutes of Exercise per Session:   Stress:   . Feeling of Stress :   Social Connections:   . Frequency of Communication with Friends and Family:   . Frequency of Social Gatherings with Friends and Family:   . Attends Religious Services:   . Active Member of Clubs or Organizations:   . Attends Archivist Meetings:   Marland Kitchen Marital Status:     Outpatient Medications Prior to Visit  Medication Sig Dispense Refill  . ADDERALL XR 30 MG 24 hr capsule     . amphetamine-dextroamphetamine (ADDERALL) 20 MG tablet Take 20 mg by mouth daily.    . calcium citrate-vitamin D (CITRACAL+D) 315-200 MG-UNIT tablet Take 1 tablet by mouth 2 (two) times daily.    . CHANTIX STARTING MONTH PAK 0.5 MG X 11 & 1 MG X 42 tablet     . chlordiazePOXIDE (LIBRIUM) 5 MG capsule Take 5 mg by mouth 2 (two) times daily.   0  . Cholecalciferol (VITAMIN D PO) Take by mouth.    Marland Kitchen FLUoxetine (PROZAC) 40 MG capsule fluoxetine 40 mg capsule    . gabapentin (NEURONTIN) 300 MG capsule Take 300 mg by mouth 3 (three) times daily.     . hydrochlorothiazide (HYDRODIURIL) 12.5 MG tablet Take 1 tablet (12.5 mg total) by mouth daily. NEEDS OFFICE VISIT FOR FURTHER REFILLS 30 tablet 0  . Lactobacillus (PROBIOTIC ACIDOPHILUS PO) Take 1 capsule by mouth daily.    . RESTASIS 0.05 % ophthalmic emulsion     . Zinc Sulfate (ZINC 15 PO) Take 1 tablet by mouth daily.    Marland Kitchen zolpidem (AMBIEN) 10 MG tablet Take 10  mg by mouth at bedtime as needed for sleep.    Marland Kitchen losartan (COZAAR) 100 MG tablet Take 1 tablet (100 mg total) by mouth daily. NEEDS OFFICE VISIT FOR FURTHER REFILLS 30 tablet 0  . Armodafinil (NUVIGIL) 150 MG tablet Take 150 mg by mouth daily.    . Vitamin D, Ergocalciferol, (DRISDOL) 1.25 MG (50000 UT) CAPS capsule Take 1 capsule (50,000  Units total) by mouth every 7 (seven) days. (Patient not taking: Reported on 01/02/2020) 12 capsule 0  . vortioxetine HBr (TRINTELLIX) 20 MG TABS Take 20 mg by mouth daily.    Marland Kitchen azithromycin (ZITHROMAX) 250 MG tablet   0  . losartan (COZAAR) 100 MG tablet      Facility-Administered Medications Prior to Visit  Medication Dose Route Frequency Provider Last Rate Last Admin  . 0.9 %  sodium chloride infusion  500 mL Intravenous Once Nandigam, Kavitha V, MD         EXAM:  BP 138/84   Pulse 76   Temp 98.4 F (36.9 C) (Temporal)   Ht '5\' 6"'  (1.676 m)   Wt 113 lb 12.8 oz (51.6 kg)   SpO2 98%   BMI 18.37 kg/m   Body mass index is 18.37 kg/m. Wt Readings from Last 3 Encounters:  01/02/20 113 lb 12.8 oz (51.6 kg)  11/08/18 119 lb 3.2 oz (54.1 kg)  01/03/18 114 lb (51.7 kg)    GENERAL: vitals reviewed and listed above, alert, oriented, appears well hydrated and in no acute distress HEENT: atraumatic, conjunctiva  clear, no obvious abnormalities on inspection of external nose and ears OP : masked  Slender    NECK: no obvious masses on inspection palpation  LUNGS: clear to auscultation bilaterally, no wheezes, rales or rhonchi, good air movement mild kyphosis  CV: HRRR, no clubbing cyanosis or  peripheral edema nl cap refill  MS: moves all extremities without noticeable focal  Abnormality Skin right le with flaky excoriated with red base area   No moisture or blister or  Ecchymosis  PSYCH: pleasant and cooperative,     Lab Results  Component Value Date   WBC 7.1 01/02/2020   HGB 12.6 01/02/2020   HCT 37.1 01/02/2020   PLT 278.0 01/02/2020   GLUCOSE 87 01/02/2020   CHOL 185 01/02/2020   TRIG 62.0 01/02/2020   HDL 70.80 01/02/2020   LDLCALC 102 (H) 01/02/2020   ALT 21 01/02/2020   AST 26 01/02/2020   NA 138 01/02/2020   K 4.9 01/02/2020   CL 101 01/02/2020   CREATININE 0.61 01/02/2020   BUN 13 01/02/2020   CO2 29 01/02/2020   TSH 1.58 01/02/2020   HGBA1C 5.2 01/02/2020    BP Readings from Last 3 Encounters:  01/02/20 138/84  11/14/18 (!) 156/94  11/08/18 118/62    ASSESSMENT AND PLAN:  Discussed the following assessment and plan:  Essential hypertension - Plan: Basic metabolic panel, CBC with Differential/Platelet, Hemoglobin A1c, Hepatic function panel, Lipid panel, TSH  Medication management - Plan: Basic metabolic panel, CBC with Differential/Platelet, Hemoglobin A1c, Hepatic function panel, Lipid panel, TSH  Rash in adult - Plan: Basic metabolic panel, CBC with Differential/Platelet, Hemoglobin A1c, Hepatic function panel, Lipid panel, TSH  Elevated alkaline phosphatase level - Plan: Basic metabolic panel, CBC with Differential/Platelet, Hemoglobin A1c, Hepatic function panel, Lipid panel, TSH, VITAMIN D 25 Hydroxy (Vit-D Deficiency, Fractures)  Vitamin D deficiency - Plan: Basic metabolic panel, CBC with Differential/Platelet, Hemoglobin A1c, Hepatic function panel, Lipid panel, TSH, VITAMIN D 25 Hydroxy (  Vit-D Deficiency, Fractures)  Tobacco user Over due labs now  Today  Non fasting  Plan potent steroid and avoid itch scratch cycle and  If  persistent or progressive see derm.  Return for depending on results.  -Patient advised to return or notify health care team  if  new concerns arise. In interim  30 minutes  Review gyne record counsel  And evaluation meds and se.  Stopping tobacco  Patient Instructions    Nothing on rash  that could break you out .  add  Lidex topical and  dont scratch  Can use cold compress  If  feel like need to itch.    Expect  To improve in 2 weeks     Can you send in picture or do my char  visit to see .   Some otc meds can make you itch more  If becomes sensitive    If   Ongoing .   May need to see  Dermatology .  Will notify you  of labs when available.   Then plan follow up.  As indicated    [employer]. Danique Hartsough M.D.

## 2020-01-02 NOTE — Patient Instructions (Addendum)
Nothing on rash  that could break you out .  add  Lidex topical and  dont scratch  Can use cold compress  If  feel like need to itch.    Expect  To improve in 2 weeks     Can you send in picture or do my char  visit to see .   Some otc meds can make you itch more  If becomes sensitive    If   Ongoing .   May need to see  Dermatology .  Will notify you  of labs when available.   Then plan follow up.  As indicated

## 2020-01-05 ENCOUNTER — Other Ambulatory Visit: Payer: Self-pay | Admitting: Internal Medicine

## 2020-01-05 MED FILL — CHLORDIAZEPOXIDE 5 MG CAP: 5 | 90 days supply | Qty: 270 | Fill #0

## 2020-01-05 MED FILL — FLUoxetine HCL 40 MG CAPS: 40 | 90 days supply | Qty: 90 | Fill #1

## 2020-01-05 MED FILL — HYDROCHLOROTHIAZIDE 12.5 MG: 12.5 | 90 days supply | Qty: 90 | Fill #0

## 2020-01-06 NOTE — Progress Notes (Signed)
Results  vit D very good  level  Other results  normal or acceptable levels

## 2020-01-13 DIAGNOSIS — Z13820 Encounter for screening for osteoporosis: Secondary | ICD-10-CM | POA: Diagnosis not present

## 2020-01-14 DIAGNOSIS — Z23 Encounter for immunization: Secondary | ICD-10-CM | POA: Diagnosis not present

## 2020-01-23 MED FILL — GABAPENTIN 300 MG CAPSULE: 300 | 90 days supply | Qty: 270 | Fill #1

## 2020-02-14 MED FILL — ZOLPIDEM TARTRATE 10 MG TAB: 10 | 30 days supply | Qty: 30 | Fill #0

## 2020-03-03 ENCOUNTER — Other Ambulatory Visit (HOSPITAL_COMMUNITY): Payer: Self-pay | Admitting: Specialist

## 2020-03-03 DIAGNOSIS — F3342 Major depressive disorder, recurrent, in full remission: Secondary | ICD-10-CM | POA: Diagnosis not present

## 2020-03-03 DIAGNOSIS — F900 Attention-deficit hyperactivity disorder, predominantly inattentive type: Secondary | ICD-10-CM | POA: Diagnosis not present

## 2020-03-03 DIAGNOSIS — G4709 Other insomnia: Secondary | ICD-10-CM | POA: Diagnosis not present

## 2020-03-08 MED FILL — LOSARTAN POTASSIUM 100 MG T: 100 | 30 days supply | Qty: 30 | Fill #1

## 2020-03-15 MED FILL — ZOLPIDEM TARTRATE 10 MG TAB: 10 | 90 days supply | Qty: 90 | Fill #0

## 2020-04-16 MED FILL — LOSARTAN POTASSIUM 100 MG T: 100 | 30 days supply | Qty: 30 | Fill #2

## 2020-04-22 MED FILL — FLUOCINONIDE 0.05% OINTMENT: 0.05 | 7 days supply | Qty: 15 | Fill #1

## 2020-05-03 MED FILL — GABAPENTIN 300 MG CAPSULE: 300 | 90 days supply | Qty: 270 | Fill #2

## 2020-05-19 ENCOUNTER — Other Ambulatory Visit: Payer: Self-pay | Admitting: Internal Medicine

## 2020-05-19 MED FILL — LOSARTAN POTASSIUM 100 MG T: 100 | 30 days supply | Qty: 30 | Fill #3

## 2020-05-19 MED FILL — FLUoxetine HCL 40 MG CAPS: 40 | 90 days supply | Qty: 90 | Fill #2

## 2020-05-19 MED FILL — HYDROCHLOROTHIAZIDE 12.5 MG: 12.5 | 90 days supply | Qty: 90 | Fill #0

## 2020-06-21 MED FILL — LOSARTAN POTASSIUM 100 MG T: 100 | 90 days supply | Qty: 90 | Fill #4

## 2020-06-24 MED FILL — AMPHETAMINE-DEXTROAMPHET ER: 30 | 90 days supply | Qty: 90 | Fill #0

## 2020-06-30 DIAGNOSIS — Z23 Encounter for immunization: Secondary | ICD-10-CM | POA: Diagnosis not present

## 2020-07-09 MED FILL — GABAPENTIN 600 MG TABLET: 600 | 90 days supply | Qty: 450 | Fill #0

## 2020-07-24 MED FILL — traZODone HCL 50 MG TABS: 50 | 90 days supply | Qty: 90 | Fill #1

## 2020-08-25 ENCOUNTER — Other Ambulatory Visit (HOSPITAL_COMMUNITY): Payer: Self-pay | Admitting: Specialist

## 2020-08-25 MED FILL — FLUoxetine HCL 40 MG CAPS: 40 | 90 days supply | Qty: 90 | Fill #0

## 2020-08-25 MED FILL — GABAPENTIN 400 MG CAPSULE: 400 | 90 days supply | Qty: 360 | Fill #0

## 2020-08-25 MED FILL — ZOLPIDEM TARTRATE 10 MG TAB: 10 | 90 days supply | Qty: 90 | Fill #0

## 2020-09-09 DIAGNOSIS — Z681 Body mass index (BMI) 19 or less, adult: Secondary | ICD-10-CM | POA: Diagnosis not present

## 2020-09-09 DIAGNOSIS — Z124 Encounter for screening for malignant neoplasm of cervix: Secondary | ICD-10-CM | POA: Diagnosis not present

## 2020-09-09 DIAGNOSIS — Z1231 Encounter for screening mammogram for malignant neoplasm of breast: Secondary | ICD-10-CM | POA: Diagnosis not present

## 2020-09-09 DIAGNOSIS — Z779 Other contact with and (suspected) exposures hazardous to health: Secondary | ICD-10-CM | POA: Diagnosis not present

## 2020-09-16 ENCOUNTER — Other Ambulatory Visit: Payer: Self-pay | Admitting: Obstetrics & Gynecology

## 2020-09-16 DIAGNOSIS — R928 Other abnormal and inconclusive findings on diagnostic imaging of breast: Secondary | ICD-10-CM

## 2020-09-27 ENCOUNTER — Other Ambulatory Visit: Payer: Self-pay | Admitting: Internal Medicine

## 2020-09-27 MED FILL — HYDROCHLOROTHIAZIDE 12.5 MG: 12.5 | 90 days supply | Qty: 90 | Fill #0

## 2020-10-08 ENCOUNTER — Ambulatory Visit: Payer: BC Managed Care – PPO

## 2020-10-08 ENCOUNTER — Other Ambulatory Visit: Payer: Self-pay

## 2020-10-08 ENCOUNTER — Ambulatory Visit
Admission: RE | Admit: 2020-10-08 | Discharge: 2020-10-08 | Disposition: A | Discharge status: Home / Self Care | Payer: BC Managed Care – PPO | Source: Ambulatory Visit | Attending: Obstetrics & Gynecology | Admitting: Obstetrics & Gynecology

## 2020-10-08 DIAGNOSIS — R928 Other abnormal and inconclusive findings on diagnostic imaging of breast: Secondary | ICD-10-CM

## 2020-10-08 DIAGNOSIS — R922 Inconclusive mammogram: Secondary | ICD-10-CM | POA: Diagnosis not present

## 2020-10-15 ENCOUNTER — Other Ambulatory Visit: Payer: Self-pay | Admitting: Internal Medicine

## 2020-10-15 MED FILL — LOSARTAN POTASSIUM 100 MG T: 100 | 30 days supply | Qty: 30 | Fill #0

## 2020-11-17 MED FILL — LOSARTAN POTASSIUM 100 MG T: 100 | 30 days supply | Qty: 30 | Fill #1

## 2020-11-22 MED FILL — GABAPENTIN 400 MG CAPSULE: 400 | 90 days supply | Qty: 360 | Fill #1

## 2020-12-14 ENCOUNTER — Other Ambulatory Visit (HOSPITAL_COMMUNITY): Payer: Self-pay | Admitting: Specialist

## 2020-12-14 MED FILL — LOSARTAN POTASSIUM 100 MG T: 100 | 30 days supply | Qty: 30 | Fill #2

## 2020-12-15 MED FILL — ZOLPIDEM TARTRATE 10 MG TAB: 10 | 90 days supply | Qty: 90 | Fill #0

## 2020-12-16 ENCOUNTER — Other Ambulatory Visit (HOSPITAL_BASED_OUTPATIENT_CLINIC_OR_DEPARTMENT_OTHER): Payer: Self-pay

## 2021-01-03 ENCOUNTER — Other Ambulatory Visit (HOSPITAL_COMMUNITY): Payer: Self-pay

## 2021-01-03 MED FILL — Fluoxetine HCl Cap 40 MG: ORAL | 90 days supply | Qty: 90 | Fill #0 | Status: AC

## 2021-01-04 ENCOUNTER — Other Ambulatory Visit (HOSPITAL_COMMUNITY): Payer: Self-pay

## 2021-01-18 ENCOUNTER — Other Ambulatory Visit: Payer: Self-pay

## 2021-01-18 ENCOUNTER — Other Ambulatory Visit (HOSPITAL_COMMUNITY): Payer: Self-pay

## 2021-01-18 ENCOUNTER — Other Ambulatory Visit: Payer: Self-pay | Admitting: Internal Medicine

## 2021-01-18 MED FILL — Gabapentin Cap 400 MG: ORAL | 90 days supply | Qty: 360 | Fill #0 | Status: AC

## 2021-01-18 NOTE — Telephone Encounter (Signed)
Refill needed for Losartan 100mg .

## 2021-01-18 NOTE — Progress Notes (Unsigned)
Refill needed for Losartan 100 mg tabs

## 2021-01-19 ENCOUNTER — Other Ambulatory Visit (HOSPITAL_COMMUNITY): Payer: Self-pay

## 2021-01-19 ENCOUNTER — Other Ambulatory Visit: Payer: Self-pay | Admitting: Internal Medicine

## 2021-01-19 MED ORDER — HYDROCHLOROTHIAZIDE 12.5 MG PO TABS
ORAL_TABLET | Freq: Every day | ORAL | 0 refills | Status: DC
  Filled 2021-01-19: qty 30, 30d supply, fill #0

## 2021-01-19 MED ORDER — LOSARTAN POTASSIUM 100 MG PO TABS
ORAL_TABLET | Freq: Every day | ORAL | 0 refills | Status: DC
Start: 2021-01-19 — End: 2021-03-22
  Filled 2021-01-19: qty 30, 30d supply, fill #0

## 2021-01-19 NOTE — Telephone Encounter (Signed)
Spoke with the patient and she stated she would contact the office at a later time to schedule and OV. RX sent for 30 day supply until visit is scheduled.

## 2021-01-19 NOTE — Telephone Encounter (Signed)
As  noted her last visit with me was over a year ago.  Make her a yearly visit  then can order enough medicine for 2 months to give her enough until she can have her visit.    she will need lab work monitoring.

## 2021-01-25 ENCOUNTER — Other Ambulatory Visit (HOSPITAL_COMMUNITY): Payer: Self-pay

## 2021-01-29 ENCOUNTER — Other Ambulatory Visit (HOSPITAL_COMMUNITY): Payer: Self-pay

## 2021-02-16 ENCOUNTER — Other Ambulatory Visit (HOSPITAL_COMMUNITY): Payer: Self-pay

## 2021-02-16 MED ORDER — AMPHETAMINE-DEXTROAMPHETAMINE 20 MG PO TABS
ORAL_TABLET | ORAL | 0 refills | Status: DC
  Filled 2021-02-16: qty 180, 90d supply, fill #0

## 2021-02-16 MED ORDER — GABAPENTIN 400 MG PO CAPS
ORAL_CAPSULE | ORAL | 4 refills | Status: DC
  Filled 2021-02-16 – 2021-08-26 (×2): qty 540, 90d supply, fill #0
  Filled 2021-12-12: qty 540, 90d supply, fill #1

## 2021-02-18 ENCOUNTER — Other Ambulatory Visit (HOSPITAL_COMMUNITY): Payer: Self-pay

## 2021-02-18 MED ORDER — CLONIDINE HCL 0.1 MG PO TABS
ORAL_TABLET | ORAL | 0 refills | Status: DC
  Filled 2021-02-18: qty 90, 90d supply, fill #0

## 2021-03-22 ENCOUNTER — Telehealth (INDEPENDENT_AMBULATORY_CARE_PROVIDER_SITE_OTHER): Payer: PPO | Admitting: Internal Medicine

## 2021-03-22 ENCOUNTER — Encounter: Payer: Self-pay | Admitting: Internal Medicine

## 2021-03-22 ENCOUNTER — Other Ambulatory Visit (HOSPITAL_COMMUNITY): Payer: Self-pay

## 2021-03-22 VITALS — Ht 66.0 in

## 2021-03-22 DIAGNOSIS — R21 Rash and other nonspecific skin eruption: Secondary | ICD-10-CM

## 2021-03-22 DIAGNOSIS — F17200 Nicotine dependence, unspecified, uncomplicated: Secondary | ICD-10-CM | POA: Diagnosis not present

## 2021-03-22 DIAGNOSIS — E559 Vitamin D deficiency, unspecified: Secondary | ICD-10-CM

## 2021-03-22 DIAGNOSIS — I10 Essential (primary) hypertension: Secondary | ICD-10-CM

## 2021-03-22 DIAGNOSIS — R748 Abnormal levels of other serum enzymes: Secondary | ICD-10-CM | POA: Diagnosis not present

## 2021-03-22 DIAGNOSIS — Z79899 Other long term (current) drug therapy: Secondary | ICD-10-CM | POA: Diagnosis not present

## 2021-03-22 MED ORDER — HYDROCHLOROTHIAZIDE 12.5 MG PO TABS
ORAL_TABLET | Freq: Every day | ORAL | 0 refills | Status: DC
  Filled 2021-03-22: qty 90, 90d supply, fill #0

## 2021-03-22 MED ORDER — LOSARTAN POTASSIUM 100 MG PO TABS
ORAL_TABLET | Freq: Every day | ORAL | 0 refills | Status: DC
  Filled 2021-03-22: qty 90, 90d supply, fill #0

## 2021-03-22 NOTE — Progress Notes (Signed)
Virtual Visit via Video Note  I connected withNAME@ on 03/22/21 at  1:30 PM EDT by a video enabled telemedicine application and verified that I am speaking with the correct person using two identifiers. Location patient: home Location provider:work office Persons participating in the virtual visit: patient, provider  [place] national recommendations  regarding COVID 19 pandemic   video visit is advised over in office visit for this patient.  Patient aware  of the limitations of evaluation and management by telemedicine and  availability of in person appointments. and agreed to proceed.   HPI: OCTOBER PEERY presents for video visit for med eval  last check 4 2021  She has had hypertension treated with losartan 100 mg and HCTZ 12.5 mg ran out over a month ago because of either pharmacy not having it or no refills. She knows she is due for a yearly physical but she is now on Medicare and has had some barriers to making appointment. Her blood pressure today went up to 157/80 but then has been 138/80 128/870 a.  She has been out of medicine for a month.  She has had clonidine at night for sleep may help some. Asks about itchy rash that is now gone lasted almost a month bumps on her arms was severe thought it could have been shingles but no fever better She still having a rash on her lower extremity she has had over couple years some response to topicals but comes back asks about seeing dermatologist  She is currently retired sees Dr. Robina Ade and her OB/GYN Dr. Linda Hedges.  Sees Dr. Katy Fitch for [facility] ophthalmology   Continues to use tobacco down to half a pack per day breathing is not that good but no acute findings. She gets her medicines at [facility] and has it mailed.  ROS: See pertinent positives and negatives per HPI.  Past Medical History:  Diagnosis Date   Allergy    Anxiety    Cataract    both eyes   Chicken pox    COPD (chronic obstructive pulmonary disease) (HCC)    Depression     Glaucoma    Hamstring injury    torn   Hx of thyroid disease    3rd grade  resolved    Hypertension    Ingestion of substance    3 04   Osteoporosis    Shift work sleep disorder     Past Surgical History:  Procedure Laterality Date   ADENOIDECTOMY  1959   BREAST BIOPSY Right 2019   COLONOSCOPY      Family History  Problem Relation Age of Onset   Cancer Mother 40       Died at 63 of Non-Hodgkins Lymphoma   Lymphoma Mother        61   Colon polyps Mother        "many"; required regular colonoscopies   Alcohol abuse Father    COPD Father    Depression Father    Hyperlipidemia Father    Hypertension Father        deceased   Colon polyps Father        "many"; required regular colonoscopies   Colon cancer Sister        dx. 53-56   Other Sister        +CHEK2 mutation   COPD Maternal Aunt    Ovarian cancer Maternal Aunt 62   Stroke Maternal Grandmother    Heart Problems Maternal Grandmother    Cancer  Maternal Grandfather 55       Brain Cancer   Arthritis Paternal Grandmother    Depression Paternal Grandmother    Glaucoma Paternal Grandmother    Breast cancer Paternal Grandmother        dx. 50s   Uterine cancer Paternal Grandmother        dx. 36s   Renal cancer Paternal Grandfather        smoker   Multiple myeloma Maternal Uncle        dx. well before his 37s   Ovarian cancer Other    Esophageal cancer Neg Hx    Stomach cancer Neg Hx    Rectal cancer Neg Hx     Social History   Tobacco Use   Smoking status: Every Day    Packs/day: 1.00    Pack years: 0.00    Types: Cigarettes   Smokeless tobacco: Never  Vaping Use   Vaping Use: Never used  Substance Use Topics   Alcohol use: No   Drug use: No      Current Outpatient Medications:    ADDERALL XR 30 MG 24 hr capsule, , Disp: , Rfl:    amphetamine-dextroamphetamine (ADDERALL) 20 MG tablet, Take 20 mg by mouth daily., Disp: , Rfl:    amphetamine-dextroamphetamine (ADDERALL) 20 MG tablet, Take one  tablet by mouth in the morning and one tablet at noon, Disp: 180 tablet, Rfl: 0   calcium citrate-vitamin D (CITRACAL+D) 315-200 MG-UNIT tablet, Take 1 tablet by mouth 2 (two) times daily., Disp: , Rfl:    CHANTIX STARTING MONTH PAK 0.5 MG X 11 & 1 MG X 42 tablet, , Disp: , Rfl:    chlordiazePOXIDE (LIBRIUM) 5 MG capsule, Take 5 mg by mouth 2 (two) times daily. , Disp: , Rfl: 0   Cholecalciferol (VITAMIN D PO), Take by mouth., Disp: , Rfl:    cloNIDine (CATAPRES) 0.1 MG tablet, TAKE ONE TABLET BY MOUTH AT BEDTIME, Disp: 90 tablet, Rfl: 0   fluocinonide ointment (LIDEX) 1.22 %, Apply 1 application topically 2 (two) times daily. Please mail to patient, Disp: 30 g, Rfl: 1   FLUoxetine (PROZAC) 40 MG capsule, fluoxetine 40 mg capsule, Disp: , Rfl:    FLUoxetine (PROZAC) 40 MG capsule, TAKE 1 CAPSULE BY MOUTH ONCE A DAY, Disp: 90 capsule, Rfl: 4   gabapentin (NEURONTIN) 300 MG capsule, Take 300 mg by mouth 3 (three) times daily. , Disp: , Rfl:    gabapentin (NEURONTIN) 400 MG capsule, TAKE 3-4 CAPSULES BY MOUTH EVERY EVENING, Disp: 360 capsule, Rfl: 4   gabapentin (NEURONTIN) 400 MG capsule, Take one capsule by mouth twice a day and four capsules at bedtime, Disp: 540 capsule, Rfl: 4   Lactobacillus (PROBIOTIC ACIDOPHILUS PO), Take 1 capsule by mouth daily., Disp: , Rfl:    RESTASIS 0.05 % ophthalmic emulsion, , Disp: , Rfl:    Vitamin D, Ergocalciferol, (DRISDOL) 1.25 MG (50000 UT) CAPS capsule, Take 1 capsule (50,000 Units total) by mouth every 7 (seven) days., Disp: 12 capsule, Rfl: 0   vortioxetine HBr (TRINTELLIX) 20 MG TABS tablet, Take 20 mg by mouth daily., Disp: , Rfl:    Zinc Sulfate (ZINC 15 PO), Take 1 tablet by mouth daily., Disp: , Rfl:    zolpidem (AMBIEN) 10 MG tablet, Take 10 mg by mouth at bedtime as needed for sleep., Disp: , Rfl:    zolpidem (AMBIEN) 10 MG tablet, TAKE 1 TABLET BY MOUTH EVERY EVENING AS NEEDED, Disp: 90 tablet, Rfl: 0  hydrochlorothiazide (HYDRODIURIL) 12.5 MG  tablet, TAKE 1 TABLET BY MOUTH ONCE DAILY, Disp: 90 tablet, Rfl: 0   losartan (COZAAR) 100 MG tablet, TAKE 1 TABLET BY MOUTH ONCE DAILY, Disp: 90 tablet, Rfl: 0   zolpidem (AMBIEN) 10 MG tablet, TAKE 1 TABLET BY MOUTH EVERY EVENING AT BEDTIME AS NEEDED., Disp: 90 tablet, Rfl: 0  Current Facility-Administered Medications:    0.9 %  sodium chloride infusion, 500 mL, Intravenous, Once, Nandigam, Kavitha V, MD  EXAM: BP Readings from Last 3 Encounters:  01/02/20 138/84  11/14/18 (!) 156/94  11/08/18 118/62    VITALS per patient if applicable:  GENERAL: alert, oriented, appears well and in no acute distress  HEENT: atraumatic, conjunttiva clear, no obvious abnormalities on inspection of external nose and ears  NECK: normal movements of the head and neck  LUNGS: on inspection no signs of respiratory distress, breathing rate appears normal, no obvious gross SOB, gasping or wheezing  CV: no obvious cyanosis  MS: moves all visible extremities without noticeable abnormality  PSYCH/NEURO: pleasant and cooperative, increased motor activity no obvious depression , speech and thought processing grossly intact Lab Results  Component Value Date   WBC 7.1 01/02/2020   HGB 12.6 01/02/2020   HCT 37.1 01/02/2020   PLT 278.0 01/02/2020   GLUCOSE 87 01/02/2020   CHOL 185 01/02/2020   TRIG 62.0 01/02/2020   HDL 70.80 01/02/2020   LDLCALC 102 (H) 01/02/2020   ALT 21 01/02/2020   AST 26 01/02/2020   NA 138 01/02/2020   K 4.9 01/02/2020   CL 101 01/02/2020   CREATININE 0.61 01/02/2020   BUN 13 01/02/2020   CO2 29 01/02/2020   TSH 1.58 01/02/2020   HGBA1C 5.2 01/02/2020    ASSESSMENT AND PLAN:  Discussed the following assessment and plan:    ICD-10-CM   1. Essential hypertension  V89 Basic metabolic panel    CBC with Differential/Platelet    Hemoglobin A1c    Hepatic function panel    Lipid panel    TSH    2. Medication management  F81.017 Basic metabolic panel    CBC with  Differential/Platelet    Hemoglobin A1c    Hepatic function panel    Lipid panel    TSH    VITAMIN D 25 Hydroxy (Vit-D Deficiency, Fractures)    3. Elevated alkaline phosphatase level  P10.2 Basic metabolic panel    CBC with Differential/Platelet    Hemoglobin A1c    Hepatic function panel    Lipid panel    TSH    VITAMIN D 25 Hydroxy (Vit-D Deficiency, Fractures)    4. Vitamin D deficiency  H85.2 Basic metabolic panel    CBC with Differential/Platelet    Hemoglobin A1c    Hepatic function panel    Lipid panel    TSH    VITAMIN D 25 Hydroxy (Vit-D Deficiency, Fractures)    5. Tobacco dependence  F17.200     6. Rash in adult  R21     Out of medication sent into pharmacy 90 days After 3 to 4 weeks get lab work done and then a visit with me count as a yearly visit or physical what ever is appropriate. She is aware that stopping tobacco would be best for her health. In regard to her rash she can try getting appointment with [facility] dermatology but discussed skin hydration to avoid itch scratch cycle.  Counseled.   Expectant management and discussion of plan and treatment with opportunity to ask  questions and all were answered. The patient agreed with the plan and demonstrated an understanding of the instructions. Record review discussion counsel plan ordering 30 minutes Advised to call back or seek an in-person evaluation if worsening  or having  further concerns . Return for Fasting labs in 3 to 4 weeks then yearly visit.Shanon Ace, MD

## 2021-03-29 ENCOUNTER — Telehealth: Payer: Self-pay | Admitting: Internal Medicine

## 2021-03-29 DIAGNOSIS — R21 Rash and other nonspecific skin eruption: Secondary | ICD-10-CM

## 2021-03-29 NOTE — Telephone Encounter (Signed)
The patient had a virtual visit with Dr. Regis Bill on 03/22/2021 and she is needing a referral to [facility].

## 2021-03-30 NOTE — Addendum Note (Signed)
Addended by: Nilda Riggs on: 03/30/2021 02:44 PM   Modules accepted: Orders

## 2021-03-30 NOTE — Telephone Encounter (Signed)
Yes please do referral as requested

## 2021-03-30 NOTE — Telephone Encounter (Signed)
Referral to dermatology has been placed and the pt is aware that someone will be contacting her with appt information.

## 2021-04-13 ENCOUNTER — Other Ambulatory Visit (HOSPITAL_COMMUNITY): Payer: Self-pay

## 2021-04-13 MED FILL — Fluoxetine HCl Cap 40 MG: ORAL | 90 days supply | Qty: 90 | Fill #1 | Status: AC

## 2021-04-18 ENCOUNTER — Other Ambulatory Visit (HOSPITAL_COMMUNITY): Payer: Self-pay

## 2021-04-18 MED FILL — Gabapentin Cap 400 MG: ORAL | 90 days supply | Qty: 360 | Fill #1 | Status: AC

## 2021-04-25 ENCOUNTER — Telehealth: Payer: Self-pay | Admitting: Internal Medicine

## 2021-04-25 NOTE — Telephone Encounter (Signed)
Left message for patient to call back and schedule Medicare Annual Wellness Visit (AWV) either virtually or in office.   Jasmine Nelson [birth year] per [facility]   please schedule at anytime with [facility] Nurse Health Advisor 1 or 2   This should be a 45 minute visit.

## 2021-04-28 ENCOUNTER — Other Ambulatory Visit (HOSPITAL_COMMUNITY): Payer: Self-pay

## 2021-04-28 DIAGNOSIS — L853 Xerosis cutis: Secondary | ICD-10-CM | POA: Diagnosis not present

## 2021-04-28 DIAGNOSIS — L308 Other specified dermatitis: Secondary | ICD-10-CM | POA: Diagnosis not present

## 2021-04-28 IMAGING — MG MM DIGITAL DIAGNOSTIC UNILAT*L* W/ TOMO W/ CAD
8 series · 9 of 24 positions shown · non-contrast
Comparison: Previous exam(s).

CLINICAL DATA: 64-year-old female presenting as a recall from
screening for possible left breast asymmetries.

EXAM:
DIGITAL DIAGNOSTIC UNILATERAL LEFT MAMMOGRAM WITH TOMO AND CAD

[L CC synth-2D]
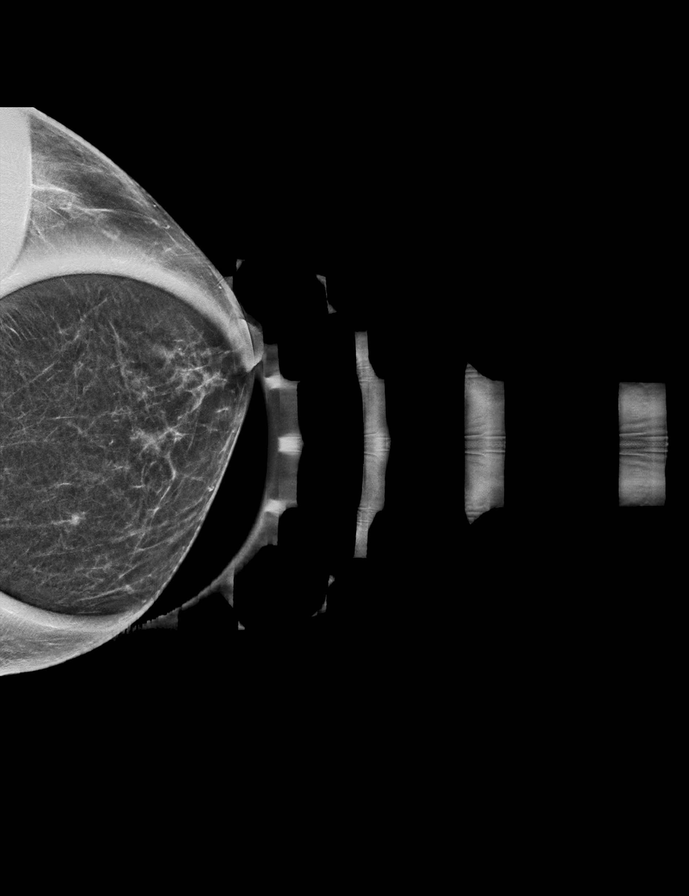

[L MLO synth-2D (1 of 2)]
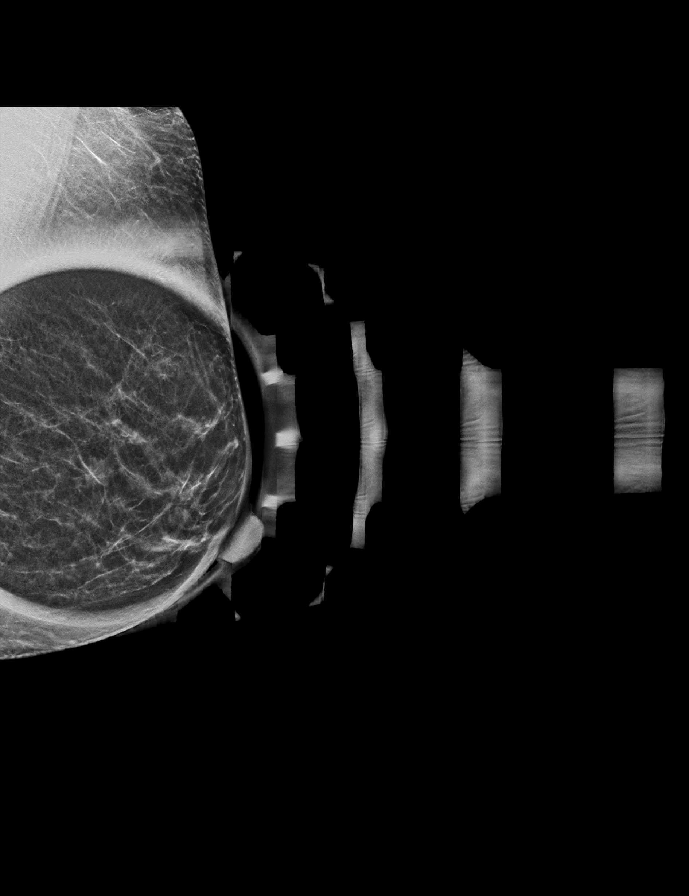

[L MLO synth-2D (2 of 2)]
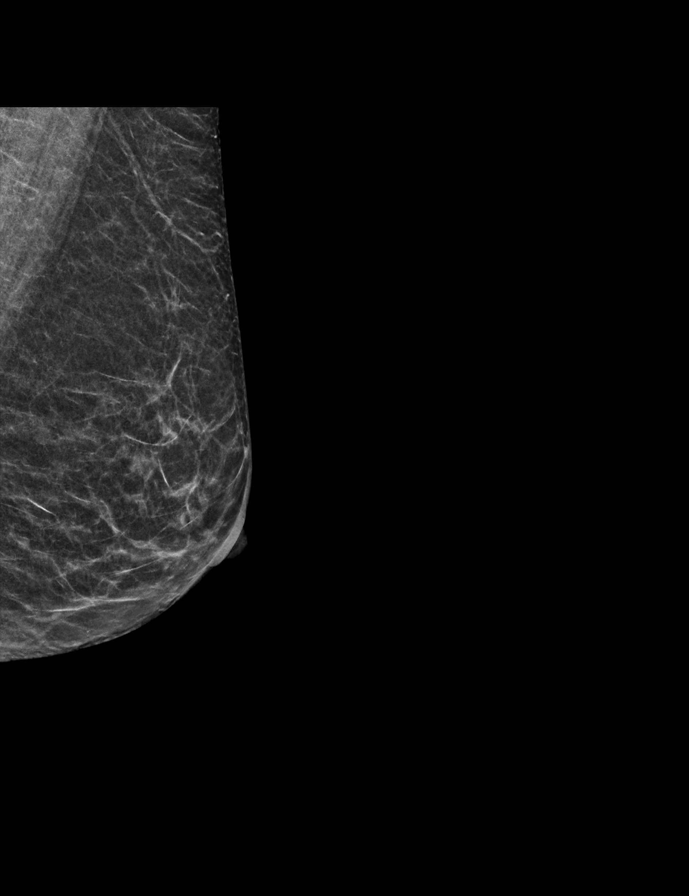

[L ML synth-2D]
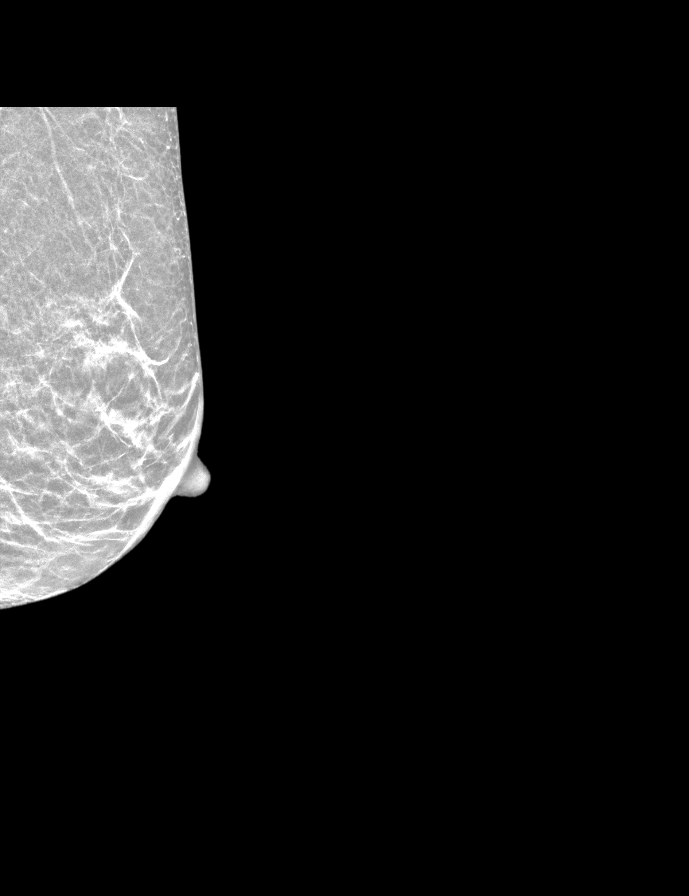

[L CC tomo · 2 of 44 frames shown]
[frame 15/44]
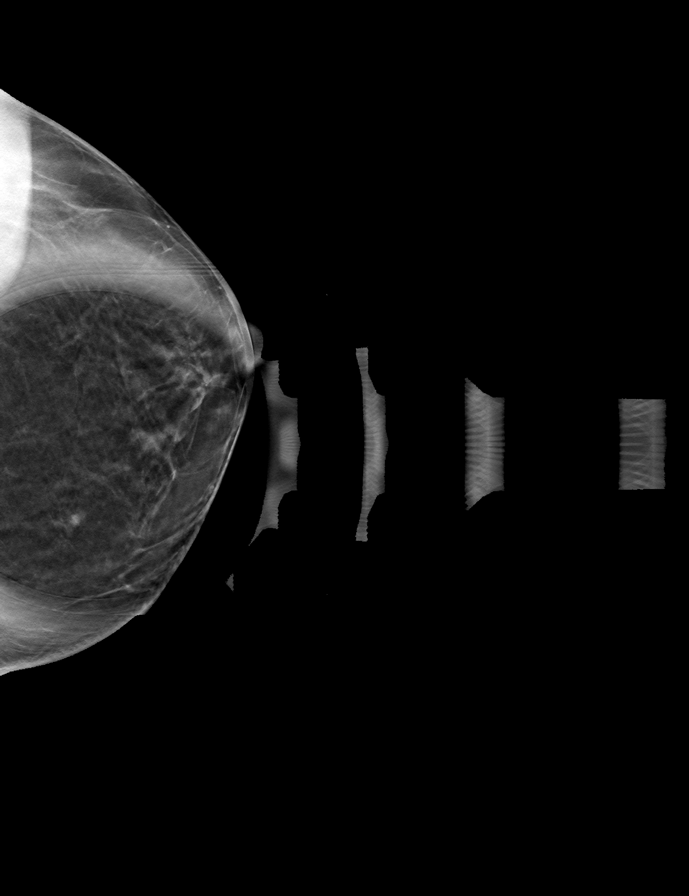
[frame 23/44]
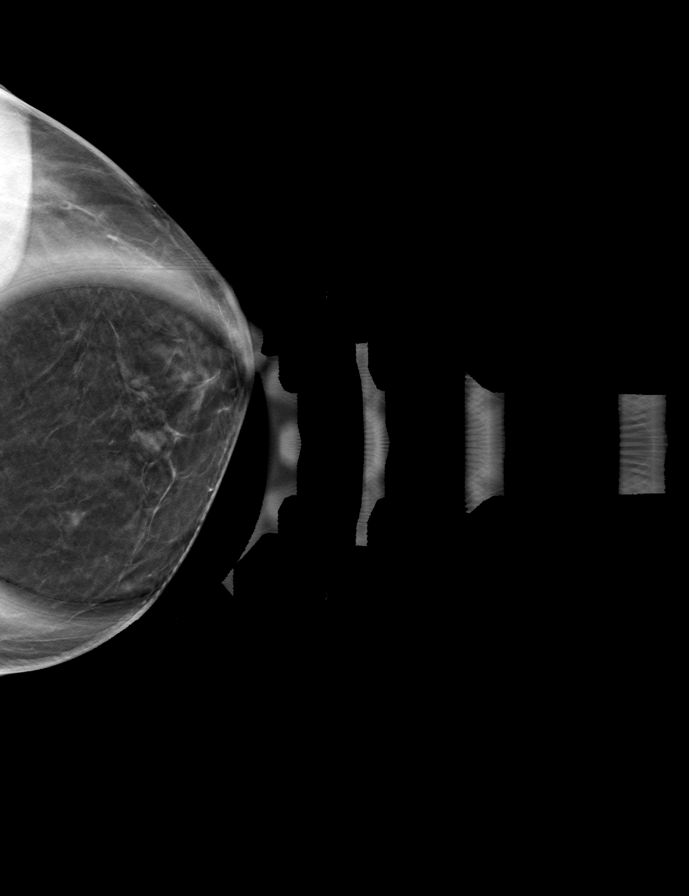

[L ML tomo · tomo slice 19/38.0]
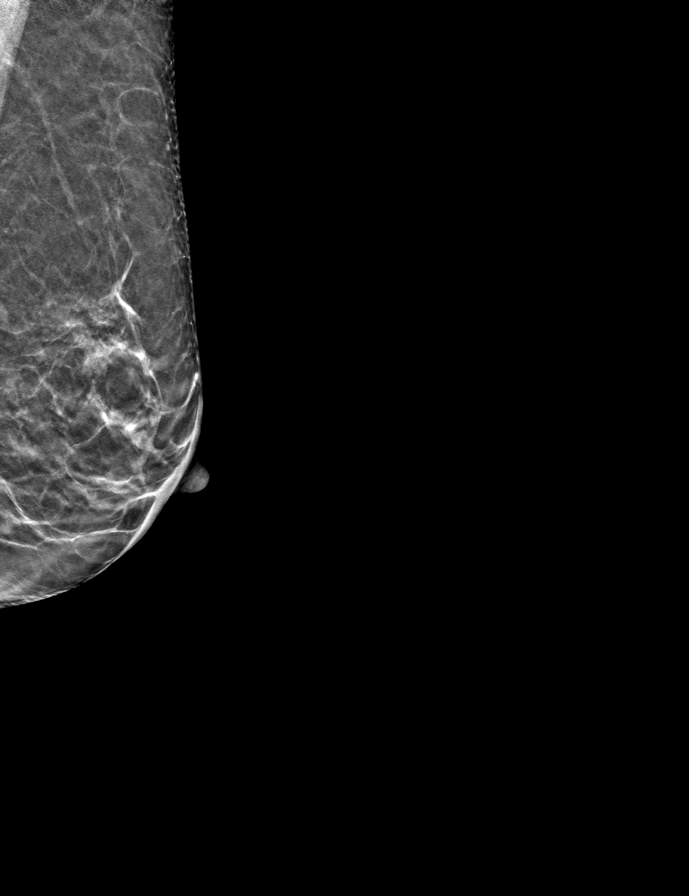

[L MLO tomo (1 of 2) · tomo slice 21/42.0]
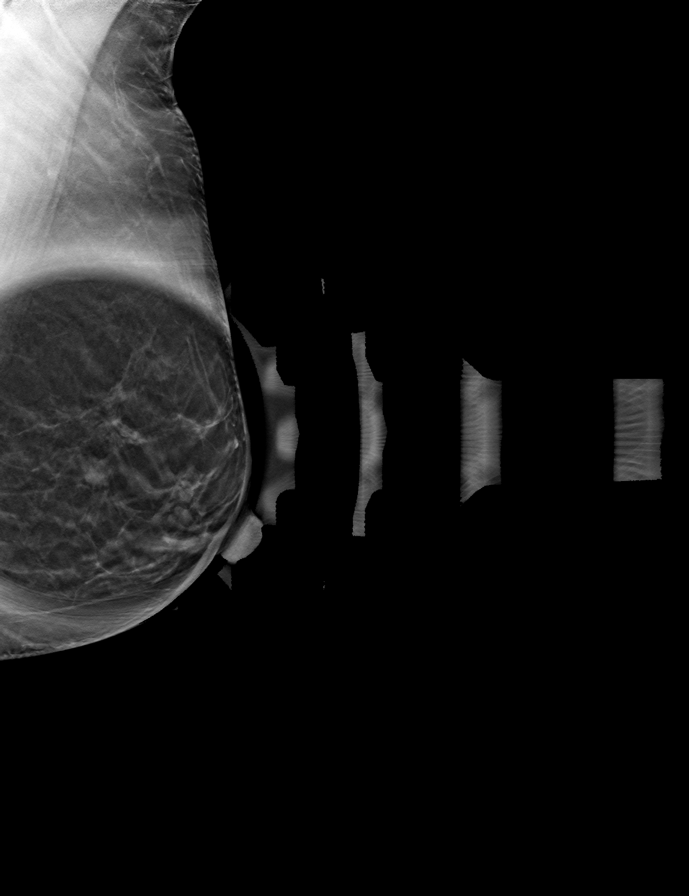

[L MLO tomo (2 of 2) · tomo slice 21/41.0]
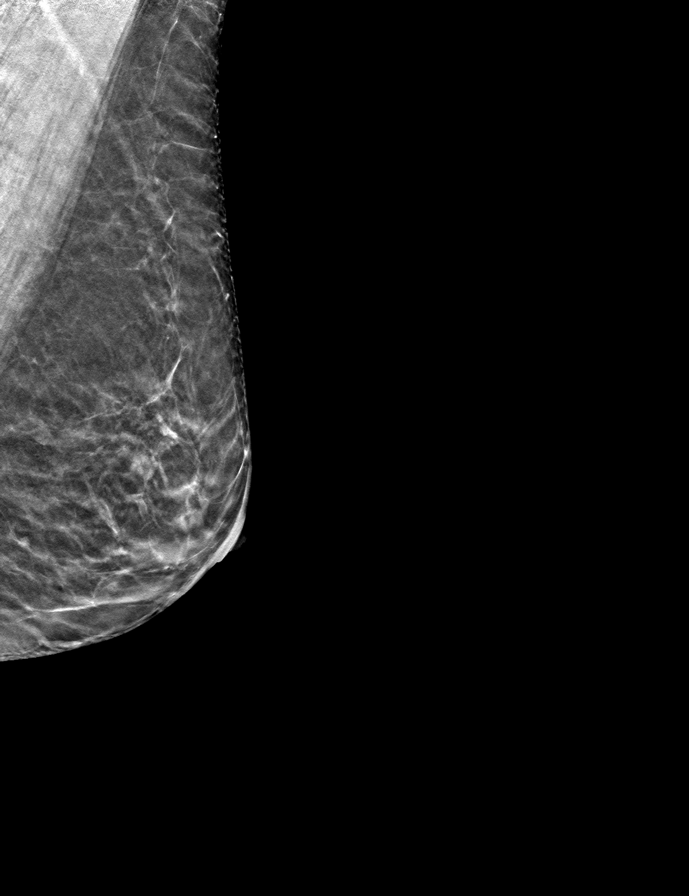

[9 of 24 positions shown; findings below may reference images not displayed]

ACR Breast Density Category b: There are scattered areas of
fibroglandular density.
FINDINGS: Spot compression tomosynthesis and full field ML and MLO
tomosynthesis views of the left breast performed for two questioned
asymmetries seen on screening mammogram in the medial left breast.
On the additional imaging the asymmetry more anteriorly demonstrates
stability dating back to at least December 2017, consistent with a
benign process. The smaller and more posterior asymmetry is less
conspicuous on the additional imaging with mammographic appearance
stable dating back multiple years. There are no new suspicious
findings in the medial left breast.
IMPRESSION: Benign asymmetries in the medial left breast. No mammographic
evidence of malignancy.

RECOMMENDATION:
Screening mammogram in one year.(Code:12-L-3XX)

I have discussed the findings and recommendations with the patient.
If applicable, a reminder letter will be sent to the patient
regarding the next appointment.

BI-RADS CATEGORY  2: Benign.

## 2021-04-28 MED ORDER — CLOBETASOL PROPIONATE 0.05 % EX CREA
TOPICAL_CREAM | CUTANEOUS | 2 refills | Status: DC
  Filled 2021-04-28: qty 45, 30d supply, fill #0
  Filled 2021-07-18: qty 45, 30d supply, fill #1
  Filled 2021-11-02: qty 45, 30d supply, fill #2

## 2021-05-02 ENCOUNTER — Other Ambulatory Visit: Payer: PPO

## 2021-05-06 ENCOUNTER — Other Ambulatory Visit (HOSPITAL_COMMUNITY): Payer: Self-pay

## 2021-05-06 MED ORDER — CLONIDINE HCL 0.1 MG PO TABS
ORAL_TABLET | ORAL | 0 refills | Status: DC
  Filled 2021-05-06: qty 90, 90d supply, fill #0

## 2021-05-10 ENCOUNTER — Other Ambulatory Visit (INDEPENDENT_AMBULATORY_CARE_PROVIDER_SITE_OTHER): Payer: PPO

## 2021-05-10 ENCOUNTER — Other Ambulatory Visit: Payer: Self-pay

## 2021-05-10 DIAGNOSIS — E559 Vitamin D deficiency, unspecified: Secondary | ICD-10-CM

## 2021-05-10 DIAGNOSIS — I10 Essential (primary) hypertension: Secondary | ICD-10-CM | POA: Diagnosis not present

## 2021-05-10 DIAGNOSIS — Z79899 Other long term (current) drug therapy: Secondary | ICD-10-CM

## 2021-05-10 DIAGNOSIS — R748 Abnormal levels of other serum enzymes: Secondary | ICD-10-CM

## 2021-05-10 LAB — CBC WITH DIFFERENTIAL/PLATELET
Basophils Absolute: 0.1 10*3/uL (ref 0.0–0.1)
Basophils Relative: 1 % (ref 0.0–3.0)
Eosinophils Absolute: 0.3 10*3/uL (ref 0.0–0.7)
Eosinophils Relative: 5 % (ref 0.0–5.0)
HCT: 40.1 % (ref 36.0–46.0)
Hemoglobin: 13.4 g/dL (ref 12.0–15.0)
Lymphocytes Relative: 30.7 % (ref 12.0–46.0)
Lymphs Abs: 2.1 10*3/uL (ref 0.7–4.0)
MCHC: 33.4 g/dL (ref 30.0–36.0)
MCV: 96.2 fl (ref 78.0–100.0)
Monocytes Absolute: 0.5 10*3/uL (ref 0.1–1.0)
Monocytes Relative: 7.8 % (ref 3.0–12.0)
Neutro Abs: 3.7 10*3/uL (ref 1.4–7.7)
Neutrophils Relative %: 55.5 % (ref 43.0–77.0)
Platelets: 304 10*3/uL (ref 150.0–400.0)
RBC: 4.16 Mil/uL (ref 3.87–5.11)
RDW: 14 % (ref 11.5–15.5)
WBC: 6.7 10*3/uL (ref 4.0–10.5)

## 2021-05-10 LAB — HEPATIC FUNCTION PANEL
ALT: 17 U/L (ref 0–35)
AST: 20 U/L (ref 0–37)
Albumin: 3.8 g/dL (ref 3.5–5.2)
Alkaline Phosphatase: 82 U/L (ref 39–117)
Bilirubin, Direct: 0.1 mg/dL (ref 0.0–0.3)
Total Bilirubin: 0.6 mg/dL (ref 0.2–1.2)
Total Protein: 6.4 g/dL (ref 6.0–8.3)

## 2021-05-10 LAB — BASIC METABOLIC PANEL
BUN: 25 mg/dL — ABNORMAL HIGH (ref 6–23)
CO2: 29 mEq/L (ref 19–32)
Calcium: 8.9 mg/dL (ref 8.4–10.5)
Chloride: 100 mEq/L (ref 96–112)
Creatinine, Ser: 0.86 mg/dL (ref 0.40–1.20)
GFR: 70.62 mL/min (ref >60.00)
Glucose, Bld: 115 mg/dL — ABNORMAL HIGH (ref 70–99)
Potassium: 4.1 mEq/L (ref 3.5–5.1)
Sodium: 137 mEq/L (ref 135–145)

## 2021-05-10 LAB — LIPID PANEL
Cholesterol: 208 mg/dL — ABNORMAL HIGH (ref 0–200)
HDL: 93.3 mg/dL (ref >39.00)
LDL Cholesterol: 96 mg/dL (ref 0–99)
NonHDL: 114.23
Total CHOL/HDL Ratio: 2
Triglycerides: 90 mg/dL (ref 0.0–149.0)
VLDL: 18 mg/dL (ref 0.0–40.0)

## 2021-05-10 LAB — VITAMIN D 25 HYDROXY (VIT D DEFICIENCY, FRACTURES): VITD: 29.34 ng/mL — ABNORMAL LOW (ref 30.00–100.00)

## 2021-05-10 LAB — TSH: TSH: 2.23 u[IU]/mL (ref 0.35–5.50)

## 2021-05-10 LAB — HEMOGLOBIN A1C: Hgb A1c MFr Bld: 5.7 % (ref 4.6–6.5)

## 2021-05-10 NOTE — Progress Notes (Signed)
Will discuss at upcoming visit.

## 2021-05-18 ENCOUNTER — Other Ambulatory Visit: Payer: Self-pay

## 2021-05-18 ENCOUNTER — Encounter: Payer: Self-pay | Admitting: Internal Medicine

## 2021-05-18 ENCOUNTER — Ambulatory Visit (INDEPENDENT_AMBULATORY_CARE_PROVIDER_SITE_OTHER): Payer: PPO

## 2021-05-18 ENCOUNTER — Ambulatory Visit (INDEPENDENT_AMBULATORY_CARE_PROVIDER_SITE_OTHER): Payer: PPO | Admitting: Internal Medicine

## 2021-05-18 VITALS — BP 120/78 | HR 81 | Temp 98.0°F | Ht 66.0 in | Wt 117.0 lb

## 2021-05-18 VITALS — BP 120/76 | HR 81 | Temp 99.0°F | Ht 66.0 in | Wt 117.4 lb

## 2021-05-18 DIAGNOSIS — R140 Abdominal distension (gaseous): Secondary | ICD-10-CM | POA: Diagnosis not present

## 2021-05-18 DIAGNOSIS — Z0000 Encounter for general adult medical examination without abnormal findings: Secondary | ICD-10-CM | POA: Diagnosis not present

## 2021-05-18 DIAGNOSIS — Z122 Encounter for screening for malignant neoplasm of respiratory organs: Secondary | ICD-10-CM | POA: Diagnosis not present

## 2021-05-18 DIAGNOSIS — Z79899 Other long term (current) drug therapy: Secondary | ICD-10-CM

## 2021-05-18 DIAGNOSIS — F17200 Nicotine dependence, unspecified, uncomplicated: Secondary | ICD-10-CM

## 2021-05-18 DIAGNOSIS — Z23 Encounter for immunization: Secondary | ICD-10-CM

## 2021-05-18 DIAGNOSIS — M25551 Pain in right hip: Secondary | ICD-10-CM

## 2021-05-18 DIAGNOSIS — E559 Vitamin D deficiency, unspecified: Secondary | ICD-10-CM

## 2021-05-18 DIAGNOSIS — R053 Chronic cough: Secondary | ICD-10-CM | POA: Diagnosis not present

## 2021-05-18 DIAGNOSIS — I10 Essential (primary) hypertension: Secondary | ICD-10-CM | POA: Diagnosis not present

## 2021-05-18 NOTE — Progress Notes (Signed)
Chief Complaint  Patient presents with   Follow-up    HPI: Jasmine Nelson 65 y.o. come in for follow-up medications and a multiple issues that need to be addressed  Bp   ok today is back  on med for a month or so   Derm referral asked     [place] [facility].  [facility] cream given that helped improve it over the next 3 to 4 days.  Has had a cough for a month or so denies any increasing shortness of breath but is concerned as she continues to smoke daily.  Has noted what she calls abdominal bloating does have a GYN check at the end of the year  GYN does DEXA she has osteoporosis not on medicine  Still see psychiatrist same medicines  Right hip is bothersome without injury laterally no fall.  He had possibly been set up for lung cancer screening but there were barriers and did not happen.  She is now on Medicare health team advantage.  ROS: See pertinent positives and negatives per HPI.  Past Medical History:  Diagnosis Date   Allergy    Anxiety    Cataract    both eyes   Chicken pox    COPD (chronic obstructive pulmonary disease) (HCC)    Depression    Glaucoma    Hamstring injury    torn   Hx of thyroid disease    3rd grade  resolved    Hypertension    Ingestion of substance    3 04   Osteoporosis    Shift work sleep disorder     Family History  Problem Relation Age of Onset   Cancer Mother 46       Died at 92 of Non-Hodgkins Lymphoma   Lymphoma Mother        1997   Colon polyps Mother        "many"; required regular colonoscopies   Alcohol abuse Father    COPD Father    Depression Father    Hyperlipidemia Father    Hypertension Father        deceased   Colon polyps Father        "many"; required regular colonoscopies   Colon cancer Sister        dx. 2-56   Other Sister        +CHEK2 mutation   COPD Maternal Aunt    Ovarian cancer Maternal Aunt 62   Stroke Maternal Grandmother    Heart Problems Maternal Grandmother    Cancer Maternal Grandfather 50        Brain Cancer   Arthritis Paternal Grandmother    Depression Paternal Grandmother    Glaucoma Paternal Grandmother    Breast cancer Paternal Grandmother        dx. 50s   Uterine cancer Paternal Grandmother        dx. 30s   Renal cancer Paternal Grandfather        smoker   Multiple myeloma Maternal Uncle        dx. well before his 49s   Ovarian cancer Other    Esophageal cancer Neg Hx    Stomach cancer Neg Hx    Rectal cancer Neg Hx     Social History   Socioeconomic History   Marital status: Married    Spouse name: Not on file   Number of children: Not on file   Years of education: Not on file   Highest education level: Not on file  Occupational History   Not on file  Tobacco Use   Smoking status: Every Day    Packs/day: 1.00    Types: Cigarettes   Smokeless tobacco: Never  Vaping Use   Vaping Use: Never used  Substance and Sexual Activity   Alcohol use: No   Drug use: No   Sexual activity: Not on file  Other Topics Concern   Not on file  Social History Narrative   Averages 5-8 hours of sleep per day.   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   P0G0      Social Determinants of Health   Financial Resource Strain: Low Risk    Difficulty of Paying Living Expenses: Not hard at all  Food Insecurity: No Food Insecurity   Worried About Charity fundraiser in the Last Year: Never true   [place] in the Last Year: Never true  Transportation Needs: No Transportation Needs   Lack of Transportation (Medical): No   Lack of Transportation (Non-Medical): No  Physical Activity: Inactive   Days of Exercise per Week: 0 days   Minutes of Exercise per Session: 0 min  Stress: No Stress Concern Present   Feeling of Stress : Not at all  Social Connections: Moderately Isolated   Frequency of Communication with Friends and  Family: Twice a week   Frequency of Social Gatherings with Friends and Family: Twice a week   Attends Religious Services: Never   Marine scientist or Organizations: No   Attends Music therapist: Never   Marital Status: Married    Outpatient Medications Prior to Visit  Medication Sig Dispense Refill   ADDERALL XR 30 MG 24 hr capsule      amphetamine-dextroamphetamine (ADDERALL) 20 MG tablet Take 20 mg by mouth daily.     amphetamine-dextroamphetamine (ADDERALL) 20 MG tablet Take one tablet by mouth in the morning and one tablet at noon 180 tablet 0   calcium citrate-vitamin D (CITRACAL+D) 315-200 MG-UNIT tablet Take 1 tablet by mouth 2 (two) times daily.     CHANTIX STARTING MONTH PAK 0.5 MG X 11 & 1 MG X 42 tablet      chlordiazePOXIDE (LIBRIUM) 5 MG capsule Take 5 mg by mouth 2 (two) times daily.   0   Cholecalciferol (VITAMIN D PO) Take by mouth.     clobetasol cream (TEMOVATE) 0.05 % Apply to the affected area twice daily as needed 45 g 2   cloNIDine (CATAPRES) 0.1 MG tablet TAKE ONE TABLET BY MOUTH AT BEDTIME 90 tablet 0   fluocinonide ointment (LIDEX) 0.73 % Apply 1 application topically 2 (two) times daily. Please mail to patient 30 g 1   FLUoxetine (PROZAC) 40 MG capsule fluoxetine 40 mg capsule     FLUoxetine (PROZAC) 40 MG capsule TAKE 1 CAPSULE BY MOUTH ONCE A DAY 90 capsule 4   gabapentin (NEURONTIN) 300 MG capsule Take 300 mg by mouth 3 (three) times daily.      gabapentin (NEURONTIN) 400 MG capsule TAKE 3-4 CAPSULES BY MOUTH EVERY EVENING 360 capsule 4   gabapentin (NEURONTIN) 400 MG capsule Take one capsule by mouth twice a day and four capsules at bedtime 540 capsule 4   hydrochlorothiazide (HYDRODIURIL) 12.5 MG tablet TAKE  1 TABLET BY MOUTH ONCE DAILY 90 tablet 0   Lactobacillus (PROBIOTIC ACIDOPHILUS PO) Take 1 capsule by mouth daily.     losartan (COZAAR) 100 MG tablet TAKE 1 TABLET BY MOUTH ONCE DAILY 90 tablet 0   RESTASIS 0.05 % ophthalmic emulsion       Vitamin D, Ergocalciferol, (DRISDOL) 1.25 MG (50000 UT) CAPS capsule Take 1 capsule (50,000 Units total) by mouth every 7 (seven) days. 12 capsule 0   vortioxetine HBr (TRINTELLIX) 20 MG TABS tablet Take 20 mg by mouth daily.     Zinc Sulfate (ZINC 15 PO) Take 1 tablet by mouth daily.     zolpidem (AMBIEN) 10 MG tablet Take 10 mg by mouth at bedtime as needed for sleep.     zolpidem (AMBIEN) 10 MG tablet TAKE 1 TABLET BY MOUTH EVERY EVENING AS NEEDED 90 tablet 0   zolpidem (AMBIEN) 10 MG tablet TAKE 1 TABLET BY MOUTH EVERY EVENING AT BEDTIME AS NEEDED. 90 tablet 0   Facility-Administered Medications Prior to Visit  Medication Dose Route Frequency Provider Last Rate Last Admin   0.9 %  sodium chloride infusion  500 mL Intravenous Once Nandigam, Kavitha V, MD         EXAM:  BP 120/76 (BP Location: Left Arm, Patient Position: Sitting, Cuff Size: Normal)   Pulse 81   Temp 99 F (37.2 C) (Oral)   Ht '5\' 6"'  (1.676 m)   Wt 117 lb 6.4 oz (53.3 kg)   SpO2 98%   BMI 18.95 kg/m   Body mass index is 18.95 kg/m. Wt Readings from Last 3 Encounters:  05/18/21 117 lb (53.1 kg)  05/18/21 117 lb 6.4 oz (53.3 kg)  01/02/20 113 lb 12.8 oz (51.6 kg)    GENERAL: vitals reviewed and listed above, alert, oriented, appears well hydrated and in no acute distress slender habitus HEENT: atraumatic, conjunctiva  clear, no obvious abnormalities on inspection of external nose and ears OP : masked  NECK: no obvious masses on inspection palpation  LUNGS: clear to auscultation bilaterally, no wheezes, rales or rhonchi, increased AP diameter breath sounds slightly decreased Abdomen soft without again a megaly guarding or rebound CV: HRRR, no clubbing cyanosis or  peripheral edema nl cap refill  MS: moves all extremities without noticeable focal  abnormality points to right lateral hip as discomfort but gait is normal. PSYCH: pleasant and cooperative, no obvious depression or anxiety Lab Results   Component Value Date   WBC 6.7 05/10/2021   HGB 13.4 05/10/2021   HCT 40.1 05/10/2021   PLT 304.0 05/10/2021   GLUCOSE 115 (H) 05/10/2021   CHOL 208 (H) 05/10/2021   TRIG 90.0 05/10/2021   HDL 93.30 05/10/2021   LDLCALC 96 05/10/2021   ALT 17 05/10/2021   AST 20 05/10/2021   NA 137 05/10/2021   K 4.1 05/10/2021   CL 100 05/10/2021   CREATININE 0.86 05/10/2021   BUN 25 (H) 05/10/2021   CO2 29 05/10/2021   TSH 2.23 05/10/2021   HGBA1C 5.7 05/10/2021   BP Readings from Last 3 Encounters:  05/18/21 120/78  05/18/21 120/76  01/02/20 138/84   Lab results reviewed with patient blood sugar was 115 but A1c was 5.7 without known hemolytic disease. ASSESSMENT AND PLAN:  Discussed the following assessment and plan:  Essential hypertension  Abdominal bloating - Plan: US Abdomen Complete  Cough, persistent - Plan: Ambulatory Referral Lung Cancer Screening [facility], Pulmonary Function Test  Tobacco dependence - Plan: Ambulatory Referral Lung  Cancer Screening [facility], Pulmonary Function Test  Right hip pain  Vitamin D deficiency  Medication management  Screening for lung cancer - Plan: Ambulatory Referral Lung Cancer Screening [facility]  Need for pneumococcal vaccination - Plan: Pneumococcal polysaccharide vaccine 23-valent greater than or equal to 2yo subcutaneous/IM Record review multiple issues addressed today and plan 45 minutes -Patient advised to return or notify health care team  if  new concerns arise.  Patient Instructions  We can order re referral for lung cancer screening   lung function tests.   .  Still stop tobacco . BP is good today continue meds  Take vit d 1000 u per day.   Vit d is borderline low   Hip may be bursitis. Follow up with your gyne  about bone health. Osteoporosis  Due for updated pneumovax. 23 Not sure why you have bloating We may order abd [place] . ( [facility]  ok )      Standley Brooking. Harbour Nordmeyer M.D.

## 2021-05-18 NOTE — Patient Instructions (Addendum)
We can order re referral for lung cancer screening   lung function tests.   .  Still stop tobacco . BP is good today continue meds  Take vit d 1000 u per day.   Vit d is borderline low   Hip may be bursitis. Follow up with your gyne  about bone health. Osteoporosis  Due for updated pneumovax. 23 Not sure why you have bloating We may order abd [place] . ( [facility]  ok )

## 2021-05-18 NOTE — Patient Instructions (Signed)
Jasmine Nelson , Thank you for taking time to come for your Medicare Wellness Visit. I appreciate your ongoing commitment to your health goals. Please review the following plan we discussed and let me know if I can assist you in the future.   Screening recommendations/referrals: Colonoscopy: 01/03/2018  due 2024 Mammogram: 08/2020  with obgyn  Bone Density: 12/27/2017 Recommended yearly ophthalmology/optometry visit for glaucoma screening and checkup Recommended yearly dental visit for hygiene and checkup  Vaccinations: Influenza vaccine: due fall 2022  Pneumococcal vaccine: completed series  Tdap vaccine: 11/08/2018 Shingles vaccine: completed series     Advanced directives: none   Conditions/risks identified: none   Next appointment: none    Preventive Care 66 Years and Older, Female Preventive care refers to lifestyle choices and visits with your health care provider that can promote health and wellness. What does preventive care include? A yearly physical exam. This is also called an annual well check. Dental exams once or twice a year. Routine eye exams. Ask your health care provider how often you should have your eyes checked. Personal lifestyle choices, including: Daily care of your teeth and gums. Regular physical activity. Eating a healthy diet. Avoiding tobacco and drug use. Limiting alcohol use. Practicing safe sex. Taking low-dose aspirin every day. Taking vitamin and mineral supplements as recommended by your health care provider. What happens during an annual well check? The services and screenings done by your health care provider during your annual well check will depend on your age, overall health, lifestyle risk factors, and family history of disease. Counseling  Your health care provider may ask you questions about your: Alcohol use. Tobacco use. Drug use. Emotional well-being. Home and relationship well-being. Sexual activity. Eating habits. History of  falls. Memory and ability to understand (cognition). Work and work Statistician. Reproductive health. Screening  You may have the following tests or measurements: Height, weight, and BMI. Blood pressure. Lipid and cholesterol levels. These may be checked every 5 years, or more frequently if you are over 5 years old. Skin check. Lung cancer screening. You may have this screening every year starting at age 5 if you have a 30-pack-year history of smoking and currently smoke or have quit within the past 15 years. Fecal occult blood test (FOBT) of the stool. You may have this test every year starting at age 36. Flexible sigmoidoscopy or colonoscopy. You may have a sigmoidoscopy every 5 years or a colonoscopy every 10 years starting at age 40. Hepatitis C blood test. Hepatitis B blood test. Sexually transmitted disease (STD) testing. Diabetes screening. This is done by checking your blood sugar (glucose) after you have not eaten for a while (fasting). You may have this done every 1-3 years. Bone density scan. This is done to screen for osteoporosis. You may have this done starting at age 82. Mammogram. This may be done every 1-2 years. Talk to your health care provider about how often you should have regular mammograms. Talk with your health care provider about your test results, treatment options, and if necessary, the need for more tests. Vaccines  Your health care provider may recommend certain vaccines, such as: Influenza vaccine. This is recommended every year. Tetanus, diphtheria, and acellular pertussis (Tdap, Td) vaccine. You may need a Td booster every 10 years. Zoster vaccine. You may need this after age 56. Pneumococcal 13-valent conjugate (PCV13) vaccine. One dose is recommended after age 24. Pneumococcal polysaccharide (PPSV23) vaccine. One dose is recommended after age 78. Talk to your health care provider  about which screenings and vaccines you need and how often you need  them. This information is not intended to replace advice given to you by your health care provider. Make sure you discuss any questions you have with your health care provider. Document Released: 10/08/2015 Document Revised: 05/31/2016 Document Reviewed: 07/13/2015 Elsevier Interactive Patient Education  2017 [place] Prevention in the Home Falls can cause injuries. They can happen to people of all ages. There are many things you can do to make your home safe and to help prevent falls. What can I do on the outside of my home? Regularly fix the edges of walkways and driveways and fix any cracks. Remove anything that might make you trip as you walk through a door, such as a raised step or threshold. Trim any bushes or trees on the path to your home. Use bright outdoor lighting. Clear any walking paths of anything that might make someone trip, such as rocks or tools. Regularly check to see if handrails are loose or broken. Make sure that both sides of any steps have handrails. Any raised decks and porches should have guardrails on the edges. Have any leaves, snow, or ice cleared regularly. Use sand or salt on walking paths during winter. Clean up any spills in your garage right away. This includes oil or grease spills. What can I do in the bathroom? Use night lights. Install grab bars by the toilet and in the tub and shower. Do not use towel bars as grab bars. Use non-skid mats or decals in the tub or shower. If you need to sit down in the shower, use a plastic, non-slip stool. Keep the floor dry. Clean up any water that spills on the floor as soon as it happens. Remove soap buildup in the tub or shower regularly. Attach bath mats securely with double-sided non-slip rug tape. Do not have throw rugs and other things on the floor that can make you trip. What can I do in the bedroom? Use night lights. Make sure that you have a light by your bed that is easy to reach. Do not use  any sheets or blankets that are too big for your bed. They should not hang down onto the floor. Have a firm chair that has side arms. You can use this for support while you get dressed. Do not have throw rugs and other things on the floor that can make you trip. What can I do in the kitchen? Clean up any spills right away. Avoid walking on wet floors. Keep items that you use a lot in easy-to-reach places. If you need to reach something above you, use a strong step stool that has a grab bar. Keep electrical cords out of the way. Do not use floor polish or wax that makes floors slippery. If you must use wax, use non-skid floor wax. Do not have throw rugs and other things on the floor that can make you trip. What can I do with my stairs? Do not leave any items on the stairs. Make sure that there are handrails on both sides of the stairs and use them. Fix handrails that are broken or loose. Make sure that handrails are as long as the stairways. Check any carpeting to make sure that it is firmly attached to the stairs. Fix any carpet that is loose or worn. Avoid having throw rugs at the top or bottom of the stairs. If you do have throw rugs, attach them to the floor  with carpet tape. Make sure that you have a light switch at the top of the stairs and the bottom of the stairs. If you do not have them, ask someone to add them for you. What else can I do to help prevent falls? Wear shoes that: Do not have high heels. Have rubber bottoms. Are comfortable and fit you well. Are closed at the toe. Do not wear sandals. If you use a stepladder: Make sure that it is fully opened. Do not climb a closed stepladder. Make sure that both sides of the stepladder are locked into place. Ask someone to hold it for you, if possible. Clearly mark and make sure that you can see: Any grab bars or handrails. First and last steps. Where the edge of each step is. Use tools that help you move around (mobility aids)  if they are needed. These include: Canes. Walkers. Scooters. Crutches. Turn on the lights when you go into a dark area. Replace any light bulbs as soon as they burn out. Set up your furniture so you have a clear path. Avoid moving your furniture around. If any of your floors are uneven, fix them. If there are any pets around you, be aware of where they are. Review your medicines with your doctor. Some medicines can make you feel dizzy. This can increase your chance of falling. Ask your doctor what other things that you can do to help prevent falls. This information is not intended to replace advice given to you by your health care provider. Make sure you discuss any questions you have with your health care provider. Document Released: 07/08/2009 Document Revised: 02/17/2016 Document Reviewed: 10/16/2014 Elsevier Interactive Patient Education  2017 [employer].

## 2021-05-18 NOTE — Progress Notes (Signed)
Subjective:   Jasmine Nelson is a 65 y.o. female who presents for an Initial Medicare Annual Wellness Visit.  Review of Systems    N/a       Objective:    There were no vitals filed for this visit. There is no height or weight on file to calculate BMI.  Advanced Directives 01/03/2018  Does Patient Have a Medical Advance Directive? No  Would patient like information on creating a medical advance directive? No - Patient declined    Current Medications (verified) Outpatient Encounter Medications as of 05/18/2021  Medication Sig   ADDERALL XR 30 MG 24 hr capsule    amphetamine-dextroamphetamine (ADDERALL) 20 MG tablet Take 20 mg by mouth daily.   amphetamine-dextroamphetamine (ADDERALL) 20 MG tablet Take one tablet by mouth in the morning and one tablet at noon   calcium citrate-vitamin D (CITRACAL+D) 315-200 MG-UNIT tablet Take 1 tablet by mouth 2 (two) times daily.   CHANTIX STARTING MONTH PAK 0.5 MG X 11 & 1 MG X 42 tablet    chlordiazePOXIDE (LIBRIUM) 5 MG capsule Take 5 mg by mouth 2 (two) times daily.    Cholecalciferol (VITAMIN D PO) Take by mouth.   clobetasol cream (TEMOVATE) 0.05 % Apply to the affected area twice daily as needed   cloNIDine (CATAPRES) 0.1 MG tablet TAKE ONE TABLET BY MOUTH AT BEDTIME   fluocinonide ointment (LIDEX) 8.55 % Apply 1 application topically 2 (two) times daily. Please mail to patient   FLUoxetine (PROZAC) 40 MG capsule fluoxetine 40 mg capsule   FLUoxetine (PROZAC) 40 MG capsule TAKE 1 CAPSULE BY MOUTH ONCE A DAY   gabapentin (NEURONTIN) 300 MG capsule Take 300 mg by mouth 3 (three) times daily.    gabapentin (NEURONTIN) 400 MG capsule TAKE 3-4 CAPSULES BY MOUTH EVERY EVENING   gabapentin (NEURONTIN) 400 MG capsule Take one capsule by mouth twice a day and four capsules at bedtime   hydrochlorothiazide (HYDRODIURIL) 12.5 MG tablet TAKE 1 TABLET BY MOUTH ONCE DAILY   Lactobacillus (PROBIOTIC ACIDOPHILUS PO) Take 1 capsule by mouth daily.    losartan (COZAAR) 100 MG tablet TAKE 1 TABLET BY MOUTH ONCE DAILY   RESTASIS 0.05 % ophthalmic emulsion    Vitamin D, Ergocalciferol, (DRISDOL) 1.25 MG (50000 UT) CAPS capsule Take 1 capsule (50,000 Units total) by mouth every 7 (seven) days.   vortioxetine HBr (TRINTELLIX) 20 MG TABS tablet Take 20 mg by mouth daily.   Zinc Sulfate (ZINC 15 PO) Take 1 tablet by mouth daily.   zolpidem (AMBIEN) 10 MG tablet Take 10 mg by mouth at bedtime as needed for sleep.   zolpidem (AMBIEN) 10 MG tablet TAKE 1 TABLET BY MOUTH EVERY EVENING AS NEEDED   zolpidem (AMBIEN) 10 MG tablet TAKE 1 TABLET BY MOUTH EVERY EVENING AT BEDTIME AS NEEDED.   Facility-Administered Encounter Medications as of 05/18/2021  Medication   0.9 %  sodium chloride infusion    Allergies (verified) Lisinopril   History: Past Medical History:  Diagnosis Date   Allergy    Anxiety    Cataract    both eyes   Chicken pox    COPD (chronic obstructive pulmonary disease) (HCC)    Depression    Glaucoma    Hamstring injury    torn   Hx of thyroid disease    3rd grade  resolved    Hypertension    Ingestion of substance    3 04   Osteoporosis    Shift work sleep  disorder    Past Surgical History:  Procedure Laterality Date   ADENOIDECTOMY  1959   BREAST BIOPSY Right 2019   COLONOSCOPY     Family History  Problem Relation Age of Onset   Cancer Mother 66       Died at 76 of Non-Hodgkins Lymphoma   Lymphoma Mother        48   Colon polyps Mother        "many"; required regular colonoscopies   Alcohol abuse Father    COPD Father    Depression Father    Hyperlipidemia Father    Hypertension Father        deceased   Colon polyps Father        "many"; required regular colonoscopies   Colon cancer Sister        dx. 41-56   Other Sister        +CHEK2 mutation   COPD Maternal Aunt    Ovarian cancer Maternal Aunt 62   Stroke Maternal Grandmother    Heart Problems Maternal Grandmother    Cancer Maternal  Grandfather 51       Brain Cancer   Arthritis Paternal Grandmother    Depression Paternal Grandmother    Glaucoma Paternal Grandmother    Breast cancer Paternal Grandmother        dx. 50s   Uterine cancer Paternal Grandmother        dx. 38s   Renal cancer Paternal Grandfather        smoker   Multiple myeloma Maternal Uncle        dx. well before his 80s   Ovarian cancer Other    Esophageal cancer Neg Hx    Stomach cancer Neg Hx    Rectal cancer Neg Hx    Social History   Socioeconomic History   Marital status: Married    Spouse name: Not on file   Number of children: Not on file   Years of education: Not on file   Highest education level: Not on file  Occupational History   Not on file  Tobacco Use   Smoking status: Every Day    Packs/day: 1.00    Types: Cigarettes   Smokeless tobacco: Never  Vaping Use   Vaping Use: Never used  Substance and Sexual Activity   Alcohol use: No   Drug use: No   Sexual activity: Not on file  Other Topics Concern   Not on file  Social History Narrative   Averages 5-8 hours of sleep per day.   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   [id number]      Social Determinants of Health   Financial Resource Strain: Not on file  Food Insecurity: Not on file  Transportation Needs: Not on file  Physical Activity: Not on file  Stress: Not on file  Social Connections: Not on file    Tobacco Counseling Ready to quit: Not Answered Counseling given: Not Answered   Clinical Intake:                 Diabetic?no         Activities of Daily Living No flowsheet data found.  Patient Care Team: Burnis Medin, MD as PCP - Lillia Pauls, MD  as Consulting Physician (Ophthalmology) Chucky May, MD as Consulting Physician (Psychiatry)  Indicate any recent Medical Services  you may have received from other than [facility] providers in the past year (date may be approximate).     Assessment:   This is a routine wellness examination for [facility].  Hearing/Vision screen No results found.  Dietary issues and exercise activities discussed:     Goals Addressed   None    Depression Screen PHQ 2/9 Scores 11/08/2018  PHQ - 2 Score 0    Fall Risk No flowsheet data found.  FALL RISK PREVENTION PERTAINING TO THE HOME:  Any stairs in or around the home? Yes  If so, are there any without handrails? Yes  Home free of loose throw rugs in walkways, pet beds, electrical cords, etc? Yes  Adequate lighting in your home to reduce risk of falls? Yes   ASSISTIVE DEVICES UTILIZED TO PREVENT FALLS:  Life alert? No  Use of a cane, walker or w/c? No  Grab bars in the bathroom? No  Shower chair or bench in shower? Yes  Elevated toilet seat or a handicapped toilet? Yes   TIMED UP AND GO:  Was the test performed? Yes .  Length of time to ambulate 10 feet: 10 sec.   Gait steady and fast without use of assistive device  Cognitive Function:    Normal cognitive status assessed by direct observation by this Nurse Health Advisor. No abnormalities found.      Immunizations Immunization History  Administered Date(s) Administered   Influenza Whole 07/09/2009   Moderna SARS-COV2 Booster Vaccination 08/09/2020   Moderna Sars-Covid-2 Vaccination 12/18/2019, 01/14/2020   PFIZER(Purple Top)SARS-COV-2 Vaccination 03/25/2021   Pneumococcal Conjugate-13 10/31/2013   Pneumococcal Polysaccharide-23 01/22/2015   Td 11/08/2018   Zoster Recombinat (Shingrix) 11/08/2018, 06/20/2019    TDAP status: Up to date  Flu Vaccine status: Up to date  Pneumococcal vaccine status: Up to date  Covid-19 vaccine status: Completed vaccines  Qualifies for Shingles Vaccine? Yes   Zostavax completed Yes   Shingrix Completed?: Yes  Screening Tests Health Maintenance  Topic Date Due   FOOT  EXAM  Never done   OPHTHALMOLOGY EXAM  Never done   MAMMOGRAM  05/27/2017   PAP SMEAR-Modifier  12/24/2017   PNA vac Low Risk Adult (2 of 2 - PPSV23) 05/24/2020   INFLUENZA VACCINE  04/25/2021   COVID-19 Vaccine (5 - Booster for Moderna series) 07/26/2021   HEMOGLOBIN A1C  11/10/2021   COLONOSCOPY (Pts 45-22yr Insurance coverage will need to be confirmed)  01/04/2023   TETANUS/TDAP  11/08/2028   DEXA SCAN  Completed   Hepatitis C Screening  Completed   HIV Screening  Completed   Zoster Vaccines- Shingrix  Completed   HPV VACCINES  Aged Out    Health Maintenance  Health Maintenance Due  Topic Date Due   FOOT EXAM  Never done   OPHTHALMOLOGY EXAM  Never done   MAMMOGRAM  05/27/2017   PAP SMEAR-Modifier  12/24/2017   PNA vac Low Risk Adult (2 of 2 - PPSV23) 05/24/2020   INFLUENZA VACCINE  04/25/2021    Colorectal cancer screening: Type of screening: Colonoscopy. Completed 01/03/2018. Repeat every 5 years  Mammogram status: Completed 08/2020. Repeat every year  Bone Density status: Completed 12/27/2017. Results reflect: Bone density results: NORMAL. Repeat every 5 years.  Lung Cancer Screening: (Low Dose CT Chest recommended if Age 66-80years, 30 pack-year currently smoking OR have quit w/in 15years.) does qualify.   Lung Cancer Screening  Referral: completed 05/18/2021 Dr.panosh   Additional Screening:  Hepatitis C Screening: does not qualify; Completed 07/02/2015  Vision Screening: Recommended annual ophthalmology exams for early detection of glaucoma and other disorders of the eye. Is the patient up to date with their annual eye exam?  Yes  Who is the provider or what is the name of the office in which the patient attends annual eye exams? Dr.groat  If pt is not established with a provider, would they like to be referred to a provider to establish care? No .   Dental Screening: Recommended annual dental exams for proper oral hygiene  Community Resource Referral /  Chronic Care Management: CRR required this visit?  No   CCM required this visit?  No      Plan:     I have personally reviewed and noted the following in the patient's chart:   Medical and social history Use of alcohol, tobacco or illicit drugs  Current medications and supplements including opioid prescriptions. Patient is not currently taking opioid prescriptions. Functional ability and status Nutritional status Physical activity Advanced directives List of other physicians Hospitalizations, surgeries, and ER visits in previous 12 months Vitals Screenings to include cognitive, depression, and falls Referrals and appointments  In addition, I have reviewed and discussed with patient certain preventive protocols, quality metrics, and best practice recommendations. A written personalized care plan for preventive services as well as general preventive health recommendations were provided to patient.     Randel Pigg, LPN   12/19/6145   Nurse Notes: none

## 2021-05-23 ENCOUNTER — Telehealth: Payer: Self-pay | Admitting: Internal Medicine

## 2021-05-23 DIAGNOSIS — R140 Abdominal distension (gaseous): Secondary | ICD-10-CM

## 2021-05-23 NOTE — Telephone Encounter (Signed)
Patient called because she needs an abdominal and pelvic ultrasound but she is only referred for an abdominal ultrasound. Wants to speak with [facility] to be sure everything is correct. If cannot call back please check to be sure order is in correctly at [facility].  Please Advise  Good callback number is [phone]

## 2021-05-23 NOTE — Telephone Encounter (Signed)
Pt has been informed that imaging test has been ordered and someone will be reaching out to with appointment information.

## 2021-06-03 ENCOUNTER — Ambulatory Visit (HOSPITAL_BASED_OUTPATIENT_CLINIC_OR_DEPARTMENT_OTHER)
Admission: RE | Admit: 2021-06-03 | Discharge: 2021-06-03 | Disposition: A | Discharge status: Home / Self Care | Payer: PPO | Source: Ambulatory Visit | Attending: Internal Medicine | Admitting: Internal Medicine

## 2021-06-03 ENCOUNTER — Ambulatory Visit (HOSPITAL_BASED_OUTPATIENT_CLINIC_OR_DEPARTMENT_OTHER): Payer: PPO

## 2021-06-03 ENCOUNTER — Other Ambulatory Visit: Payer: Self-pay

## 2021-06-03 DIAGNOSIS — R140 Abdominal distension (gaseous): Secondary | ICD-10-CM

## 2021-06-03 DIAGNOSIS — R197 Diarrhea, unspecified: Secondary | ICD-10-CM | POA: Diagnosis not present

## 2021-06-03 DIAGNOSIS — N3289 Other specified disorders of bladder: Secondary | ICD-10-CM | POA: Diagnosis not present

## 2021-06-03 DIAGNOSIS — Z780 Asymptomatic menopausal state: Secondary | ICD-10-CM | POA: Diagnosis not present

## 2021-06-04 NOTE — Progress Notes (Signed)
No major abnormalities  ovaries normal . Not sure what "debri in bladder "means  esp iif not having  any urinary symptoms  sx.  Maybe follow up with [facility] also . And get a urinalysis .here or at [facility].

## 2021-06-07 ENCOUNTER — Telehealth: Payer: Self-pay

## 2021-06-07 NOTE — Progress Notes (Signed)
Normal abdominal US

## 2021-06-07 NOTE — Telephone Encounter (Signed)
Patient returned call and stated that she reviewed [place] results on Mychart and that it is ok to leave message on voice mail or in [place].

## 2021-06-07 NOTE — Telephone Encounter (Signed)
Result have been reviewed with the pt

## 2021-06-12 ENCOUNTER — Other Ambulatory Visit: Payer: Self-pay | Admitting: Internal Medicine

## 2021-06-13 ENCOUNTER — Other Ambulatory Visit (HOSPITAL_COMMUNITY): Payer: Self-pay

## 2021-06-13 MED ORDER — LOSARTAN POTASSIUM 100 MG PO TABS
ORAL_TABLET | Freq: Every day | ORAL | 0 refills | Status: DC
  Filled 2021-06-13: qty 90, 90d supply, fill #0

## 2021-06-28 ENCOUNTER — Other Ambulatory Visit (HOSPITAL_COMMUNITY): Payer: Self-pay

## 2021-06-28 ENCOUNTER — Other Ambulatory Visit: Payer: Self-pay | Admitting: Internal Medicine

## 2021-06-30 ENCOUNTER — Other Ambulatory Visit (HOSPITAL_COMMUNITY): Payer: Self-pay

## 2021-06-30 MED ORDER — HYDROCHLOROTHIAZIDE 12.5 MG PO TABS
ORAL_TABLET | Freq: Every day | ORAL | 0 refills | Status: DC
  Filled 2021-06-30: qty 90, 90d supply, fill #0

## 2021-07-07 MED FILL — Fluoxetine HCl Cap 40 MG: ORAL | 90 days supply | Qty: 90 | Fill #2 | Status: AC

## 2021-07-08 ENCOUNTER — Other Ambulatory Visit (HOSPITAL_COMMUNITY): Payer: Self-pay

## 2021-07-12 DIAGNOSIS — Z23 Encounter for immunization: Secondary | ICD-10-CM | POA: Diagnosis not present

## 2021-07-18 ENCOUNTER — Other Ambulatory Visit (HOSPITAL_COMMUNITY): Payer: Self-pay

## 2021-07-26 ENCOUNTER — Other Ambulatory Visit: Payer: Self-pay | Admitting: *Deleted

## 2021-07-26 DIAGNOSIS — Z87891 Personal history of nicotine dependence: Secondary | ICD-10-CM

## 2021-07-26 DIAGNOSIS — F17210 Nicotine dependence, cigarettes, uncomplicated: Secondary | ICD-10-CM

## 2021-08-02 ENCOUNTER — Other Ambulatory Visit (HOSPITAL_COMMUNITY): Payer: Self-pay

## 2021-08-02 DIAGNOSIS — Z961 Presence of intraocular lens: Secondary | ICD-10-CM | POA: Diagnosis not present

## 2021-08-02 DIAGNOSIS — H31091 Other chorioretinal scars, right eye: Secondary | ICD-10-CM | POA: Diagnosis not present

## 2021-08-02 DIAGNOSIS — H16223 Keratoconjunctivitis sicca, not specified as Sjogren's, bilateral: Secondary | ICD-10-CM | POA: Diagnosis not present

## 2021-08-02 DIAGNOSIS — H3561 Retinal hemorrhage, right eye: Secondary | ICD-10-CM | POA: Diagnosis not present

## 2021-08-02 MED ORDER — CEQUA 0.09 % OP SOLN
1.0000 [drp] | Freq: Two times a day (BID) | OPHTHALMIC | 3 refills | Status: DC
  Filled 2021-08-02: qty 180, 90d supply, fill #0

## 2021-08-03 ENCOUNTER — Other Ambulatory Visit (HOSPITAL_COMMUNITY): Payer: Self-pay

## 2021-08-03 MED ORDER — CLONIDINE HCL 0.1 MG PO TABS
0.1000 mg | ORAL_TABLET | Freq: Every day | ORAL | 0 refills | Status: DC
  Filled 2021-08-03: qty 90, 90d supply, fill #0

## 2021-08-04 ENCOUNTER — Other Ambulatory Visit (HOSPITAL_COMMUNITY): Payer: Self-pay

## 2021-08-15 DIAGNOSIS — G4700 Insomnia, unspecified: Secondary | ICD-10-CM | POA: Diagnosis not present

## 2021-08-15 DIAGNOSIS — F3342 Major depressive disorder, recurrent, in full remission: Secondary | ICD-10-CM | POA: Diagnosis not present

## 2021-08-16 ENCOUNTER — Other Ambulatory Visit (HOSPITAL_COMMUNITY): Payer: Self-pay

## 2021-08-16 MED ORDER — BELSOMRA 15 MG PO TABS
ORAL_TABLET | ORAL | 1 refills | Status: DC
  Filled 2021-08-16: qty 90, 90d supply, fill #0
  Filled 2021-11-15: qty 90, 90d supply, fill #1

## 2021-08-16 MED ORDER — CLONIDINE HCL 0.1 MG PO TABS
ORAL_TABLET | ORAL | 1 refills | Status: DC
  Filled 2021-08-16 – 2021-09-12 (×2): qty 270, 90d supply, fill #0
  Filled 2021-11-22: qty 270, 90d supply, fill #1

## 2021-08-17 ENCOUNTER — Other Ambulatory Visit (HOSPITAL_COMMUNITY): Payer: Self-pay

## 2021-08-22 ENCOUNTER — Other Ambulatory Visit: Payer: Self-pay

## 2021-08-22 ENCOUNTER — Encounter: Payer: Self-pay | Admitting: Acute Care

## 2021-08-22 ENCOUNTER — Telehealth (INDEPENDENT_AMBULATORY_CARE_PROVIDER_SITE_OTHER): Payer: PPO | Admitting: Acute Care

## 2021-08-22 DIAGNOSIS — F17210 Nicotine dependence, cigarettes, uncomplicated: Secondary | ICD-10-CM | POA: Diagnosis not present

## 2021-08-22 NOTE — Progress Notes (Signed)
Virtual Visit via Video Note  I connected with Jasmine Nelson on 08/22/21 at  3:30 PM EST by a video enabled telemedicine application and verified that I am speaking with the correct person using two identifiers.  Location: Patient: At home Provider:  [place], [place], [place], [address]    I discussed the limitations of evaluation and management by telemedicine and the availability of in person appointments. The patient expressed understanding and agreed to proceed.  Shared Decision Making Visit Lung Cancer Screening Program [phone])   Eligibility: Age 65 y.o. Pack Years Smoking History Calculation 25 pack year smoking history (# packs/per year x # years smoked) Recent History of coughing up blood  no Unexplained weight loss? no ( >Than 15 pounds within the last 6 months ) Prior History Lung / other cancer no (Diagnosis within the last 5 years already requiring surveillance chest CT Scans). Smoking Status Current Smoker Former Smokers: NA   Quit Date: NA  Visit Components: Discussion included one or more decision making aids. yes Discussion included risk/benefits of screening. yes Discussion included potential follow up diagnostic testing for abnormal scans. yes Discussion included meaning and risk of over diagnosis. yes Discussion included meaning and risk of False Positives. yes Discussion included meaning of total radiation exposure. yes  Counseling Included: Importance of adherence to annual lung cancer LDCT screening. yes Impact of comorbidities on ability to participate in the program. yes Ability and willingness to under diagnostic treatment. yes  Smoking Cessation Counseling: Current Smokers:  Discussed importance of smoking cessation. yes Information about tobacco cessation classes and interventions provided to patient. yes Patient provided with "ticket" for LDCT Scan. yes Symptomatic Patient. no  Counseling NA Diagnosis Code: Tobacco Use  Z72.0 Asymptomatic Patient yes  Counseling (Intermediate counseling: > three minutes counseling) [id number] Former Smokers:  Discussed the importance of maintaining cigarette abstinence. yes Diagnosis Code: Personal History of Nicotine Dependence. E95.284 Information about tobacco cessation classes and interventions provided to patient. Yes Patient provided with "ticket" for LDCT Scan. yes Written Order for Lung Cancer Screening with LDCT placed in Epic. Yes (CT Chest Lung Cancer Screening Low Dose W/O CM) [id number] Z12.2-Screening of respiratory organs Z87.891-Personal history of nicotine dependence  I have spent 25 minutes of face to face time with Jasmine Nelson discussing the risks and benefits of lung cancer screening. We viewed a power point together that explained in detail the above noted topics. We paused at intervals to allow for questions to be asked and answered to ensure understanding.We discussed that the single most powerful action that she can take to decrease her risk of developing lung cancer is to quit smoking. We discussed whether or not she is ready to commit to setting a quit date. We discussed options for tools to aid in quitting smoking including nicotine replacement therapy, non-nicotine medications, support groups, Quit Smart classes, and behavior modification. We discussed that often times setting smaller, more achievable goals, such as eliminating 1 cigarette a day for a week and then 2 cigarettes a day for a week can be helpful in slowly decreasing the number of cigarettes smoked. This allows for a sense of accomplishment as well as providing a clinical benefit. I gave her the " Be Stronger Than Your Excuses" card with contact information for community resources, classes, free nicotine replacement therapy, and access to mobile apps, text messaging, and on-line smoking cessation help. I have also given her my card and contact information in the event she needs to contact me.  We discussed  the time and location of the scan, and that either Doroteo Glassman RN or I will call with the results within 24-48 hours of receiving them. I have offered her  a copy of the power point we viewed  as a resource in the event they need reinforcement of the concepts we discussed today in the office. The patient verbalized understanding of all of  the above and had no further questions upon leaving the office. They have my contact information in the event they have any further questions.  I spent 3 minutes counseling on smoking cessation and the health risks of continued tobacco abuse.  I explained to the patient that there has been a high incidence of coronary artery disease noted on these exams. I explained that this is a non-gated exam therefore degree or severity cannot be determined. This patient is not on statin therapy. I have asked the patient to follow-up with their PCP regarding any incidental finding of coronary artery disease and management with diet or medication as their PCP  feels is clinically indicated. The patient verbalized understanding of the above and had no further questions upon completion of the visit.      [name], NP 08/22/2021

## 2021-08-22 NOTE — Patient Instructions (Signed)
Thank you for participating in the [facility]. °It was our pleasure to meet you today. °We will call you with the results of your scan within the next few days. °Your scan will be assigned a Lung RADS category score by the physicians reading the scans.  °This Lung RADS score determines follow up scanning.  °See below for description of categories, and follow up screening recommendations. °We will be in touch to schedule your follow up screening annually or based on recommendations of our providers. °We will fax a copy of your scan results to your Primary Care Physician, or the physician who referred you to the program, to ensure they have the results. °Please call the office if you have any questions or concerns regarding your scanning experience or results.  °Our office number is [phone]. °Please speak with Denise Phelps, RN. She is our Lung Cancer Screening RN. °If she is unavailable when you call, please have the office staff send her a message. She will return your call at her earliest convenience. °Remember, if your scan is normal, we will scan you annually as long as you continue to meet the criteria for the program. (Age 55-77, Current smoker or smoker who has quit within the last 15 years). °If you are a smoker, remember, quitting is the single most powerful action that you can take to decrease your risk of lung cancer and other pulmonary, breathing related problems. °We know quitting is hard, and we are here to help.  °Please let us know if there is anything we can do to help you meet your goal of quitting. °If you are a former smoker, congratulations. We are proud of you! Remain smoke free! °Remember you can refer friends or family members through the number above.  °We will screen them to make sure they meet criteria for the program. °Thank you for helping us take better care of you by participating in Lung Screening. ° °You can receive free nicotine replacement therapy  ( patches, gum or mints) by calling 1-800-QUIT NOW. Please call so we can get you on the path to becoming  a non-smoker. I know it is hard, but you can do this! ° °Lung RADS Categories: ° °Lung RADS 1: no nodules or definitely non-concerning nodules.  °Recommendation is for a repeat annual scan in 12 months. ° °Lung RADS 2:  nodules that are non-concerning in appearance and behavior with a very low likelihood of becoming an active cancer. °Recommendation is for a repeat annual scan in 12 months. ° °Lung RADS 3: nodules that are probably non-concerning , includes nodules with a low likelihood of becoming an active cancer.  Recommendation is for a 6-month repeat screening scan. Often noted after an upper respiratory illness. We will be in touch to make sure you have no questions, and to schedule your 6-month scan. ° °Lung RADS 4 A: nodules with concerning findings, recommendation is most often for a follow up scan in 3 months or additional testing based on our provider's assessment of the scan. We will be in touch to make sure you have no questions and to schedule the recommended 3 month follow up scan. ° °Lung RADS 4 B:  indicates findings that are concerning. We will be in touch with you to schedule additional diagnostic testing based on our provider's  assessment of the scan. ° °Hypnosis for smoking cessation  °[employer] °[phone] ° °Acupuncture for smoking cessation  °[facility] °[phone]  °

## 2021-08-23 ENCOUNTER — Ambulatory Visit (HOSPITAL_BASED_OUTPATIENT_CLINIC_OR_DEPARTMENT_OTHER)
Admission: RE | Admit: 2021-08-23 | Discharge: 2021-08-23 | Disposition: A | Discharge status: Home / Self Care | Payer: PPO | Source: Ambulatory Visit | Attending: Acute Care | Admitting: Acute Care

## 2021-08-23 DIAGNOSIS — Z87891 Personal history of nicotine dependence: Secondary | ICD-10-CM | POA: Diagnosis not present

## 2021-08-23 DIAGNOSIS — F17210 Nicotine dependence, cigarettes, uncomplicated: Secondary | ICD-10-CM | POA: Diagnosis not present

## 2021-08-26 ENCOUNTER — Other Ambulatory Visit (HOSPITAL_COMMUNITY): Payer: Self-pay

## 2021-08-29 ENCOUNTER — Other Ambulatory Visit (HOSPITAL_COMMUNITY): Payer: Self-pay

## 2021-09-02 ENCOUNTER — Other Ambulatory Visit: Payer: Self-pay | Admitting: Acute Care

## 2021-09-02 DIAGNOSIS — Z87891 Personal history of nicotine dependence: Secondary | ICD-10-CM

## 2021-09-02 DIAGNOSIS — F17210 Nicotine dependence, cigarettes, uncomplicated: Secondary | ICD-10-CM

## 2021-09-03 ENCOUNTER — Other Ambulatory Visit (HOSPITAL_COMMUNITY): Payer: Self-pay

## 2021-09-03 ENCOUNTER — Other Ambulatory Visit: Payer: Self-pay | Admitting: Internal Medicine

## 2021-09-05 ENCOUNTER — Other Ambulatory Visit (HOSPITAL_COMMUNITY): Payer: Self-pay

## 2021-09-05 MED ORDER — LOSARTAN POTASSIUM 100 MG PO TABS
ORAL_TABLET | Freq: Every day | ORAL | 0 refills | Status: DC
  Filled 2021-09-05: qty 90, 90d supply, fill #0

## 2021-09-06 ENCOUNTER — Other Ambulatory Visit (HOSPITAL_COMMUNITY): Payer: Self-pay

## 2021-09-06 DIAGNOSIS — H31091 Other chorioretinal scars, right eye: Secondary | ICD-10-CM | POA: Diagnosis not present

## 2021-09-06 DIAGNOSIS — Z961 Presence of intraocular lens: Secondary | ICD-10-CM | POA: Diagnosis not present

## 2021-09-06 DIAGNOSIS — H16223 Keratoconjunctivitis sicca, not specified as Sjogren's, bilateral: Secondary | ICD-10-CM | POA: Diagnosis not present

## 2021-09-06 DIAGNOSIS — H3561 Retinal hemorrhage, right eye: Secondary | ICD-10-CM | POA: Diagnosis not present

## 2021-09-06 MED ORDER — CYCLOSPORINE 0.05 % OP EMUL
1.0000 [drp] | Freq: Two times a day (BID) | OPHTHALMIC | 3 refills | Status: DC
  Filled 2021-09-06: qty 180, 90d supply, fill #0

## 2021-09-12 ENCOUNTER — Other Ambulatory Visit (HOSPITAL_COMMUNITY): Payer: Self-pay

## 2021-10-04 ENCOUNTER — Other Ambulatory Visit: Payer: Self-pay | Admitting: Internal Medicine

## 2021-10-04 ENCOUNTER — Other Ambulatory Visit: Payer: Self-pay

## 2021-10-05 ENCOUNTER — Other Ambulatory Visit (HOSPITAL_COMMUNITY): Payer: Self-pay

## 2021-10-05 MED ORDER — HYDROCHLOROTHIAZIDE 12.5 MG PO TABS
ORAL_TABLET | Freq: Every day | ORAL | 0 refills | Status: DC
  Filled 2021-10-05: qty 90, 90d supply, fill #0

## 2021-10-06 ENCOUNTER — Other Ambulatory Visit (HOSPITAL_COMMUNITY): Payer: Self-pay

## 2021-10-07 ENCOUNTER — Other Ambulatory Visit (HOSPITAL_COMMUNITY): Payer: Self-pay

## 2021-10-08 ENCOUNTER — Other Ambulatory Visit: Payer: Self-pay | Admitting: Internal Medicine

## 2021-10-08 ENCOUNTER — Other Ambulatory Visit (HOSPITAL_COMMUNITY): Payer: Self-pay

## 2021-10-11 ENCOUNTER — Other Ambulatory Visit (HOSPITAL_COMMUNITY): Payer: Self-pay

## 2021-10-11 ENCOUNTER — Other Ambulatory Visit: Payer: Self-pay

## 2021-10-14 ENCOUNTER — Other Ambulatory Visit (HOSPITAL_COMMUNITY): Payer: Self-pay

## 2021-10-14 ENCOUNTER — Other Ambulatory Visit: Payer: Self-pay

## 2021-10-18 ENCOUNTER — Other Ambulatory Visit: Payer: Self-pay

## 2021-10-18 ENCOUNTER — Other Ambulatory Visit (HOSPITAL_COMMUNITY): Payer: Self-pay

## 2021-10-19 ENCOUNTER — Other Ambulatory Visit (HOSPITAL_COMMUNITY): Payer: Self-pay

## 2021-10-19 ENCOUNTER — Other Ambulatory Visit: Payer: Self-pay

## 2021-10-20 ENCOUNTER — Other Ambulatory Visit (HOSPITAL_COMMUNITY): Payer: Self-pay

## 2021-10-20 DIAGNOSIS — Z681 Body mass index (BMI) 19 or less, adult: Secondary | ICD-10-CM | POA: Diagnosis not present

## 2021-10-20 DIAGNOSIS — Z124 Encounter for screening for malignant neoplasm of cervix: Secondary | ICD-10-CM | POA: Diagnosis not present

## 2021-10-20 DIAGNOSIS — Z1231 Encounter for screening mammogram for malignant neoplasm of breast: Secondary | ICD-10-CM | POA: Diagnosis not present

## 2021-10-20 MED ORDER — FLUOXETINE HCL 40 MG PO CAPS
ORAL_CAPSULE | Freq: Every day | ORAL | 0 refills | Status: DC
  Filled 2021-10-20: qty 90, 90d supply, fill #0

## 2021-10-27 ENCOUNTER — Other Ambulatory Visit (HOSPITAL_COMMUNITY): Payer: Self-pay

## 2021-11-01 ENCOUNTER — Other Ambulatory Visit (HOSPITAL_COMMUNITY): Payer: Self-pay

## 2021-11-02 ENCOUNTER — Other Ambulatory Visit (HOSPITAL_COMMUNITY): Payer: Self-pay

## 2021-11-02 MED ORDER — AMPHETAMINE-DEXTROAMPHETAMINE 20 MG PO TABS
ORAL_TABLET | ORAL | 0 refills | Status: DC
  Filled 2021-11-02: qty 180, 90d supply, fill #0

## 2021-11-03 ENCOUNTER — Other Ambulatory Visit (HOSPITAL_COMMUNITY): Payer: Self-pay

## 2021-11-15 ENCOUNTER — Other Ambulatory Visit (HOSPITAL_COMMUNITY): Payer: Self-pay

## 2021-11-16 ENCOUNTER — Other Ambulatory Visit (HOSPITAL_COMMUNITY): Payer: Self-pay

## 2021-11-23 ENCOUNTER — Other Ambulatory Visit (HOSPITAL_COMMUNITY): Payer: Self-pay

## 2021-11-24 ENCOUNTER — Other Ambulatory Visit (HOSPITAL_COMMUNITY): Payer: Self-pay

## 2021-12-01 ENCOUNTER — Other Ambulatory Visit: Payer: Self-pay | Admitting: Internal Medicine

## 2021-12-01 ENCOUNTER — Other Ambulatory Visit (HOSPITAL_COMMUNITY): Payer: Self-pay

## 2021-12-01 MED ORDER — LOSARTAN POTASSIUM 100 MG PO TABS
ORAL_TABLET | Freq: Every day | ORAL | 0 refills | Status: DC
  Filled 2021-12-01: qty 90, 90d supply, fill #0

## 2021-12-02 ENCOUNTER — Other Ambulatory Visit (HOSPITAL_COMMUNITY): Payer: Self-pay

## 2021-12-12 ENCOUNTER — Other Ambulatory Visit (HOSPITAL_COMMUNITY): Payer: Self-pay

## 2022-01-04 ENCOUNTER — Other Ambulatory Visit: Payer: Self-pay | Admitting: Internal Medicine

## 2022-01-04 ENCOUNTER — Other Ambulatory Visit (HOSPITAL_COMMUNITY): Payer: Self-pay

## 2022-01-05 ENCOUNTER — Other Ambulatory Visit (HOSPITAL_COMMUNITY): Payer: Self-pay

## 2022-01-05 MED ORDER — HYDROCHLOROTHIAZIDE 12.5 MG PO TABS
ORAL_TABLET | Freq: Every day | ORAL | 0 refills | Status: DC
  Filled 2022-01-05: qty 90, 90d supply, fill #0

## 2022-01-16 ENCOUNTER — Other Ambulatory Visit (HOSPITAL_COMMUNITY): Payer: Self-pay

## 2022-01-17 ENCOUNTER — Other Ambulatory Visit (HOSPITAL_COMMUNITY): Payer: Self-pay

## 2022-01-17 MED ORDER — FLUOXETINE HCL 40 MG PO CAPS
40.0000 mg | ORAL_CAPSULE | Freq: Every day | ORAL | 0 refills | Status: DC
  Filled 2022-01-17: qty 90, 90d supply, fill #0

## 2022-01-19 ENCOUNTER — Other Ambulatory Visit (HOSPITAL_COMMUNITY): Payer: Self-pay

## 2022-01-19 MED ORDER — CLOBETASOL PROPIONATE 0.05 % EX CREA
TOPICAL_CREAM | CUTANEOUS | 1 refills | Status: DC
  Filled 2022-01-19: qty 60, 30d supply, fill #0

## 2022-02-01 DIAGNOSIS — R29890 Loss of height: Secondary | ICD-10-CM | POA: Diagnosis not present

## 2022-02-01 DIAGNOSIS — N958 Other specified menopausal and perimenopausal disorders: Secondary | ICD-10-CM | POA: Diagnosis not present

## 2022-02-01 DIAGNOSIS — M816 Localized osteoporosis [Lequesne]: Secondary | ICD-10-CM | POA: Diagnosis not present

## 2022-02-06 DIAGNOSIS — F331 Major depressive disorder, recurrent, moderate: Secondary | ICD-10-CM | POA: Diagnosis not present

## 2022-02-06 DIAGNOSIS — F900 Attention-deficit hyperactivity disorder, predominantly inattentive type: Secondary | ICD-10-CM | POA: Diagnosis not present

## 2022-02-06 DIAGNOSIS — G4700 Insomnia, unspecified: Secondary | ICD-10-CM | POA: Diagnosis not present

## 2022-02-07 ENCOUNTER — Other Ambulatory Visit (HOSPITAL_COMMUNITY): Payer: Self-pay

## 2022-02-07 MED ORDER — VILAZODONE HCL 20 MG PO TABS
ORAL_TABLET | ORAL | 0 refills | Status: DC
  Filled 2022-02-07: qty 90, 90d supply, fill #0

## 2022-02-07 MED ORDER — GABAPENTIN 400 MG PO CAPS: ORAL_CAPSULE | ORAL | 4 refills | Status: DC

## 2022-02-07 MED ORDER — GABAPENTIN 400 MG PO CAPS
ORAL_CAPSULE | ORAL | 4 refills | Status: DC
  Filled 2022-02-07: qty 720, 90d supply, fill #0
  Filled 2022-03-14: qty 540, 90d supply, fill #0

## 2022-02-22 ENCOUNTER — Other Ambulatory Visit (HOSPITAL_COMMUNITY): Payer: Self-pay

## 2022-02-22 MED ORDER — BELSOMRA 20 MG PO TABS
1.0000 | ORAL_TABLET | Freq: Every day | ORAL | 1 refills | Status: DC
  Filled 2022-02-22: qty 90, 90d supply, fill #0
  Filled 2022-05-17 (×2): qty 90, 90d supply, fill #1

## 2022-02-23 ENCOUNTER — Other Ambulatory Visit (HOSPITAL_COMMUNITY): Payer: Self-pay

## 2022-02-24 ENCOUNTER — Other Ambulatory Visit (HOSPITAL_COMMUNITY): Payer: Self-pay

## 2022-02-24 ENCOUNTER — Other Ambulatory Visit: Payer: Self-pay | Admitting: Internal Medicine

## 2022-02-27 ENCOUNTER — Other Ambulatory Visit (HOSPITAL_COMMUNITY): Payer: Self-pay

## 2022-02-27 MED ORDER — CLONIDINE HCL 0.1 MG PO TABS
ORAL_TABLET | ORAL | 1 refills | Status: DC
  Filled 2022-02-27: qty 270, 90d supply, fill #0
  Filled 2022-05-21: qty 270, 90d supply, fill #1

## 2022-02-27 MED ORDER — LOSARTAN POTASSIUM 100 MG PO TABS
100.0000 mg | ORAL_TABLET | Freq: Every day | ORAL | 0 refills | Status: DC
  Filled 2022-02-27: qty 90, 90d supply, fill #0

## 2022-03-06 ENCOUNTER — Other Ambulatory Visit (HOSPITAL_COMMUNITY): Payer: Self-pay

## 2022-03-10 ENCOUNTER — Other Ambulatory Visit (HOSPITAL_COMMUNITY): Payer: Self-pay

## 2022-03-11 ENCOUNTER — Other Ambulatory Visit (HOSPITAL_COMMUNITY): Payer: Self-pay

## 2022-03-14 ENCOUNTER — Other Ambulatory Visit (HOSPITAL_COMMUNITY): Payer: Self-pay

## 2022-03-15 ENCOUNTER — Other Ambulatory Visit (HOSPITAL_COMMUNITY): Payer: Self-pay

## 2022-03-15 MED ORDER — GABAPENTIN 400 MG PO CAPS
2400.0000 mg | ORAL_CAPSULE | Freq: Every evening | ORAL | 4 refills | Status: DC
  Filled 2022-03-15 – 2022-06-18 (×2): qty 540, 90d supply, fill #0
  Filled 2022-09-26: qty 540, 90d supply, fill #1
  Filled 2023-01-13: qty 540, 90d supply, fill #2

## 2022-03-17 ENCOUNTER — Other Ambulatory Visit (HOSPITAL_COMMUNITY): Payer: Self-pay

## 2022-03-30 ENCOUNTER — Other Ambulatory Visit (HOSPITAL_COMMUNITY): Payer: Self-pay

## 2022-03-30 DIAGNOSIS — F17210 Nicotine dependence, cigarettes, uncomplicated: Secondary | ICD-10-CM | POA: Diagnosis not present

## 2022-03-30 DIAGNOSIS — I10 Essential (primary) hypertension: Secondary | ICD-10-CM | POA: Diagnosis not present

## 2022-03-30 DIAGNOSIS — L2084 Intrinsic (allergic) eczema: Secondary | ICD-10-CM | POA: Diagnosis not present

## 2022-03-30 DIAGNOSIS — L853 Xerosis cutis: Secondary | ICD-10-CM | POA: Diagnosis not present

## 2022-03-30 MED ORDER — CLOBETASOL PROPIONATE 0.05 % EX CREA
TOPICAL_CREAM | CUTANEOUS | 1 refills | Status: DC
  Filled 2022-03-30: qty 15, 30d supply, fill #0
  Filled 2022-05-22: qty 60, 30d supply, fill #1
  Filled 2022-05-22: qty 60, 90d supply, fill #1
  Filled 2022-08-07: qty 45, 22d supply, fill #2

## 2022-04-06 ENCOUNTER — Other Ambulatory Visit: Payer: Self-pay | Admitting: Internal Medicine

## 2022-04-07 ENCOUNTER — Other Ambulatory Visit (HOSPITAL_COMMUNITY): Payer: Self-pay

## 2022-04-07 MED ORDER — HYDROCHLOROTHIAZIDE 12.5 MG PO TABS
ORAL_TABLET | Freq: Every day | ORAL | 0 refills | Status: DC
  Filled 2022-04-07: qty 90, 90d supply, fill #0

## 2022-05-16 ENCOUNTER — Telehealth: Payer: Self-pay | Admitting: Internal Medicine

## 2022-05-16 ENCOUNTER — Other Ambulatory Visit (HOSPITAL_COMMUNITY): Payer: Self-pay

## 2022-05-16 MED ORDER — ESCITALOPRAM OXALATE 20 MG PO TABS
20.0000 mg | ORAL_TABLET | Freq: Every day | ORAL | 2 refills | Status: DC
  Filled 2022-05-16: qty 30, 30d supply, fill #0
  Filled 2022-06-11: qty 30, 30d supply, fill #1
  Filled 2022-07-09: qty 30, 30d supply, fill #2

## 2022-05-16 NOTE — Telephone Encounter (Signed)
Left message for patient to call back and schedule Medicare Annual Wellness Visit (AWV) either virtually or in office. Left  my Jasmine Nelson number [phone]   Last AWV ;05/16/22  please schedule at anytime with [facility] Nurse Health Advisor 1 or 2

## 2022-05-17 ENCOUNTER — Other Ambulatory Visit (HOSPITAL_COMMUNITY): Payer: Self-pay

## 2022-05-22 ENCOUNTER — Ambulatory Visit (INDEPENDENT_AMBULATORY_CARE_PROVIDER_SITE_OTHER): Payer: PPO

## 2022-05-22 ENCOUNTER — Other Ambulatory Visit (HOSPITAL_COMMUNITY): Payer: Self-pay

## 2022-05-22 VITALS — Ht 66.5 in | Wt 115.0 lb

## 2022-05-22 DIAGNOSIS — Z0000 Encounter for general adult medical examination without abnormal findings: Secondary | ICD-10-CM | POA: Diagnosis not present

## 2022-05-22 NOTE — Progress Notes (Signed)
I connected with Jasmine Nelson today by telephone and verified that I am speaking with the correct person using two identifiers. Location patient: home Location provider: work Persons participating in the virtual visit: Skyelyn Scruggs, Glenna Durand LPN.   I discussed the limitations, risks, security and privacy concerns of performing an evaluation and management service by telephone and the availability of in person appointments. I also discussed with the patient that there may be a patient responsible charge related to this service. The patient expressed understanding and verbally consented to this telephonic visit.    Interactive audio and video telecommunications were attempted between this provider and patient, however failed, due to patient having technical difficulties OR patient did not have access to video capability.  We continued and completed visit with audio only.     Vital signs may be patient reported or missing.  Subjective:   Jasmine Nelson is a 66 y.o. female who presents for Medicare Annual (Subsequent) preventive examination.  Review of Systems     Cardiac Risk Factors include: advanced age (>76mn, >>36women);smoking/ tobacco exposure     Objective:    Today's Vitals   05/22/22 1327  Weight: 115 lb (52.2 kg)  Height: 5' 6.5" (1.689 m)   Body mass index is 18.28 kg/m.     05/22/2022    1:36 PM 05/18/2021    3:47 PM 01/03/2018    7:27 AM  Advanced Directives  Does Patient Have a Medical Advance Directive? Yes No No  [place]    Copy of [place]? No - copy requested    Would patient like information on creating a medical advance directive?  No - Patient declined No - Patient declined    Current Medications (verified) Outpatient Encounter Medications as of 05/22/2022  Medication Sig   amphetamine-dextroamphetamine (ADDERALL) 20 MG tablet Take 20 mg by mouth daily.    amphetamine-dextroamphetamine (ADDERALL) 20 MG tablet Take one tablet by mouth in the morning and one tablet at noon   calcium citrate-vitamin D (CITRACAL+D) 315-200 MG-UNIT tablet Take 1 tablet by mouth 2 (two) times daily.   clobetasol cream (TEMOVATE) 0.05 % Apply twice daily to the affected areas   clobetasol cream (TEMOVATE) 0.05 % Apply to affected areas twice daily.   cloNIDine (CATAPRES) 0.1 MG tablet Take 2 to 3 tablets by mouth at bedtime   cycloSPORINE (RESTASIS) 0.05 % ophthalmic emulsion Place 1 drop into both eyes twice a day   escitalopram (LEXAPRO) 20 MG tablet Take 1 tablet by mouth daily.   gabapentin (NEURONTIN) 400 MG capsule Take 6 capsules by mouth at bedtime   gabapentin (NEURONTIN) 400 MG capsule Take 6 capsules by moth at bedtime   gabapentin (NEURONTIN) 400 MG capsule Take 6 capsules (2,400 mg total) by mouth at bedtime.   hydrochlorothiazide (HYDRODIURIL) 12.5 MG tablet TAKE 1 TABLET BY MOUTH ONCE DAILY   losartan (COZAAR) 100 MG tablet Take 1 tablet (100 mg total) by mouth daily. SCHEDULE APPT FOR FUTURE REFILLS   RESTASIS 0.05 % ophthalmic emulsion    Suvorexant (BELSOMRA) 20 MG TABS Take 1 tablet by mouth at bedtime   Zinc Sulfate (ZINC 15 PO) Take 1 tablet by mouth daily.   ADDERALL XR 30 MG 24 hr capsule    CHANTIX STARTING MONTH PAK 0.5 MG X 11 & 1 MG X 42 tablet  (Patient not taking: Reported on 05/22/2022)   chlordiazePOXIDE (LIBRIUM) 5 MG capsule Take 5  mg by mouth 2 (two) times daily.    Cholecalciferol (VITAMIN D PO) Take by mouth.   cycloSPORINE, PF, (CEQUA) 0.09 % SOLN Instill 1 drop into both eyes twice a day   fluocinonide ointment (LIDEX) 5.78 % Apply 1 application topically 2 (two) times daily. Please mail to patient   FLUoxetine (PROZAC) 40 MG capsule fluoxetine 40 mg capsule   FLUoxetine (PROZAC) 40 MG capsule TAKE 1 CAPSULE BY MOUTH ONCE A DAY   FLUoxetine (PROZAC) 40 MG capsule Take 1 capsule by mouth once a day.   gabapentin (NEURONTIN) 400 MG  capsule TAKE 3-4 CAPSULES BY MOUTH EVERY EVENING   Suvorexant (BELSOMRA) 15 MG TABS Take 1 tablet by mouth at bedtime   Vilazodone HCl (VIIBRYD) 20 MG TABS Take 1 tablet by mouth daily with a full meal   Vitamin D, Ergocalciferol, (DRISDOL) 1.25 MG (50000 UT) CAPS capsule Take 1 capsule (50,000 Units total) by mouth every 7 (seven) days.   vortioxetine HBr (TRINTELLIX) 20 MG TABS tablet Take 20 mg by mouth daily.   zolpidem (AMBIEN) 10 MG tablet Take 10 mg by mouth at bedtime as needed for sleep.   zolpidem (AMBIEN) 10 MG tablet TAKE 1 TABLET BY MOUTH EVERY EVENING AS NEEDED   zolpidem (AMBIEN) 10 MG tablet TAKE 1 TABLET BY MOUTH EVERY EVENING AT BEDTIME AS NEEDED.   [DISCONTINUED] gabapentin (NEURONTIN) 300 MG capsule Take 300 mg by mouth 3 (three) times daily.    [DISCONTINUED] gabapentin (NEURONTIN) 400 MG capsule Take one capsule by mouth twice a day and four capsules at bedtime   [DISCONTINUED] Lactobacillus (PROBIOTIC ACIDOPHILUS PO) Take 1 capsule by mouth daily. (Patient not taking: Reported on 05/22/2022)   Facility-Administered Encounter Medications as of 05/22/2022  Medication   0.9 %  sodium chloride infusion    Allergies (verified) Lisinopril   History: Past Medical History:  Diagnosis Date   Allergy    Anxiety    Cataract    both eyes   Chicken pox    COPD (chronic obstructive pulmonary disease) (HCC)    Depression    Glaucoma    Hamstring injury    torn   Hx of thyroid disease    3rd grade  resolved    Hypertension    Ingestion of substance    3 04   Osteoporosis    Shift work sleep disorder    Past Surgical History:  Procedure Laterality Date   ADENOIDECTOMY  1959   BREAST BIOPSY Right 2019   COLONOSCOPY     Family History  Problem Relation Age of Onset   Cancer Mother 93       Died at 10 of Non-Hodgkins Lymphoma   Lymphoma Mother        33   Colon polyps Mother        "many"; required regular colonoscopies   Alcohol abuse Father    COPD Father     Depression Father    Hyperlipidemia Father    Hypertension Father        deceased   Colon polyps Father        "many"; required regular colonoscopies   Colon cancer Sister        dx. 28-56   Other Sister        +CHEK2 mutation   COPD Maternal Aunt    Ovarian cancer Maternal Aunt 62   Stroke Maternal Grandmother    Heart Problems Maternal Grandmother    Cancer Maternal Grandfather 30  Brain Cancer   Arthritis Paternal Grandmother    Depression Paternal Grandmother    Glaucoma Paternal Grandmother    Breast cancer Paternal Grandmother        dx. 50s   Uterine cancer Paternal Grandmother        dx. 68s   Renal cancer Paternal Grandfather        smoker   Multiple myeloma Maternal Uncle        dx. well before his 43s   Ovarian cancer Other    Esophageal cancer Neg Hx    Stomach cancer Neg Hx    Rectal cancer Neg Hx    Social History   Socioeconomic History   Marital status: Married    Spouse name: Not on file   Number of children: Not on file   Years of education: Not on file   Highest education level: Not on file  Occupational History   Not on file  Tobacco Use   Smoking status: Every Day    Packs/day: 0.50    Types: Cigarettes   Smokeless tobacco: Never  Vaping Use   Vaping Use: Never used  Substance and Sexual Activity   Alcohol use: Yes    Comment: occasional wine   Drug use: No   Sexual activity: Not on file  Other Topics Concern   Not on file  Social History Narrative   Averages 5-8 hours of sleep per day.   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   [id number]      Social Determinants of Health   Financial Resource Strain: Low Risk  (05/22/2022)   Overall Financial Resource Strain (CARDIA)    Difficulty of Paying Living Expenses: Not hard at all  Food Insecurity: No Food Insecurity (05/22/2022)    Hunger Vital Sign    Worried About Running Out of Food in the Last Year: Never true    Ran Out of Food in the Last Year: Never true  Transportation Needs: No Transportation Needs (05/22/2022)   PRAPARE - Hydrologist (Medical): No    Lack of Transportation (Non-Medical): No  Physical Activity: Inactive (05/22/2022)   Exercise Vital Sign    Days of Exercise per Week: 0 days    Minutes of Exercise per Session: 0 min  Stress: No Stress Concern Present (05/22/2022)   [place]    Feeling of Stress : Only a little  Social Connections: Moderately Isolated (05/18/2021)   Social Connection and Isolation Panel [NHANES]    Frequency of Communication with Friends and Family: Twice a week    Frequency of Social Gatherings with Friends and Family: Twice a week    Attends Religious Services: Never    Printmaker: No    Attends Music therapist: Never    Marital Status: Married    Tobacco Counseling Ready to quit: Yes Counseling given: Not Answered   Clinical Intake:  Pre-visit preparation completed: Yes  Pain : No/denies pain     Nutritional Status: BMI <19  Underweight Nutritional Risks: None Diabetes: No  How often do you need to have someone help you when you read instructions, pamphlets, or other written materials from your doctor or  pharmacy?: 1 - Never What is the last grade level you completed in school?: BS degree  Diabetic? no  Interpreter Needed?: No  Information entered by :: NAllen LPN   Activities of Daily Living    05/22/2022    1:37 PM 05/18/2022   11:04 AM  In your present state of health, do you have any difficulty performing the following activities:  Hearing? 0 0  Vision? 0 0  Difficulty concentrating or making decisions? 0 0  Walking or climbing stairs? 0 0  Dressing or bathing? 0 0  Doing errands, shopping? 0 0   Preparing Food and eating ? N N  Using the Toilet? N N  In the past six months, have you accidently leaked urine? N N  Do you have problems with loss of bowel control? N N  Managing your Medications? N N  Managing your Finances? N N  Housekeeping or managing your Housekeeping? N N    Patient Care Team: Panosh, Standley Brooking, MD as PCP - Lillia Pauls, MD as Consulting Physician (Ophthalmology) Chucky May, MD as Consulting Physician (Psychiatry)  Indicate any recent Medical Services you may have received from other than [facility] providers in the past year (date may be approximate).     Assessment:   This is a routine wellness examination for [facility].  Hearing/Vision screen Vision Screening - Comments:: Regular eye exams, [facility]  Dietary issues and exercise activities discussed: Current Exercise Habits: The patient does not participate in regular exercise at present   Goals Addressed             This Visit's Progress    Patient Stated       05/22/2022, start exercising and quit smoking       Depression Screen    05/22/2022    1:37 PM 05/18/2021    3:41 PM 11/08/2018    8:41 AM  PHQ 2/9 Scores  PHQ - 2 Score 0 0 0    Fall Risk    05/22/2022    1:37 PM 05/18/2022   11:04 AM 05/18/2021    3:48 PM  [place] in the past year? 0 0 0  Number falls in past yr: 0 0 0  Injury with Fall? 0 0 0  Risk for fall due to : Medication side effect  No Fall Risks  Follow up Falls evaluation completed;Education provided;Falls prevention discussed  Falls evaluation completed    FALL RISK PREVENTION PERTAINING TO THE HOME:  Any stairs in or around the home? Yes  If so, are there any without handrails? No  Home free of loose throw rugs in walkways, pet beds, electrical cords, etc? Yes  Adequate lighting in your home to reduce risk of falls? Yes   ASSISTIVE DEVICES UTILIZED TO PREVENT FALLS:  Life alert? No  Use of a cane, walker or w/c? No  Grab bars in the  bathroom? No  Shower chair or bench in shower? Yes  Elevated toilet seat or a handicapped toilet? Yes   TIMED UP AND GO:  Was the test performed? No .      Cognitive Function:        05/22/2022    1:38 PM  6CIT Screen  What Year? 0 points  What month? 0 points  What time? 0 points  Count back from 20 0 points  Months in reverse 0 points  Repeat phrase 0 points  Total Score 0 points    Immunizations Immunization History  Administered Date(s) Administered   Influenza Whole 07/09/2009   Moderna SARS-COV2 Booster Vaccination 08/09/2020   Moderna Sars-Covid-2 Vaccination 12/18/2019, 01/14/2020   PFIZER(Purple Top)SARS-COV-2 Vaccination 03/25/2021   Pneumococcal Conjugate-13 10/31/2013   Pneumococcal Polysaccharide-23 01/22/2015, 05/18/2021   Td 11/08/2018   Zoster Recombinat (Shingrix) 11/08/2018, 06/20/2019    TDAP status: Up to date  Flu Vaccine status: Due, Education has been provided regarding the importance of this vaccine. Advised may receive this vaccine at local pharmacy or Health Dept. Aware to provide a copy of the vaccination record if obtained from local pharmacy or Health Dept. Verbalized acceptance and understanding.  Pneumococcal vaccine status: Up to date  Covid-19 vaccine status: Completed vaccines  Qualifies for Shingles Vaccine? Yes   Zostavax completed Yes   Shingrix Completed?: Yes  Screening Tests Health Maintenance  Topic Date Due   FOOT EXAM  Never done   OPHTHALMOLOGY EXAM  Never done   MAMMOGRAM  05/27/2017   COVID-19 Vaccine (4 - Mixed Product risk series) 05/20/2021   HEMOGLOBIN A1C  11/10/2021   INFLUENZA VACCINE  04/25/2022   COLONOSCOPY (Pts 45-65yr Insurance coverage will need to be confirmed)  01/04/2023   TETANUS/TDAP  11/08/2028   Pneumonia Vaccine 66 Years old  Completed   DEXA SCAN  Completed   Hepatitis C Screening  Completed   Zoster Vaccines- Shingrix  Completed   HPV VACCINES  Aged Out    Health  Maintenance  Health Maintenance Due  Topic Date Due   FOOT EXAM  Never done   OPHTHALMOLOGY EXAM  Never done   MAMMOGRAM  05/27/2017   COVID-19 Vaccine (4 - Mixed Product risk series) 05/20/2021   HEMOGLOBIN A1C  11/10/2021   INFLUENZA VACCINE  04/25/2022    Colorectal cancer screening: Type of screening: Colonoscopy. Completed 01/03/2018. Repeat every 5 years  Mammogram status: Completed 2023. Repeat every year  Bone Density status: Completed 2022.   Lung Cancer Screening: (Low Dose CT Chest recommended if Age 938-80years, 30 pack-year currently smoking OR have quit w/in 15years.) does qualify.   Lung Cancer Screening Referral: CT scan 08/23/2021  Additional Screening:  Hepatitis C Screening: does qualify; Completed 07/02/2015  Vision Screening: Recommended annual ophthalmology exams for early detection of glaucoma and other disorders of the eye. Is the patient up to date with their annual eye exam?  Yes  Who is the provider or what is the name of the office in which the patient attends annual eye exams? [facility] If pt is not established with a provider, would they like to be referred to a provider to establish care? No .   Dental Screening: Recommended annual dental exams for proper oral hygiene  Community Resource Referral / Chronic Care Management: CRR required this visit?  No   CCM required this visit?  No      Plan:     I have personally reviewed and noted the following in the patient's chart:   Medical and social history Use of alcohol, tobacco or illicit drugs  Current medications and supplements including opioid prescriptions. Patient is not currently taking opioid prescriptions. Functional ability and status Nutritional status Physical activity Advanced directives List of other physicians Hospitalizations, surgeries, and ER visits in previous 12 months Vitals Screenings to include cognitive, depression, and falls Referrals and appointments  In  addition, I have reviewed and discussed with patient certain preventive protocols, quality metrics, and best practice recommendations. A written personalized care plan for preventive services as well as general preventive health  recommendations were provided to patient.     Kellie Simmering, LPN   2/67/1245   Nurse Notes: none  Due to this being a virtual visit, the after visit summary with patients personalized plan was offered to patient via mail or my-chart. Patient would like to access on my-chart

## 2022-05-22 NOTE — Patient Instructions (Signed)
Jasmine Nelson , Thank you for taking time to come for your Medicare Wellness Visit. I appreciate your ongoing commitment to your health goals. Please review the following plan we discussed and let me know if I can assist you in the future.   Screening recommendations/referrals: Colonoscopy: completed 01/03/2018, due 01/04/2023 Mammogram: completed 2023 per patient Bone Density: completed 2022 per patient Recommended yearly ophthalmology/optometry visit for glaucoma screening and checkup Recommended yearly dental visit for hygiene and checkup  Vaccinations: Influenza vaccine: due Pneumococcal vaccine: completed 05/18/2021 Tdap vaccine: completed 11/08/2018, due 11/08/2028 Shingles vaccine: completed   Covid-19: 7/1//2022, 08/09/2020, 01/14/2020, 12/18/2019  Advanced directives: Please bring a copy of your POA (Power of Attorney) and/or Living Will to your next appointment.   Conditions/risks identified: smoking  Next appointment: Follow up in one year for your annual wellness visit    Preventive Care 67 Years and Older, Female Preventive care refers to lifestyle choices and visits with your health care provider that can promote health and wellness. What does preventive care include? A yearly physical exam. This is also called an annual well check. Dental exams once or twice a year. Routine eye exams. Ask your health care provider how often you should have your eyes checked. Personal lifestyle choices, including: Daily care of your teeth and gums. Regular physical activity. Eating a healthy diet. Avoiding tobacco and drug use. Limiting alcohol use. Practicing safe sex. Taking low-dose aspirin every day. Taking vitamin and mineral supplements as recommended by your health care provider. What happens during an annual well check? The services and screenings done by your health care provider during your annual well check will depend on your age, overall health, lifestyle risk factors, and  family history of disease. Counseling  Your health care provider may ask you questions about your: Alcohol use. Tobacco use. Drug use. Emotional well-being. Home and relationship well-being. Sexual activity. Eating habits. History of falls. Memory and ability to understand (cognition). Work and work Statistician. Reproductive health. Screening  You may have the following tests or measurements: Height, weight, and BMI. Blood pressure. Lipid and cholesterol levels. These may be checked every 5 years, or more frequently if you are over 43 years old. Skin check. Lung cancer screening. You may have this screening every year starting at age 67 if you have a 30-pack-year history of smoking and currently smoke or have quit within the past 15 years. Fecal occult blood test (FOBT) of the stool. You may have this test every year starting at age 67. Flexible sigmoidoscopy or colonoscopy. You may have a sigmoidoscopy every 5 years or a colonoscopy every 10 years starting at age 67. Hepatitis C blood test. Hepatitis B blood test. Sexually transmitted disease (STD) testing. Diabetes screening. This is done by checking your blood sugar (glucose) after you have not eaten for a while (fasting). You may have this done every 1-3 years. Bone density scan. This is done to screen for osteoporosis. You may have this done starting at age 67. Mammogram. This may be done every 1-2 years. Talk to your health care provider about how often you should have regular mammograms. Talk with your health care provider about your test results, treatment options, and if necessary, the need for more tests. Vaccines  Your health care provider may recommend certain vaccines, such as: Influenza vaccine. This is recommended every year. Tetanus, diphtheria, and acellular pertussis (Tdap, Td) vaccine. You may need a Td booster every 10 years. Zoster vaccine. You may need this after age 67. Pneumococcal  13-valent conjugate  (PCV13) vaccine. One dose is recommended after age 67. Pneumococcal polysaccharide (PPSV23) vaccine. One dose is recommended after age 67. Talk to your health care provider about which screenings and vaccines you need and how often you need them. This information is not intended to replace advice given to you by your health care provider. Make sure you discuss any questions you have with your health care provider. Document Released: 10/08/2015 Document Revised: 05/31/2016 Document Reviewed: 07/13/2015 Elsevier Interactive Patient Education  2017 [place] Prevention in the Home Falls can cause injuries. They can happen to people of all ages. There are many things you can do to make your home safe and to help prevent falls. What can I do on the outside of my home? Regularly fix the edges of walkways and driveways and fix any cracks. Remove anything that might make you trip as you walk through a door, such as a raised step or threshold. Trim any bushes or trees on the path to your home. Use bright outdoor lighting. Clear any walking paths of anything that might make someone trip, such as rocks or tools. Regularly check to see if handrails are loose or broken. Make sure that both sides of any steps have handrails. Any raised decks and porches should have guardrails on the edges. Have any leaves, snow, or ice cleared regularly. Use sand or salt on walking paths during winter. Clean up any spills in your garage right away. This includes oil or grease spills. What can I do in the bathroom? Use night lights. Install grab bars by the toilet and in the tub and shower. Do not use towel bars as grab bars. Use non-skid mats or decals in the tub or shower. If you need to sit down in the shower, use a plastic, non-slip stool. Keep the floor dry. Clean up any water that spills on the floor as soon as it happens. Remove soap buildup in the tub or shower regularly. Attach bath mats securely with  double-sided non-slip rug tape. Do not have throw rugs and other things on the floor that can make you trip. What can I do in the bedroom? Use night lights. Make sure that you have a light by your bed that is easy to reach. Do not use any sheets or blankets that are too big for your bed. They should not hang down onto the floor. Have a firm chair that has side arms. You can use this for support while you get dressed. Do not have throw rugs and other things on the floor that can make you trip. What can I do in the kitchen? Clean up any spills right away. Avoid walking on wet floors. Keep items that you use a lot in easy-to-reach places. If you need to reach something above you, use a strong step stool that has a grab bar. Keep electrical cords out of the way. Do not use floor polish or wax that makes floors slippery. If you must use wax, use non-skid floor wax. Do not have throw rugs and other things on the floor that can make you trip. What can I do with my stairs? Do not leave any items on the stairs. Make sure that there are handrails on both sides of the stairs and use them. Fix handrails that are broken or loose. Make sure that handrails are as long as the stairways. Check any carpeting to make sure that it is firmly attached to the stairs. Fix any carpet  that is loose or worn. Avoid having throw rugs at the top or bottom of the stairs. If you do have throw rugs, attach them to the floor with carpet tape. Make sure that you have a light switch at the top of the stairs and the bottom of the stairs. If you do not have them, ask someone to add them for you. What else can I do to help prevent falls? Wear shoes that: Do not have high heels. Have rubber bottoms. Are comfortable and fit you well. Are closed at the toe. Do not wear sandals. If you use a stepladder: Make sure that it is fully opened. Do not climb a closed stepladder. Make sure that both sides of the stepladder are locked  into place. Ask someone to hold it for you, if possible. Clearly mark and make sure that you can see: Any grab bars or handrails. First and last steps. Where the edge of each step is. Use tools that help you move around (mobility aids) if they are needed. These include: Canes. Walkers. Scooters. Crutches. Turn on the lights when you go into a dark area. Replace any light bulbs as soon as they burn out. Set up your furniture so you have a clear path. Avoid moving your furniture around. If any of your floors are uneven, fix them. If there are any pets around you, be aware of where they are. Review your medicines with your doctor. Some medicines can make you feel dizzy. This can increase your chance of falling. Ask your doctor what other things that you can do to help prevent falls. This information is not intended to replace advice given to you by your health care provider. Make sure you discuss any questions you have with your health care provider. Document Released: 07/08/2009 Document Revised: 02/17/2016 Document Reviewed: 10/16/2014 Elsevier Interactive Patient Education  2017 [employer].

## 2022-05-25 ENCOUNTER — Other Ambulatory Visit (HOSPITAL_COMMUNITY): Payer: Self-pay

## 2022-05-30 DIAGNOSIS — G4710 Hypersomnia, unspecified: Secondary | ICD-10-CM | POA: Diagnosis not present

## 2022-05-31 DIAGNOSIS — G4710 Hypersomnia, unspecified: Secondary | ICD-10-CM | POA: Diagnosis not present

## 2022-06-07 DIAGNOSIS — G4733 Obstructive sleep apnea (adult) (pediatric): Secondary | ICD-10-CM | POA: Diagnosis not present

## 2022-06-08 ENCOUNTER — Other Ambulatory Visit: Payer: Self-pay | Admitting: Internal Medicine

## 2022-06-09 ENCOUNTER — Other Ambulatory Visit (HOSPITAL_COMMUNITY): Payer: Self-pay

## 2022-06-09 MED ORDER — LOSARTAN POTASSIUM 100 MG PO TABS
100.0000 mg | ORAL_TABLET | Freq: Every day | ORAL | 0 refills | Status: DC
  Filled 2022-06-09: qty 30, 30d supply, fill #0

## 2022-06-11 ENCOUNTER — Other Ambulatory Visit (HOSPITAL_COMMUNITY): Payer: Self-pay

## 2022-06-12 ENCOUNTER — Other Ambulatory Visit (HOSPITAL_COMMUNITY): Payer: Self-pay

## 2022-06-19 ENCOUNTER — Other Ambulatory Visit (HOSPITAL_COMMUNITY): Payer: Self-pay

## 2022-06-20 ENCOUNTER — Other Ambulatory Visit (HOSPITAL_COMMUNITY): Payer: Self-pay

## 2022-06-26 DIAGNOSIS — Z23 Encounter for immunization: Secondary | ICD-10-CM | POA: Diagnosis not present

## 2022-06-29 ENCOUNTER — Other Ambulatory Visit: Payer: Self-pay | Admitting: Internal Medicine

## 2022-06-30 ENCOUNTER — Other Ambulatory Visit (HOSPITAL_COMMUNITY): Payer: Self-pay

## 2022-07-03 ENCOUNTER — Other Ambulatory Visit (HOSPITAL_COMMUNITY): Payer: Self-pay

## 2022-07-03 MED ORDER — HYDROCHLOROTHIAZIDE 12.5 MG PO TABS
12.5000 mg | ORAL_TABLET | Freq: Every day | ORAL | 0 refills | Status: DC
  Filled 2022-07-03: qty 30, 30d supply, fill #0

## 2022-07-05 DIAGNOSIS — G4733 Obstructive sleep apnea (adult) (pediatric): Secondary | ICD-10-CM | POA: Diagnosis not present

## 2022-07-09 ENCOUNTER — Other Ambulatory Visit: Payer: Self-pay | Admitting: Internal Medicine

## 2022-07-10 ENCOUNTER — Other Ambulatory Visit (HOSPITAL_COMMUNITY): Payer: Self-pay

## 2022-07-11 NOTE — Telephone Encounter (Signed)
Contacted patient. Left a voicemail to call us back.  

## 2022-07-12 ENCOUNTER — Encounter: Payer: Self-pay | Admitting: Internal Medicine

## 2022-07-12 ENCOUNTER — Other Ambulatory Visit (HOSPITAL_COMMUNITY): Payer: Self-pay

## 2022-07-12 MED ORDER — LOSARTAN POTASSIUM 100 MG PO TABS
100.0000 mg | ORAL_TABLET | Freq: Every day | ORAL | 0 refills | Status: DC
  Filled 2022-07-12: qty 90, 90d supply, fill #0

## 2022-07-12 NOTE — Telephone Encounter (Signed)
Disregard

## 2022-07-12 NOTE — Telephone Encounter (Signed)
Pt called to see why she needed to sched a phys to refill meds. I let her know that according to Dr she would need to have one in order to get any future refills. Pt sched phys for Wed. 07/19/22 at 12:30pm. Pt was wondering since her phys was sched can she go ahead and get her refill sent to the pharm bcuz she only have 3 pills left.   Please advise.

## 2022-07-19 ENCOUNTER — Encounter: Payer: Self-pay | Admitting: Internal Medicine

## 2022-07-19 ENCOUNTER — Other Ambulatory Visit (HOSPITAL_COMMUNITY): Payer: Self-pay

## 2022-07-19 ENCOUNTER — Ambulatory Visit (INDEPENDENT_AMBULATORY_CARE_PROVIDER_SITE_OTHER): Payer: PPO | Admitting: Internal Medicine

## 2022-07-19 VITALS — BP 116/74 | HR 68 | Temp 98.4°F | Ht 64.0 in | Wt 114.2 lb

## 2022-07-19 DIAGNOSIS — Z0000 Encounter for general adult medical examination without abnormal findings: Secondary | ICD-10-CM | POA: Diagnosis not present

## 2022-07-19 DIAGNOSIS — Z79899 Other long term (current) drug therapy: Secondary | ICD-10-CM | POA: Diagnosis not present

## 2022-07-19 DIAGNOSIS — L308 Other specified dermatitis: Secondary | ICD-10-CM | POA: Diagnosis not present

## 2022-07-19 DIAGNOSIS — F17200 Nicotine dependence, unspecified, uncomplicated: Secondary | ICD-10-CM | POA: Diagnosis not present

## 2022-07-19 DIAGNOSIS — I10 Essential (primary) hypertension: Secondary | ICD-10-CM | POA: Diagnosis not present

## 2022-07-19 DIAGNOSIS — R739 Hyperglycemia, unspecified: Secondary | ICD-10-CM

## 2022-07-19 DIAGNOSIS — E559 Vitamin D deficiency, unspecified: Secondary | ICD-10-CM | POA: Diagnosis not present

## 2022-07-19 DIAGNOSIS — R748 Abnormal levels of other serum enzymes: Secondary | ICD-10-CM | POA: Diagnosis not present

## 2022-07-19 LAB — CBC WITH DIFFERENTIAL/PLATELET
Basophils Absolute: 0.1 10*3/uL (ref 0.0–0.1)
Basophils Relative: 1.3 % (ref 0.0–3.0)
Eosinophils Absolute: 0.3 10*3/uL (ref 0.0–0.7)
Eosinophils Relative: 5 % (ref 0.0–5.0)
HCT: 38.6 % (ref 36.0–46.0)
Hemoglobin: 12.7 g/dL (ref 12.0–15.0)
Lymphocytes Relative: 23.5 % (ref 12.0–46.0)
Lymphs Abs: 1.4 10*3/uL (ref 0.7–4.0)
MCHC: 32.8 g/dL (ref 30.0–36.0)
MCV: 92.3 fl (ref 78.0–100.0)
Monocytes Absolute: 0.5 10*3/uL (ref 0.1–1.0)
Monocytes Relative: 8.8 % (ref 3.0–12.0)
Neutro Abs: 3.8 10*3/uL (ref 1.4–7.7)
Neutrophils Relative %: 61.4 % (ref 43.0–77.0)
Platelets: 299 10*3/uL (ref 150.0–400.0)
RBC: 4.19 Mil/uL (ref 3.87–5.11)
RDW: 14.7 % (ref 11.5–15.5)
WBC: 6.1 10*3/uL (ref 4.0–10.5)

## 2022-07-19 LAB — LIPID PANEL
Cholesterol: 222 mg/dL — ABNORMAL HIGH (ref 0–200)
HDL: 79.4 mg/dL (ref >39.00)
LDL Cholesterol: 123 mg/dL — ABNORMAL HIGH (ref 0–99)
NonHDL: 142.32
Total CHOL/HDL Ratio: 3
Triglycerides: 95 mg/dL (ref 0.0–149.0)
VLDL: 19 mg/dL (ref 0.0–40.0)

## 2022-07-19 LAB — BASIC METABOLIC PANEL
BUN: 15 mg/dL (ref 6–23)
CO2: 31 mEq/L (ref 19–32)
Calcium: 9.3 mg/dL (ref 8.4–10.5)
Chloride: 99 mEq/L (ref 96–112)
Creatinine, Ser: 0.61 mg/dL (ref 0.40–1.20)
GFR: 92.68 mL/min (ref >60.00)
Glucose, Bld: 76 mg/dL (ref 70–99)
Potassium: 4.1 mEq/L (ref 3.5–5.1)
Sodium: 135 mEq/L (ref 135–145)

## 2022-07-19 LAB — HEPATIC FUNCTION PANEL
ALT: 17 U/L (ref 0–35)
AST: 23 U/L (ref 0–37)
Albumin: 4 g/dL (ref 3.5–5.2)
Alkaline Phosphatase: 74 U/L (ref 39–117)
Bilirubin, Direct: 0.1 mg/dL (ref 0.0–0.3)
Total Bilirubin: 0.4 mg/dL (ref 0.2–1.2)
Total Protein: 6.7 g/dL (ref 6.0–8.3)

## 2022-07-19 LAB — HEMOGLOBIN A1C: Hgb A1c MFr Bld: 6 % (ref 4.6–6.5)

## 2022-07-19 LAB — VITAMIN D 25 HYDROXY (VIT D DEFICIENCY, FRACTURES): VITD: 31.9 ng/mL (ref 30.00–100.00)

## 2022-07-19 MED ORDER — HYDROXYZINE PAMOATE 25 MG PO CAPS
25.0000 mg | ORAL_CAPSULE | Freq: Three times a day (TID) | ORAL | 0 refills | Status: DC | PRN
  Filled 2022-07-19: qty 30, 10d supply, fill #0

## 2022-07-19 NOTE — Progress Notes (Unsigned)
No chief complaint on file.   HPI: Patient  Jasmine Nelson  66 y.o. comes in today for Preventive Health Care visit  [facility] ok  Losartan ok  Vit d  [place] Maintenance  Topic Date Due   FOOT EXAM  Never done   OPHTHALMOLOGY EXAM  Never done   Diabetic kidney evaluation - Urine ACR  Never done   MAMMOGRAM  05/27/2017   COVID-19 Vaccine (4 - Mixed Product risk series) 05/20/2021   HEMOGLOBIN A1C  11/10/2021   Diabetic kidney evaluation - GFR measurement  05/10/2022   COLONOSCOPY (Pts 45-58yr Insurance coverage will need to be confirmed)  01/04/2023   Medicare Annual Wellness (AWV)  06/22/2023   TETANUS/TDAP  11/08/2028   Pneumonia Vaccine 67 Years old  Completed   INFLUENZA VACCINE  Completed   DEXA SCAN  Completed   Hepatitis C Screening  Completed   Zoster Vaccines- Shingrix  Completed   HPV VACCINES  Aged Out   Health Maintenance Review LIFESTYLE:  Exercise:   Tobacco/ETS: Alcohol:  3-4 per week  Sugar beverages:  sweet tea  3-5 cups per day.  Sleep: ok  but witch  Drug use: no HH of   2 2 cats      ROS:  G REST of 12 system review negative except as per HPI   Past Medical History:  Diagnosis Date   Allergy    Anxiety    Cataract    both eyes   Chicken pox    COPD (chronic obstructive pulmonary disease) (HCC)    Depression    Glaucoma    Hamstring injury    torn   Hx of thyroid disease    3rd grade  resolved    Hypertension    Ingestion of substance    3 04   Osteoporosis    Shift work sleep disorder     Past Surgical History:  Procedure Laterality Date   ADENOIDECTOMY  1959   BREAST BIOPSY Right 2019   COLONOSCOPY      Family History  Problem Relation Age of Onset   Cancer Mother 550      Died at 639of Non-Hodgkins Lymphoma   Lymphoma Mother        178  Colon polyps Mother        "many"; required regular colonoscopies   Alcohol abuse Father    COPD Father    Depression Father    Hyperlipidemia Father     Hypertension Father        deceased   Colon polyps Father        "many"; required regular colonoscopies   Colon cancer Sister        dx. 571-56  Other Sister        +CHEK2 mutation   COPD Maternal Aunt    Ovarian cancer Maternal Aunt 62   Stroke Maternal Grandmother    Heart Problems Maternal Grandmother    Cancer Maternal Grandfather [phone]      Brain Cancer   Arthritis Paternal Grandmother    Depression Paternal Grandmother    Glaucoma Paternal Grandmother    Breast cancer Paternal Grandmother        dx. 50s   Uterine cancer Paternal Grandmother        dx. 60s   Renal cancer Paternal Grandfather        smoker   Multiple myeloma Maternal Uncle  dx. well before his 44s   Ovarian cancer Other    Esophageal cancer Neg Hx    Stomach cancer Neg Hx    Rectal cancer Neg Hx     Social History   Socioeconomic History   Marital status: Married    Spouse name: Not on file   Number of children: Not on file   Years of education: Not on file   Highest education level: Not on file  Occupational History   Not on file  Tobacco Use   Smoking status: Every Day    Packs/day: 0.50    Types: Cigarettes   Smokeless tobacco: Never  Vaping Use   Vaping Use: Never used  Substance and Sexual Activity   Alcohol use: Yes    Comment: occasional wine   Drug use: No   Sexual activity: Not on file  Other Topics Concern   Not on file  Social History Narrative   Averages 5-8 hours of sleep per day.   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   [id number]      Social Determinants of Health   Financial Resource Strain: Low Risk  (05/22/2022)   Overall Financial Resource Strain (CARDIA)    Difficulty of Paying Living Expenses: Not hard at all  Food Insecurity: No Food Insecurity (05/22/2022)   Hunger Vital Sign    Worried About Running Out of  Food in the Last Year: Never true    Ran Out of Food in the Last Year: Never true  Transportation Needs: No Transportation Needs (05/22/2022)   PRAPARE - Hydrologist (Medical): No    Lack of Transportation (Non-Medical): No  Physical Activity: Inactive (05/22/2022)   Exercise Vital Sign    Days of Exercise per Week: 0 days    Minutes of Exercise per Session: 0 min  Stress: No Stress Concern Present (05/22/2022)   [place]    Feeling of Stress : Only a little  Social Connections: Moderately Isolated (05/18/2021)   Social Connection and Isolation Panel [NHANES]    Frequency of Communication with Friends and Family: Twice a week    Frequency of Social Gatherings with Friends and Family: Twice a week    Attends Religious Services: Never    Marine scientist or Organizations: No    Attends Music therapist: Never    Marital Status: Married    Outpatient Medications Prior to Visit  Medication Sig Dispense Refill   ADDERALL XR 30 MG 24 hr capsule      amphetamine-dextroamphetamine (ADDERALL) 20 MG tablet Take 20 mg by mouth daily.     amphetamine-dextroamphetamine (ADDERALL) 20 MG tablet Take one tablet by mouth in the morning and one tablet at noon 180 tablet 0   calcium citrate-vitamin D (CITRACAL+D) 315-200 MG-UNIT tablet Take 1 tablet by mouth 2 (two) times daily.     CHANTIX STARTING MONTH PAK 0.5 MG X 11 & 1 MG X 42 tablet  (Patient not taking: Reported on 05/22/2022)     chlordiazePOXIDE (LIBRIUM) 5 MG capsule Take 5 mg by mouth 2 (two) times daily.   0   Cholecalciferol (VITAMIN D PO) Take by mouth.     clobetasol cream (  TEMOVATE) 0.05 % Apply twice daily to the affected areas 60 g 1   clobetasol cream (TEMOVATE) 0.05 % Apply to affected areas twice daily. 60 g 1   cloNIDine (CATAPRES) 0.1 MG tablet Take 2 to 3 tablets by mouth at bedtime 270 tablet 1   cycloSPORINE  (RESTASIS) 0.05 % ophthalmic emulsion Place 1 drop into both eyes twice a day 180 each 3   cycloSPORINE, PF, (CEQUA) 0.09 % SOLN Instill 1 drop into both eyes twice a day 180 each 3   escitalopram (LEXAPRO) 20 MG tablet Take 1 tablet by mouth daily. 30 tablet 2   fluocinonide ointment (LIDEX) 9.73 % Apply 1 application topically 2 (two) times daily. Please mail to patient 30 g 1   FLUoxetine (PROZAC) 40 MG capsule fluoxetine 40 mg capsule     FLUoxetine (PROZAC) 40 MG capsule TAKE 1 CAPSULE BY MOUTH ONCE A DAY 90 capsule 4   FLUoxetine (PROZAC) 40 MG capsule Take 1 capsule by mouth once a day. 90 capsule 0   gabapentin (NEURONTIN) 400 MG capsule TAKE 3-4 CAPSULES BY MOUTH EVERY EVENING 360 capsule 4   gabapentin (NEURONTIN) 400 MG capsule Take 6 capsules by mouth at bedtime 720 capsule 4   gabapentin (NEURONTIN) 400 MG capsule Take 6 capsules by moth at bedtime 720 capsule 4   gabapentin (NEURONTIN) 400 MG capsule Take 6 capsules (2,400 mg total) by mouth at bedtime. 720 capsule 4   hydrochlorothiazide (HYDRODIURIL) 12.5 MG tablet Take 1 tablet (12.5 mg total) by mouth daily. 30 tablet 0   losartan (COZAAR) 100 MG tablet Take 1 tablet (100 mg total) by mouth daily. SCHEDULE APPT FOR FUTURE REFILLS 90 tablet 0   RESTASIS 0.05 % ophthalmic emulsion      Suvorexant (BELSOMRA) 15 MG TABS Take 1 tablet by mouth at bedtime 90 tablet 1   Suvorexant (BELSOMRA) 20 MG TABS Take 1 tablet by mouth at bedtime 90 tablet 1   Vilazodone HCl (VIIBRYD) 20 MG TABS Take 1 tablet by mouth daily with a full meal 90 tablet 0   Vitamin D, Ergocalciferol, (DRISDOL) 1.25 MG (50000 UT) CAPS capsule Take 1 capsule (50,000 Units total) by mouth every 7 (seven) days. 12 capsule 0   vortioxetine HBr (TRINTELLIX) 20 MG TABS tablet Take 20 mg by mouth daily.     Zinc Sulfate (ZINC 15 PO) Take 1 tablet by mouth daily.     zolpidem (AMBIEN) 10 MG tablet Take 10 mg by mouth at bedtime as needed for sleep.     zolpidem (AMBIEN) 10  MG tablet TAKE 1 TABLET BY MOUTH EVERY EVENING AS NEEDED 90 tablet 0   zolpidem (AMBIEN) 10 MG tablet TAKE 1 TABLET BY MOUTH EVERY EVENING AT BEDTIME AS NEEDED. 90 tablet 0   Facility-Administered Medications Prior to Visit  Medication Dose Route Frequency Provider Last Rate Last Admin   0.9 %  sodium chloride infusion  500 mL Intravenous Once Nandigam, Venia Minks, MD         EXAM:  There were no vitals taken for this visit.  There is no height or weight on file to calculate BMI. Wt Readings from Last 3 Encounters:  05/22/22 115 lb (52.2 kg)  08/23/21 120 lb (54.4 kg)  05/18/21 117 lb (53.1 kg)    Physical Exam: Vital signs reviewed ZHG:DJME is a well-developed well-nourished alert cooperative    who appearsr stated age in no acute distress.  HEENT: normocephalic atraumatic , Eyes: PERRL EOM's full, conjunctiva clear,  Nares: paten,t no deformity discharge or tenderness., Ears: no deformity EAC's clear TMs with normal landmarks. Mouth: clear OP, no lesions, edema.  Moist mucous membranes. Dentition in adequate repair. NECK: supple without masses, thyromegaly or bruits. CHEST/PULM:  Clear to auscultation and percussion breath sounds equal no wheeze , rales or rhonchi. No chest wall deformities or tenderness. Breast: normal by inspection . No dimpling, discharge, masses, tenderness or discharge . CV: PMI is nondisplaced, S1 S2 no gallops, murmurs, rubs. Peripheral pulses are full without delay.No JVD .  ABDOMEN: Bowel sounds normal nontender  No guard or rebound, no hepato splenomegal no CVA tenderness.  No hernia. Extremtities:  No clubbing cyanosis or edema, no acute joint swelling or redness no focal atrophy NEURO:  Oriented x3, cranial nerves 3-12 appear to be intact, no obvious focal weakness,gait within normal limits no abnormal reflexes or asymmetrical SKIN: No acute rashes normal turgor, color, no bruising or petechiae. PSYCH: Oriented, good eye contact, no obvious depression  anxiety, cognition and judgment appear normal. LN: no cervical axillary inguinal adenopathy  Lab Results  Component Value Date   WBC 6.7 05/10/2021   HGB 13.4 05/10/2021   HCT 40.1 05/10/2021   PLT 304.0 05/10/2021   GLUCOSE 115 (H) 05/10/2021   CHOL 208 (H) 05/10/2021   TRIG 90.0 05/10/2021   HDL 93.30 05/10/2021   LDLCALC 96 05/10/2021   ALT 17 05/10/2021   AST 20 05/10/2021   NA 137 05/10/2021   K 4.1 05/10/2021   CL 100 05/10/2021   CREATININE 0.86 05/10/2021   BUN 25 (H) 05/10/2021   CO2 29 05/10/2021   TSH 2.23 05/10/2021   HGBA1C 5.7 05/10/2021    BP Readings from Last 3 Encounters:  05/18/21 120/78  05/18/21 120/76  01/02/20 138/84    Lab results reviewed with patient   ASSESSMENT AND PLAN:  Discussed the following assessment and plan:    ICD-10-CM   1. Visit for preventive health examination  Z00.00     2. Medication management  Z79.899     3. Essential hypertension  I10     4. Tobacco dependence  F17.200      No follow-ups on file.  Patient Care Team: Kamaya Keckler, Standley Brooking, MD as PCP - Lillia Pauls, MD as Consulting Physician (Ophthalmology) Chucky May, MD as Consulting Physician (Psychiatry) There are no Patient Instructions on file for this visit.  Standley Brooking. Joell Buerger M.D.

## 2022-07-19 NOTE — Patient Instructions (Addendum)
Good to see you today  Make sure getting Vit d in food or supplement Stopping tobacco and limiting sugar drinks .  Consider getting rsv vaccine.   Try hydroxizine at night instead of benadryl  if ok with Dr Toy Care .  Lab today .

## 2022-07-20 NOTE — Progress Notes (Signed)
Cholesterol up some from last year blood sugars in the prediabetic range but no diabetes kidney function liver all normal Vitamin D is low normal.   I suggest adding extra vitamin D supplement to get into a better range because of bone health protection.  Add perhaps 1000 units of vitamin D a day in addition to what ever you are doing now. Also decreasing the simple sugars

## 2022-07-21 ENCOUNTER — Other Ambulatory Visit (HOSPITAL_COMMUNITY): Payer: Self-pay

## 2022-08-07 ENCOUNTER — Other Ambulatory Visit (HOSPITAL_COMMUNITY): Payer: Self-pay

## 2022-08-07 ENCOUNTER — Other Ambulatory Visit: Payer: Self-pay | Admitting: Internal Medicine

## 2022-08-07 MED ORDER — AMPHETAMINE-DEXTROAMPHETAMINE 20 MG PO TABS
20.0000 mg | ORAL_TABLET | Freq: Every morning | ORAL | 0 refills | Status: DC
  Filled 2022-08-07: qty 20, 10d supply, fill #0

## 2022-08-07 MED ORDER — ESCITALOPRAM OXALATE 20 MG PO TABS
20.0000 mg | ORAL_TABLET | Freq: Every day | ORAL | 0 refills | Status: DC
  Filled 2022-08-07: qty 30, 30d supply, fill #0

## 2022-08-08 ENCOUNTER — Other Ambulatory Visit (HOSPITAL_COMMUNITY): Payer: Self-pay

## 2022-08-09 ENCOUNTER — Other Ambulatory Visit (HOSPITAL_COMMUNITY): Payer: Self-pay

## 2022-08-09 MED ORDER — HYDROCHLOROTHIAZIDE 12.5 MG PO TABS
12.5000 mg | ORAL_TABLET | Freq: Every day | ORAL | 0 refills | Status: DC
  Filled 2022-08-09: qty 90, 90d supply, fill #0

## 2022-08-11 ENCOUNTER — Other Ambulatory Visit (HOSPITAL_COMMUNITY): Payer: Self-pay

## 2022-08-14 ENCOUNTER — Other Ambulatory Visit: Payer: Self-pay | Admitting: Internal Medicine

## 2022-08-15 ENCOUNTER — Other Ambulatory Visit (HOSPITAL_COMMUNITY): Payer: Self-pay

## 2022-08-15 MED ORDER — HYDROXYZINE PAMOATE 25 MG PO CAPS
25.0000 mg | ORAL_CAPSULE | Freq: Three times a day (TID) | ORAL | 1 refills | Status: DC | PRN
  Filled 2022-08-15: qty 30, 10d supply, fill #0
  Filled 2022-09-11: qty 30, 10d supply, fill #1

## 2022-08-18 ENCOUNTER — Other Ambulatory Visit (HOSPITAL_COMMUNITY): Payer: Self-pay

## 2022-08-22 ENCOUNTER — Other Ambulatory Visit (HOSPITAL_COMMUNITY): Payer: Self-pay

## 2022-08-22 DIAGNOSIS — F331 Major depressive disorder, recurrent, moderate: Secondary | ICD-10-CM | POA: Diagnosis not present

## 2022-08-22 DIAGNOSIS — F411 Generalized anxiety disorder: Secondary | ICD-10-CM | POA: Diagnosis not present

## 2022-08-22 DIAGNOSIS — G4700 Insomnia, unspecified: Secondary | ICD-10-CM | POA: Diagnosis not present

## 2022-08-22 DIAGNOSIS — F900 Attention-deficit hyperactivity disorder, predominantly inattentive type: Secondary | ICD-10-CM | POA: Diagnosis not present

## 2022-08-23 ENCOUNTER — Ambulatory Visit (HOSPITAL_BASED_OUTPATIENT_CLINIC_OR_DEPARTMENT_OTHER)
Admission: RE | Admit: 2022-08-23 | Discharge: 2022-08-23 | Disposition: A | Discharge status: Home / Self Care | Payer: PPO | Source: Ambulatory Visit | Attending: Acute Care | Admitting: Acute Care

## 2022-08-23 ENCOUNTER — Other Ambulatory Visit (HOSPITAL_COMMUNITY): Payer: Self-pay

## 2022-08-23 DIAGNOSIS — F17210 Nicotine dependence, cigarettes, uncomplicated: Secondary | ICD-10-CM | POA: Diagnosis not present

## 2022-08-23 DIAGNOSIS — Z87891 Personal history of nicotine dependence: Secondary | ICD-10-CM

## 2022-08-23 DIAGNOSIS — Z122 Encounter for screening for malignant neoplasm of respiratory organs: Secondary | ICD-10-CM | POA: Insufficient documentation

## 2022-08-23 DIAGNOSIS — I2510 Atherosclerotic heart disease of native coronary artery without angina pectoris: Secondary | ICD-10-CM | POA: Diagnosis not present

## 2022-08-23 DIAGNOSIS — I700 Atherosclerosis of aorta: Secondary | ICD-10-CM | POA: Insufficient documentation

## 2022-08-23 DIAGNOSIS — J439 Emphysema, unspecified: Secondary | ICD-10-CM | POA: Diagnosis not present

## 2022-08-23 MED ORDER — AMPHETAMINE-DEXTROAMPHETAMINE 20 MG PO TABS
20.0000 mg | ORAL_TABLET | Freq: Two times a day (BID) | ORAL | 0 refills | Status: DC
  Filled 2022-08-23: qty 180, 90d supply, fill #0

## 2022-08-23 MED ORDER — AUVELITY 45-105 MG PO TBCR
1.0000 | EXTENDED_RELEASE_TABLET | Freq: Two times a day (BID) | ORAL | 11 refills | Status: DC
  Filled 2022-08-23: qty 60, 30d supply, fill #0

## 2022-08-23 MED ORDER — BELSOMRA 20 MG PO TABS
1.0000 | ORAL_TABLET | Freq: Every day | ORAL | 1 refills | Status: DC
  Filled 2022-08-23: qty 90, 90d supply, fill #0
  Filled 2022-11-21: qty 90, 90d supply, fill #1

## 2022-08-24 ENCOUNTER — Other Ambulatory Visit (HOSPITAL_COMMUNITY): Payer: Self-pay

## 2022-08-25 ENCOUNTER — Other Ambulatory Visit: Payer: Self-pay | Admitting: Acute Care

## 2022-08-25 DIAGNOSIS — Z122 Encounter for screening for malignant neoplasm of respiratory organs: Secondary | ICD-10-CM

## 2022-08-25 DIAGNOSIS — F17210 Nicotine dependence, cigarettes, uncomplicated: Secondary | ICD-10-CM

## 2022-08-25 DIAGNOSIS — Z87891 Personal history of nicotine dependence: Secondary | ICD-10-CM

## 2022-08-29 ENCOUNTER — Other Ambulatory Visit (HOSPITAL_COMMUNITY): Payer: Self-pay

## 2022-08-30 ENCOUNTER — Other Ambulatory Visit (HOSPITAL_COMMUNITY): Payer: Self-pay

## 2022-08-31 ENCOUNTER — Other Ambulatory Visit (HOSPITAL_COMMUNITY): Payer: Self-pay

## 2022-08-31 MED ORDER — CLONIDINE HCL 0.1 MG PO TABS
0.2000 mg | ORAL_TABLET | Freq: Every evening | ORAL | 1 refills | Status: DC
  Filled 2022-08-31: qty 270, 90d supply, fill #0
  Filled 2022-11-24: qty 270, 90d supply, fill #1

## 2022-09-09 ENCOUNTER — Other Ambulatory Visit (HOSPITAL_COMMUNITY): Payer: Self-pay

## 2022-09-09 MED ORDER — TRINTELLIX 20 MG PO TABS
20.0000 mg | ORAL_TABLET | Freq: Every day | ORAL | 0 refills | Status: DC
  Filled 2022-09-09 – 2022-09-11 (×2): qty 30, 30d supply, fill #0

## 2022-09-11 ENCOUNTER — Other Ambulatory Visit: Payer: Self-pay

## 2022-09-11 ENCOUNTER — Other Ambulatory Visit (HOSPITAL_COMMUNITY): Payer: Self-pay

## 2022-09-12 ENCOUNTER — Other Ambulatory Visit: Payer: Self-pay

## 2022-09-12 ENCOUNTER — Other Ambulatory Visit (HOSPITAL_COMMUNITY): Payer: Self-pay

## 2022-09-20 ENCOUNTER — Other Ambulatory Visit (HOSPITAL_COMMUNITY): Payer: Self-pay

## 2022-09-26 ENCOUNTER — Other Ambulatory Visit: Payer: Self-pay

## 2022-09-26 ENCOUNTER — Other Ambulatory Visit (HOSPITAL_COMMUNITY): Payer: Self-pay

## 2022-09-28 ENCOUNTER — Other Ambulatory Visit: Payer: Self-pay

## 2022-09-28 ENCOUNTER — Other Ambulatory Visit (HOSPITAL_COMMUNITY): Payer: Self-pay

## 2022-09-28 MED ORDER — CLOBETASOL PROPIONATE 0.05 % EX CREA
1.0000 | TOPICAL_CREAM | Freq: Two times a day (BID) | CUTANEOUS | 1 refills | Status: DC
  Filled 2022-09-28: qty 60, 30d supply, fill #0
  Filled 2022-11-11: qty 60, 30d supply, fill #1

## 2022-10-10 ENCOUNTER — Other Ambulatory Visit: Payer: Self-pay

## 2022-10-10 ENCOUNTER — Other Ambulatory Visit: Payer: Self-pay | Admitting: Internal Medicine

## 2022-10-10 ENCOUNTER — Other Ambulatory Visit (HOSPITAL_COMMUNITY): Payer: Self-pay

## 2022-10-10 MED ORDER — VORTIOXETINE HBR 20 MG PO TABS
20.0000 mg | ORAL_TABLET | Freq: Every day | ORAL | 2 refills | Status: DC
  Filled 2022-10-10 – 2022-10-11 (×2): qty 30, 30d supply, fill #0
  Filled 2023-02-17 – 2023-03-28 (×3): qty 30, 30d supply, fill #1

## 2022-10-10 MED ORDER — LOSARTAN POTASSIUM 100 MG PO TABS
100.0000 mg | ORAL_TABLET | Freq: Every day | ORAL | 0 refills | Status: DC
  Filled 2022-10-10: qty 90, 90d supply, fill #0

## 2022-10-11 ENCOUNTER — Other Ambulatory Visit: Payer: Self-pay

## 2022-10-11 ENCOUNTER — Other Ambulatory Visit (HOSPITAL_COMMUNITY): Payer: Self-pay

## 2022-10-13 ENCOUNTER — Other Ambulatory Visit (HOSPITAL_COMMUNITY): Payer: Self-pay

## 2022-10-13 MED ORDER — TRINTELLIX 20 MG PO TABS
20.0000 mg | ORAL_TABLET | Freq: Every day | ORAL | 1 refills | Status: DC
  Filled 2022-11-11 – 2022-11-17 (×2): qty 90, 90d supply, fill #0

## 2022-10-14 ENCOUNTER — Other Ambulatory Visit (HOSPITAL_COMMUNITY): Payer: Self-pay

## 2022-11-11 ENCOUNTER — Other Ambulatory Visit: Payer: Self-pay | Admitting: Internal Medicine

## 2022-11-13 ENCOUNTER — Other Ambulatory Visit: Payer: Self-pay

## 2022-11-13 ENCOUNTER — Encounter: Payer: Self-pay | Admitting: Pharmacist

## 2022-11-13 ENCOUNTER — Other Ambulatory Visit (HOSPITAL_COMMUNITY): Payer: Self-pay

## 2022-11-13 DIAGNOSIS — Z681 Body mass index (BMI) 19 or less, adult: Secondary | ICD-10-CM | POA: Diagnosis not present

## 2022-11-13 DIAGNOSIS — Z01419 Encounter for gynecological examination (general) (routine) without abnormal findings: Secondary | ICD-10-CM | POA: Diagnosis not present

## 2022-11-13 DIAGNOSIS — Z1231 Encounter for screening mammogram for malignant neoplasm of breast: Secondary | ICD-10-CM | POA: Diagnosis not present

## 2022-11-13 MED ORDER — HYDROCHLOROTHIAZIDE 12.5 MG PO TABS
12.5000 mg | ORAL_TABLET | Freq: Every day | ORAL | 0 refills | Status: DC
  Filled 2022-11-13: qty 90, 90d supply, fill #0

## 2022-11-17 ENCOUNTER — Other Ambulatory Visit: Payer: Self-pay

## 2022-11-17 ENCOUNTER — Other Ambulatory Visit (HOSPITAL_COMMUNITY): Payer: Self-pay

## 2022-11-21 ENCOUNTER — Other Ambulatory Visit (HOSPITAL_COMMUNITY): Payer: Self-pay

## 2022-11-21 ENCOUNTER — Other Ambulatory Visit: Payer: Self-pay

## 2022-11-22 ENCOUNTER — Other Ambulatory Visit: Payer: Self-pay

## 2022-11-22 ENCOUNTER — Other Ambulatory Visit (HOSPITAL_COMMUNITY): Payer: Self-pay

## 2022-11-22 ENCOUNTER — Encounter (HOSPITAL_COMMUNITY): Payer: Self-pay

## 2022-11-22 MED ORDER — AMPHETAMINE-DEXTROAMPHETAMINE 20 MG PO TABS
20.0000 mg | ORAL_TABLET | Freq: Two times a day (BID) | ORAL | 0 refills | Status: DC
  Filled 2022-11-22: qty 180, 90d supply, fill #0

## 2022-11-24 ENCOUNTER — Other Ambulatory Visit (HOSPITAL_COMMUNITY): Payer: Self-pay

## 2022-11-24 ENCOUNTER — Other Ambulatory Visit: Payer: Self-pay

## 2022-11-27 ENCOUNTER — Other Ambulatory Visit: Payer: Self-pay

## 2022-11-30 DIAGNOSIS — R140 Abdominal distension (gaseous): Secondary | ICD-10-CM | POA: Diagnosis not present

## 2022-12-04 ENCOUNTER — Encounter: Payer: Self-pay | Admitting: Gastroenterology

## 2022-12-22 ENCOUNTER — Other Ambulatory Visit (HOSPITAL_COMMUNITY): Payer: Self-pay

## 2022-12-23 ENCOUNTER — Other Ambulatory Visit (HOSPITAL_COMMUNITY): Payer: Self-pay

## 2022-12-23 MED ORDER — CLOBETASOL PROPIONATE 0.05 % EX CREA
1.0000 | TOPICAL_CREAM | Freq: Two times a day (BID) | CUTANEOUS | 1 refills | Status: DC
  Filled 2022-12-23: qty 60, 30d supply, fill #0
  Filled 2023-01-30: qty 60, 30d supply, fill #1

## 2022-12-26 ENCOUNTER — Other Ambulatory Visit (HOSPITAL_COMMUNITY): Payer: Self-pay

## 2022-12-26 ENCOUNTER — Ambulatory Visit (AMBULATORY_SURGERY_CENTER): Payer: PPO | Admitting: *Deleted

## 2022-12-26 VITALS — Ht 65.0 in | Wt 117.0 lb

## 2022-12-26 DIAGNOSIS — Z800 Family history of malignant neoplasm of digestive organs: Secondary | ICD-10-CM

## 2022-12-26 MED ORDER — SUPREP BOWEL PREP KIT 17.5-3.13-1.6 GM/177ML PO SOLN
1.0000 | Freq: Once | ORAL | 0 refills | Status: DC
  Filled 2022-12-26: qty 354, 2d supply, fill #0

## 2022-12-26 NOTE — Progress Notes (Signed)
Pre visit complete over telephone. Instructions forwarded through [facility].   No egg or soy allergy known to patient  No issues known to pt with past sedation with any surgeries or procedures Patient denies ever being told they had issues or difficulty with intubation  No FH of Malignant Hyperthermia Pt is not on diet pills Pt is not on  home 02  Pt is not on blood thinners  Pt denies issues with constipation  No A fib or A flutter Have any cardiac testing pending-- NO Pt instructed to use [email or web address] or GoodRx for a price reduction on prep

## 2022-12-27 ENCOUNTER — Other Ambulatory Visit: Payer: Self-pay | Admitting: Gastroenterology

## 2022-12-27 ENCOUNTER — Other Ambulatory Visit (HOSPITAL_COMMUNITY): Payer: Self-pay

## 2022-12-27 ENCOUNTER — Other Ambulatory Visit: Payer: Self-pay

## 2022-12-27 DIAGNOSIS — Z800 Family history of malignant neoplasm of digestive organs: Secondary | ICD-10-CM

## 2022-12-28 ENCOUNTER — Other Ambulatory Visit: Payer: Self-pay

## 2022-12-28 MED ORDER — NA SULFATE-K SULFATE-MG SULF 17.5-3.13-1.6 GM/177ML PO SOLN
1.0000 | Freq: Once | ORAL | 0 refills | Status: AC
  Filled 2022-12-28: qty 354, 1d supply, fill #0

## 2023-01-01 ENCOUNTER — Other Ambulatory Visit (HOSPITAL_COMMUNITY): Payer: Self-pay

## 2023-01-01 ENCOUNTER — Other Ambulatory Visit: Payer: Self-pay

## 2023-01-10 ENCOUNTER — Other Ambulatory Visit: Payer: Self-pay

## 2023-01-10 ENCOUNTER — Other Ambulatory Visit: Payer: Self-pay | Admitting: Internal Medicine

## 2023-01-10 MED ORDER — LOSARTAN POTASSIUM 100 MG PO TABS
100.0000 mg | ORAL_TABLET | Freq: Every day | ORAL | 0 refills | Status: DC
  Filled 2023-01-10: qty 90, 90d supply, fill #0

## 2023-01-12 ENCOUNTER — Encounter: Payer: Self-pay | Admitting: Gastroenterology

## 2023-01-15 ENCOUNTER — Other Ambulatory Visit: Payer: Self-pay

## 2023-01-26 ENCOUNTER — Ambulatory Visit (AMBULATORY_SURGERY_CENTER): Payer: PPO | Admitting: Gastroenterology

## 2023-01-26 ENCOUNTER — Encounter: Payer: Self-pay | Admitting: Gastroenterology

## 2023-01-26 VITALS — BP 127/76 | HR 74 | Temp 98.4°F | Resp 12 | Ht 64.0 in | Wt 117.0 lb

## 2023-01-26 DIAGNOSIS — D125 Benign neoplasm of sigmoid colon: Secondary | ICD-10-CM | POA: Diagnosis not present

## 2023-01-26 DIAGNOSIS — Z1211 Encounter for screening for malignant neoplasm of colon: Secondary | ICD-10-CM

## 2023-01-26 DIAGNOSIS — F32A Depression, unspecified: Secondary | ICD-10-CM | POA: Diagnosis not present

## 2023-01-26 DIAGNOSIS — Z800 Family history of malignant neoplasm of digestive organs: Secondary | ICD-10-CM

## 2023-01-26 DIAGNOSIS — I10 Essential (primary) hypertension: Secondary | ICD-10-CM | POA: Diagnosis not present

## 2023-01-26 DIAGNOSIS — D122 Benign neoplasm of ascending colon: Secondary | ICD-10-CM

## 2023-01-26 DIAGNOSIS — J449 Chronic obstructive pulmonary disease, unspecified: Secondary | ICD-10-CM | POA: Diagnosis not present

## 2023-01-26 DIAGNOSIS — F419 Anxiety disorder, unspecified: Secondary | ICD-10-CM | POA: Diagnosis not present

## 2023-01-26 MED ORDER — SODIUM CHLORIDE 0.9 % IV SOLN: 500.0000 mL | Freq: Once | INTRAVENOUS | Status: DC

## 2023-01-26 NOTE — Progress Notes (Unsigned)
[facility] History and Physical   Primary Care Physician:  Madelin Headings, MD   Reason for Procedure:  History of adenomatous colon polyps and family h/o colon cancer  Plan:    Surveillance colonoscopy with possible interventions as needed     HPI: Jasmine Nelson is a very pleasant 67 y.o. female here for surveillance colonoscopy. Denies any nausea, vomiting, abdominal pain, melena or bright red blood per rectum  The risks and benefits as well as alternatives of endoscopic procedure(s) have been discussed and reviewed. All questions answered. The patient agrees to proceed.    Past Medical History:  Diagnosis Date   Allergy    Anxiety    Cataract    both eyes   Chicken pox    COPD (chronic obstructive pulmonary disease) (HCC)    Depression    Glaucoma    Hx of thyroid disease    3rd grade  resolved    Hypertension    Ingestion of substance    3 04   Osteoporosis    Shift work sleep disorder     Past Surgical History:  Procedure Laterality Date   ADENOIDECTOMY  1959   BREAST BIOPSY Right 2019   COLONOSCOPY      Prior to Admission medications   Medication Sig Start Date End Date Taking? Authorizing Provider  amphetamine-dextroamphetamine (ADDERALL) 20 MG tablet Take one tablet by mouth in the morning and one tablet at noon 11/01/21  Yes   Ca Phosphate-Cholecalciferol (CALCIUM WITH D3 PO) Take by mouth.   Yes [provider]  calcium citrate-vitamin D (CITRACAL+D) 315-200 MG-UNIT tablet Take 1 tablet by mouth 2 (two) times daily.   Yes [provider]  clobetasol cream (TEMOVATE) 0.05 % Apply 1 Application topically 2 (two) times daily. 12/23/22  Yes   cloNIDine (CATAPRES) 0.1 MG tablet Take 2 - 3 tablets by mouth at bedtime. 08/31/22  Yes   gabapentin (NEURONTIN) 400 MG capsule Take 6 capsules (2,400 mg total) by mouth at bedtime. 03/15/22  Yes   hydrochlorothiazide (HYDRODIURIL) 12.5 MG tablet Take 1 tablet (12.5 mg total) by mouth daily.  11/13/22  Yes Worthy Rancher B, FNP  hydrOXYzine (VISTARIL) 25 MG capsule Take 1 capsule (25 mg total) by mouth every 8 (eight) hours as needed for itching 08/15/22  Yes Panosh, Neta Mends, MD  losartan (COZAAR) 100 MG tablet Take 1 tablet (100 mg total) by mouth daily. 01/10/23 01/10/24 Yes Panosh, Neta Mends, MD  Probiotic Product (ALIGN PO) Take by mouth.   Yes [provider]  Suvorexant (BELSOMRA) 20 MG TABS Take 1 tablet by mouth at bedtime 08/22/22  Yes   vortioxetine HBr (TRINTELLIX) 20 MG TABS tablet Take 1 tablet (20 mg total) by mouth daily. 10/10/22  Yes   CHANTIX STARTING MONTH PAK 0.5 MG X 11 & 1 MG X 42 tablet  09/10/19   [provider]  cycloSPORINE (RESTASIS) 0.05 % ophthalmic emulsion Place 1 drop into both eyes twice a day 09/06/21     Zinc Sulfate (ZINC 15 PO) Take 1 tablet by mouth daily.    [provider]    Current Outpatient Medications  Medication Sig Dispense Refill   amphetamine-dextroamphetamine (ADDERALL) 20 MG tablet Take one tablet by mouth in the morning and one tablet at noon 180 tablet 0   Ca Phosphate-Cholecalciferol (CALCIUM WITH D3 PO) Take by mouth.     calcium citrate-vitamin D (CITRACAL+D) 315-200 MG-UNIT tablet Take 1 tablet by mouth 2 (two) times daily.  clobetasol cream (TEMOVATE) 0.05 % Apply 1 Application topically 2 (two) times daily. 60 g 1   cloNIDine (CATAPRES) 0.1 MG tablet Take 2 - 3 tablets by mouth at bedtime. 270 tablet 1   gabapentin (NEURONTIN) 400 MG capsule Take 6 capsules (2,400 mg total) by mouth at bedtime. 720 capsule 4   hydrochlorothiazide (HYDRODIURIL) 12.5 MG tablet Take 1 tablet (12.5 mg total) by mouth daily. 90 tablet 0   hydrOXYzine (VISTARIL) 25 MG capsule Take 1 capsule (25 mg total) by mouth every 8 (eight) hours as needed for itching 30 capsule 1   losartan (COZAAR) 100 MG tablet Take 1 tablet (100 mg total) by mouth daily. 90 tablet 0   Probiotic Product (ALIGN PO) Take by mouth.     Suvorexant  (BELSOMRA) 20 MG TABS Take 1 tablet by mouth at bedtime 90 tablet 1   vortioxetine HBr (TRINTELLIX) 20 MG TABS tablet Take 1 tablet (20 mg total) by mouth daily. 30 tablet 2   CHANTIX STARTING MONTH PAK 0.5 MG X 11 & 1 MG X 42 tablet  (Patient not taking: Reported on 05/22/2022)     cycloSPORINE (RESTASIS) 0.05 % ophthalmic emulsion Place 1 drop into both eyes twice a day 180 each 3   Zinc Sulfate (ZINC 15 PO) Take 1 tablet by mouth daily.     Current Facility-Administered Medications  Medication Dose Route Frequency Provider Last Rate Last Admin   0.9 %  sodium chloride infusion  500 mL Intravenous Once Jamil Armwood V, MD       0.9 %  sodium chloride infusion  500 mL Intravenous Once Napoleon Form, MD        Allergies as of 01/26/2023 - Review Complete 01/26/2023  Allergen Reaction Noted   Lisinopril Cough 03/05/2015    Family History  Problem Relation Age of Onset   Cancer Mother 31       Died at 48 of Non-Hodgkins Lymphoma   Lymphoma Mother        1997   Colon polyps Mother        "many"; required regular colonoscopies   Alcohol abuse Father    COPD Father    Depression Father    Hyperlipidemia Father    Hypertension Father        deceased   Colon polyps Father        "many"; required regular colonoscopies   Colon cancer Sister        dx. 26-56   Other Sister        +CHEK2 mutation   COPD Maternal Aunt    Ovarian cancer Maternal Aunt 62   Stroke Maternal Grandmother    Heart Problems Maternal Grandmother    Cancer Maternal Grandfather 52       Brain Cancer   Arthritis Paternal Grandmother    Depression Paternal Grandmother    Glaucoma Paternal Grandmother    Breast cancer Paternal Grandmother        dx. 50s   Uterine cancer Paternal Grandmother        dx. 60s   Renal cancer Paternal Grandfather        smoker   Multiple myeloma Maternal Uncle        dx. well before his 37s   Ovarian cancer Other    Esophageal cancer Neg Hx    Stomach cancer Neg Hx     Rectal cancer Neg Hx     Social History   Socioeconomic History   Marital status: Married  Spouse name: Not on file   Number of children: Not on file   Years of education: Not on file   Highest education level: Not on file  Occupational History   Not on file  Tobacco Use   Smoking status: Every Day    Packs/day: .5    Types: Cigarettes   Smokeless tobacco: Never  Vaping Use   Vaping Use: Some days  Substance and Sexual Activity   Alcohol use: Yes    Comment: occasional wine   Drug use: No   Sexual activity: Not on file  Other Topics Concern   Not on file  Social History Narrative   Averages 5-8 hours of sleep per day.   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   P0G0      Social Determinants of Health   Financial Resource Strain: Low Risk  (05/22/2022)   Overall Financial Resource Strain (CARDIA)    Difficulty of Paying Living Expenses: Not hard at all  Food Insecurity: No Food Insecurity (05/22/2022)   Hunger Vital Sign    Worried About Running Out of Food in the Last Year: Never true    Ran Out of Food in the Last Year: Never true  Transportation Needs: No Transportation Needs (05/22/2022)   PRAPARE - Administrator, Civil Service (Medical): No    Lack of Transportation (Non-Medical): No  Physical Activity: Inactive (05/22/2022)   Exercise Vital Sign    Days of Exercise per Week: 0 days    Minutes of Exercise per Session: 0 min  Stress: No Stress Concern Present (05/22/2022)   [employer] of Occupational Health - Occupational Stress Questionnaire    Feeling of Stress : Only a little  Social Connections: Moderately Isolated (05/18/2021)   Social Connection and Isolation Panel [NHANES]    Frequency of Communication with Friends and Family: Twice a week    Frequency of Social Gatherings with Friends  and Family: Twice a week    Attends Religious Services: Never    Database administrator or Organizations: No    Attends Banker Meetings: Never    Marital Status: Married  Catering manager Violence: Not At Risk (05/18/2021)   Humiliation, Afraid, Rape, and Kick questionnaire    Fear of Current or Ex-Partner: No    Emotionally Abused: No    Physically Abused: No    Sexually Abused: No    Review of Systems:  All other review of systems negative except as mentioned in the HPI.  Physical Exam: Vital signs in last 24 hours: Blood Pressure 139/82   Pulse 73   Temperature 98.4 F (36.9 C)   Height 5\' 4"  (1.626 m)   Weight 117 lb (53.1 kg)   Oxygen Saturation 98%   Body Mass Index 20.08 kg/m  General:   Alert, NAD Lungs:  Clear .   Heart:  Regular rate and rhythm Abdomen:  Soft, nontender and nondistended. Neuro/Psych:  Alert and cooperative. Normal mood and affect. A and O x 3  Reviewed labs, radiology imaging, old records and pertinent past GI work up  Patient is appropriate for planned procedure(s) and anesthesia in an ambulatory setting   K. Scherry Ran , MD [phone]

## 2023-01-26 NOTE — Patient Instructions (Addendum)
NO ASPIRIN, ASPIRIN CONTAINING PRODUCTS (BC OR GOODY POWDERS) OR NSAIDS (IBUPROFEN, ADVIL, ALEVE, AND MOTRIN) FOR; TYLENOL IS OK TO TAKE  YOU HAD AN ENDOSCOPIC PROCEDURE TODAY AT THE [facility]:   Refer to the procedure report that was given to you for any specific questions about what was found during the examination.  If the procedure report does not answer your questions, please call your gastroenterologist to clarify.  If you requested that your care partner not be given the details of your procedure findings, then the procedure report has been included in a sealed envelope for you to review at your convenience later.  YOU SHOULD EXPECT: Some feelings of bloating in the abdomen. Passage of more gas than usual.  Walking can help get rid of the air that was put into your GI tract during the procedure and reduce the bloating. If you had a lower endoscopy (such as a colonoscopy or flexible sigmoidoscopy) you may notice spotting of blood in your stool or on the toilet paper. If you underwent a bowel prep for your procedure, you may not have a normal bowel movement for a few days.  Please Note:  You might notice some irritation and congestion in your nose or some drainage.  This is from the oxygen used during your procedure.  There is no need for concern and it should clear up in a day or so.  SYMPTOMS TO REPORT IMMEDIATELY:  Following lower endoscopy (colonoscopy or flexible sigmoidoscopy):  Excessive amounts of blood in the stool  Significant tenderness or worsening of abdominal pains  Swelling of the abdomen that is new, acute  Fever of 100F or higher  For urgent or emergent issues, a gastroenterologist can be reached at any hour by calling [phone]. Do not use MyChart messaging for urgent concerns.    DIET:  We do recommend a small meal at first, but then you may proceed to your regular diet.  Drink plenty of fluids but you should avoid alcoholic beverages for 24  hours.  ACTIVITY:  You should plan to take it easy for the rest of today and you should NOT DRIVE or use heavy machinery until tomorrow (because of the sedation medicines used during the test).    FOLLOW UP: Our staff will call the number listed on your records the next business day following your procedure.  We will call around 7:15- 8:00 am to check on you and address any questions or concerns that you may have regarding the information given to you following your procedure. If we do not reach you, we will leave a message.     If any biopsies were taken you will be contacted by phone or by letter within the next 1-3 weeks.  Please call us at [phone] if you have not heard about the biopsies in 3 weeks.    SIGNATURES/CONFIDENTIALITY: You and/or your care partner have signed paperwork which will be entered into your electronic medical record.  These signatures attest to the fact that that the information above on your After Visit Summary has been reviewed and is understood.  Full responsibility of the confidentiality of this discharge information lies with you and/or your care-partner.

## 2023-01-26 NOTE — Progress Notes (Unsigned)
Uneventful anesthetic. Report to pacu rn. Vss. Care resumed by rn. 

## 2023-01-26 NOTE — Op Note (Signed)
[facility] Patient Name: Jasmine Nelson Procedure Date: 01/26/2023 10:49 AM MRN: [id number] Endoscopist: Napoleon Form , MD, [id number] Age: 67 Referring MD:  Date of Birth: [birth year] Gender: Female Account #: [id number] Procedure:                Colonoscopy Indications:              Screening in patient at increased risk: Family                            history of 1st-degree relative with colorectal                            cancer, High risk colon cancer surveillance:                            Personal history of colonic polyps, High risk colon                            cancer surveillance: Personal history of adenoma                            (10 mm or greater in size) Medicines:                Monitored Anesthesia Care Procedure:                Pre-Anesthesia Assessment:                           - Prior to the procedure, a History and Physical                            was performed, and patient medications and                            allergies were reviewed. The patient's tolerance of                            previous anesthesia was also reviewed. The risks                            and benefits of the procedure and the sedation                            options and risks were discussed with the patient.                            All questions were answered, and informed consent                            was obtained. Prior Anticoagulants: The patient has                            taken no anticoagulant or antiplatelet agents. ASA  Grade Assessment: II - A patient with mild systemic                            disease. After reviewing the risks and benefits,                            the patient was deemed in satisfactory condition to                            undergo the procedure.                           After obtaining informed consent, the colonoscope                            was passed under direct vision.  Throughout the                            procedure, the patient's blood pressure, pulse, and                            oxygen saturations were monitored continuously. The                            Olympus PCF-[id number] (#[id number]) Colonoscope was                            introduced through the anus and advanced to the the                            cecum, identified by appendiceal orifice and                            ileocecal valve. The colonoscopy was performed                            without difficulty. The patient tolerated the                            procedure well. The quality of the bowel                            preparation was good. The ileocecal valve,                            appendiceal orifice, and rectum were photographed. Scope In: 10:57:50 AM Scope Out: 11:13:38 AM Scope Withdrawal Time: 0 hours 11 minutes 10 seconds  Total Procedure Duration: 0 hours 15 minutes 48 seconds  Findings:                 The perianal and digital rectal examinations were                            normal.  Two small angioectasias with typical arborization                            were found in the ascending colon and in the cecum.                           Two sessile polyps were found in the sigmoid colon                            and ascending colon. The polyps were 4 to 8 mm in                            size. These polyps were removed with a cold snare.                            Resection and retrieval were complete.                           A few small-mouthed diverticula were found in the                            sigmoid colon.                           Non-bleeding external and internal hemorrhoids were                            found during retroflexion. The hemorrhoids were                            medium-sized. Complications:            No immediate complications. Estimated Blood Loss:     Estimated blood loss was minimal. Estimated  blood                            loss was minimal. Impression:               - Two colonic angioectasias.                           - Two 4 to 8 mm polyps in the sigmoid colon and in                            the ascending colon, removed with a cold snare.                            Resected and retrieved.                           - Diverticulosis in the sigmoid colon.                           - Non-bleeding external and internal hemorrhoids. Recommendation:           - Patient  has a contact number available for                            emergencies. The signs and symptoms of potential                            delayed complications were discussed with the                            patient. Return to normal activities tomorrow.                            Written discharge instructions were provided to the                            patient.                           - Resume previous diet.                           - Continue present medications.                           - Await pathology results.                           - Repeat colonoscopy for surveillance based on                            pathology results.                           - No ibuprofen, naproxen, or other non-steroidal                            anti-inflammatory drugs. Napoleon Form, MD 01/26/2023 11:23:49 AM This report has been signed electronically.

## 2023-01-26 NOTE — Progress Notes (Unsigned)
Called to room to assist during endoscopic procedure.  Patient ID and intended procedure confirmed with present staff. Received instructions for my participation in the procedure from the performing physician.  

## 2023-01-26 NOTE — Progress Notes (Signed)
Pt's states no medical or surgical changes since previsit or office visit. 

## 2023-01-29 ENCOUNTER — Encounter: Payer: Self-pay | Admitting: Internal Medicine

## 2023-01-29 ENCOUNTER — Telehealth: Payer: Self-pay | Admitting: *Deleted

## 2023-01-29 NOTE — Telephone Encounter (Signed)
Left message on f/u call 

## 2023-01-30 ENCOUNTER — Other Ambulatory Visit (HOSPITAL_COMMUNITY): Payer: Self-pay

## 2023-02-02 ENCOUNTER — Encounter: Payer: Self-pay | Admitting: Gastroenterology

## 2023-02-15 ENCOUNTER — Other Ambulatory Visit (HOSPITAL_COMMUNITY): Payer: Self-pay

## 2023-02-15 MED ORDER — AMPHETAMINE-DEXTROAMPHETAMINE 20 MG PO TABS
20.0000 mg | ORAL_TABLET | Freq: Two times a day (BID) | ORAL | 0 refills | Status: DC
  Filled 2023-03-15: qty 180, 90d supply, fill #0

## 2023-02-15 MED ORDER — ARIPIPRAZOLE 2 MG PO TABS
2.0000 mg | ORAL_TABLET | Freq: Every day | ORAL | 0 refills | Status: DC
  Filled 2023-02-15 – 2023-02-17 (×2): qty 30, 30d supply, fill #0

## 2023-02-17 ENCOUNTER — Other Ambulatory Visit (HOSPITAL_COMMUNITY): Payer: Self-pay

## 2023-02-17 ENCOUNTER — Other Ambulatory Visit: Payer: Self-pay | Admitting: Family

## 2023-02-20 ENCOUNTER — Other Ambulatory Visit: Payer: Self-pay

## 2023-02-20 ENCOUNTER — Other Ambulatory Visit (HOSPITAL_COMMUNITY): Payer: Self-pay

## 2023-02-23 ENCOUNTER — Other Ambulatory Visit (HOSPITAL_COMMUNITY): Payer: Self-pay

## 2023-02-24 ENCOUNTER — Other Ambulatory Visit (HOSPITAL_COMMUNITY): Payer: Self-pay

## 2023-02-26 ENCOUNTER — Other Ambulatory Visit (HOSPITAL_COMMUNITY): Payer: Self-pay

## 2023-02-26 ENCOUNTER — Other Ambulatory Visit: Payer: Self-pay

## 2023-02-26 MED ORDER — HYDROCHLOROTHIAZIDE 12.5 MG PO TABS
12.5000 mg | ORAL_TABLET | Freq: Every day | ORAL | 0 refills | Status: DC
  Filled 2023-02-26: qty 90, 90d supply, fill #0

## 2023-02-26 MED ORDER — CLONIDINE HCL 0.1 MG PO TABS
0.2000 mg | ORAL_TABLET | Freq: Every evening | ORAL | 1 refills | Status: DC
  Filled 2023-02-26 (×2): qty 270, 90d supply, fill #0
  Filled 2023-05-29: qty 270, 90d supply, fill #1

## 2023-02-26 MED ORDER — TRINTELLIX 20 MG PO TABS
20.0000 mg | ORAL_TABLET | Freq: Every day | ORAL | 1 refills | Status: DC
  Filled 2023-02-26: qty 30, 30d supply, fill #0
  Filled 2023-03-23 – 2023-04-28 (×2): qty 30, 30d supply, fill #1
  Filled 2023-06-07: qty 30, 30d supply, fill #2
  Filled 2023-07-04: qty 30, 30d supply, fill #3
  Filled 2023-08-02: qty 30, 30d supply, fill #4
  Filled 2023-09-02: qty 30, 30d supply, fill #5

## 2023-02-26 NOTE — Telephone Encounter (Signed)
Pt has been out of this Rx for 3 days   Pt states Pharmacy is telling her MD denied the refill.   Pt is asking why?  LOV:  07/19/22

## 2023-02-27 ENCOUNTER — Other Ambulatory Visit: Payer: Self-pay

## 2023-03-01 ENCOUNTER — Other Ambulatory Visit (HOSPITAL_COMMUNITY): Payer: Self-pay

## 2023-03-02 ENCOUNTER — Other Ambulatory Visit: Payer: Self-pay

## 2023-03-02 ENCOUNTER — Other Ambulatory Visit (HOSPITAL_COMMUNITY): Payer: Self-pay

## 2023-03-02 MED ORDER — BELSOMRA 20 MG PO TABS
1.0000 | ORAL_TABLET | Freq: Every day | ORAL | 0 refills | Status: DC
  Filled 2023-03-02: qty 90, 90d supply, fill #0

## 2023-03-05 ENCOUNTER — Other Ambulatory Visit: Payer: Self-pay

## 2023-03-05 ENCOUNTER — Other Ambulatory Visit (HOSPITAL_COMMUNITY): Payer: Self-pay

## 2023-03-15 ENCOUNTER — Other Ambulatory Visit (HOSPITAL_COMMUNITY): Payer: Self-pay

## 2023-03-15 ENCOUNTER — Other Ambulatory Visit: Payer: Self-pay

## 2023-03-15 MED ORDER — ARIPIPRAZOLE 2 MG PO TABS
2.0000 mg | ORAL_TABLET | Freq: Every evening | ORAL | 4 refills | Status: DC
  Filled 2023-03-15: qty 30, 30d supply, fill #0

## 2023-03-23 ENCOUNTER — Other Ambulatory Visit: Payer: Self-pay

## 2023-03-30 ENCOUNTER — Other Ambulatory Visit: Payer: Self-pay

## 2023-03-30 ENCOUNTER — Other Ambulatory Visit (HOSPITAL_COMMUNITY): Payer: Self-pay

## 2023-03-31 ENCOUNTER — Other Ambulatory Visit (HOSPITAL_COMMUNITY): Payer: Self-pay

## 2023-04-02 ENCOUNTER — Other Ambulatory Visit: Payer: Self-pay

## 2023-04-02 DIAGNOSIS — L814 Other melanin hyperpigmentation: Secondary | ICD-10-CM | POA: Diagnosis not present

## 2023-04-02 DIAGNOSIS — D229 Melanocytic nevi, unspecified: Secondary | ICD-10-CM | POA: Diagnosis not present

## 2023-04-02 DIAGNOSIS — L578 Other skin changes due to chronic exposure to nonionizing radiation: Secondary | ICD-10-CM | POA: Diagnosis not present

## 2023-04-02 DIAGNOSIS — L821 Other seborrheic keratosis: Secondary | ICD-10-CM | POA: Diagnosis not present

## 2023-04-02 DIAGNOSIS — D1801 Hemangioma of skin and subcutaneous tissue: Secondary | ICD-10-CM | POA: Diagnosis not present

## 2023-04-02 DIAGNOSIS — L281 Prurigo nodularis: Secondary | ICD-10-CM | POA: Diagnosis not present

## 2023-04-03 ENCOUNTER — Other Ambulatory Visit: Payer: Self-pay | Admitting: Internal Medicine

## 2023-04-03 ENCOUNTER — Other Ambulatory Visit (HOSPITAL_COMMUNITY): Payer: Self-pay

## 2023-04-03 ENCOUNTER — Other Ambulatory Visit: Payer: Self-pay

## 2023-04-03 MED ORDER — LOSARTAN POTASSIUM 100 MG PO TABS
100.0000 mg | ORAL_TABLET | Freq: Every day | ORAL | 1 refills | Status: DC
  Filled 2023-04-03: qty 90, 90d supply, fill #0
  Filled 2023-07-04: qty 90, 90d supply, fill #1

## 2023-04-06 ENCOUNTER — Other Ambulatory Visit (HOSPITAL_COMMUNITY): Payer: Self-pay

## 2023-04-06 MED ORDER — ARIPIPRAZOLE 5 MG PO TABS
5.0000 mg | ORAL_TABLET | Freq: Every day | ORAL | 1 refills | Status: DC
  Filled 2023-04-06: qty 90, 90d supply, fill #0
  Filled 2023-04-08 – 2023-07-02 (×2): qty 90, 90d supply, fill #1

## 2023-04-09 ENCOUNTER — Other Ambulatory Visit: Payer: Self-pay

## 2023-04-09 ENCOUNTER — Other Ambulatory Visit (HOSPITAL_COMMUNITY): Payer: Self-pay

## 2023-04-09 MED ORDER — GABAPENTIN 400 MG PO CAPS
2400.0000 mg | ORAL_CAPSULE | Freq: Every day | ORAL | 1 refills | Status: DC
  Filled 2023-04-13: qty 540, 90d supply, fill #0
  Filled 2023-07-07: qty 540, 90d supply, fill #1
  Filled 2023-11-10: qty 360, 60d supply, fill #2

## 2023-04-13 ENCOUNTER — Other Ambulatory Visit: Payer: Self-pay

## 2023-04-14 ENCOUNTER — Other Ambulatory Visit (HOSPITAL_COMMUNITY): Payer: Self-pay

## 2023-04-17 ENCOUNTER — Other Ambulatory Visit: Payer: Self-pay | Admitting: Internal Medicine

## 2023-04-17 ENCOUNTER — Other Ambulatory Visit (HOSPITAL_COMMUNITY): Payer: Self-pay

## 2023-04-17 MED ORDER — HYDROXYZINE PAMOATE 25 MG PO CAPS
25.0000 mg | ORAL_CAPSULE | Freq: Three times a day (TID) | ORAL | 1 refills | Status: DC | PRN
  Filled 2023-04-17: qty 30, 10d supply, fill #0
  Filled 2023-05-19: qty 30, 10d supply, fill #1

## 2023-04-18 ENCOUNTER — Other Ambulatory Visit (HOSPITAL_COMMUNITY): Payer: Self-pay

## 2023-04-18 ENCOUNTER — Other Ambulatory Visit: Payer: Self-pay

## 2023-04-18 MED ORDER — CLOBETASOL PROPIONATE 0.05 % EX CREA
1.0000 | TOPICAL_CREAM | Freq: Two times a day (BID) | CUTANEOUS | 1 refills | Status: AC
  Filled 2023-04-18: qty 60, 30d supply, fill #0

## 2023-04-18 MED ORDER — CYCLOSPORINE 0.05 % OP EMUL
1.0000 [drp] | Freq: Two times a day (BID) | OPHTHALMIC | 3 refills | Status: DC
  Filled 2023-04-18: qty 180, 90d supply, fill #0

## 2023-04-28 ENCOUNTER — Other Ambulatory Visit (HOSPITAL_COMMUNITY): Payer: Self-pay

## 2023-04-30 ENCOUNTER — Other Ambulatory Visit (HOSPITAL_COMMUNITY): Payer: Self-pay

## 2023-04-30 ENCOUNTER — Other Ambulatory Visit: Payer: Self-pay

## 2023-05-10 ENCOUNTER — Encounter (INDEPENDENT_AMBULATORY_CARE_PROVIDER_SITE_OTHER): Payer: Self-pay

## 2023-05-14 ENCOUNTER — Other Ambulatory Visit: Payer: Self-pay | Admitting: Oncology

## 2023-05-14 DIAGNOSIS — Z006 Encounter for examination for normal comparison and control in clinical research program: Secondary | ICD-10-CM

## 2023-05-19 ENCOUNTER — Other Ambulatory Visit (HOSPITAL_COMMUNITY): Payer: Self-pay

## 2023-05-21 ENCOUNTER — Other Ambulatory Visit: Payer: Self-pay | Admitting: Family

## 2023-05-21 DIAGNOSIS — L281 Prurigo nodularis: Secondary | ICD-10-CM | POA: Diagnosis not present

## 2023-05-26 ENCOUNTER — Other Ambulatory Visit: Payer: Self-pay | Admitting: Acute Care

## 2023-05-26 DIAGNOSIS — Z122 Encounter for screening for malignant neoplasm of respiratory organs: Secondary | ICD-10-CM

## 2023-05-26 DIAGNOSIS — Z87891 Personal history of nicotine dependence: Secondary | ICD-10-CM

## 2023-05-26 DIAGNOSIS — F17210 Nicotine dependence, cigarettes, uncomplicated: Secondary | ICD-10-CM

## 2023-05-29 ENCOUNTER — Telehealth: Payer: Self-pay | Admitting: Internal Medicine

## 2023-05-29 ENCOUNTER — Other Ambulatory Visit: Payer: Self-pay

## 2023-05-30 ENCOUNTER — Ambulatory Visit (INDEPENDENT_AMBULATORY_CARE_PROVIDER_SITE_OTHER): Payer: PPO | Admitting: *Deleted

## 2023-05-30 ENCOUNTER — Telehealth: Payer: Self-pay | Admitting: *Deleted

## 2023-05-30 DIAGNOSIS — Z23 Encounter for immunization: Secondary | ICD-10-CM | POA: Diagnosis not present

## 2023-05-30 NOTE — Telephone Encounter (Signed)
error 

## 2023-05-30 NOTE — Telephone Encounter (Signed)
Noted  

## 2023-05-30 NOTE — Telephone Encounter (Signed)
Yes  her last prevnar 13 was over 5 years ago before age 67  .prenar 20 is  indicated

## 2023-05-30 NOTE — Telephone Encounter (Signed)
Message sent to PCP for approval as the patient was scheduled on the nurse's visit today for a Prevnar 20 vaccine.

## 2023-05-30 NOTE — Telephone Encounter (Signed)
Spoke to provider this morning on prevnar 20.   Pt provider okay for prevnar 20 after looking at pt chart.

## 2023-06-02 ENCOUNTER — Other Ambulatory Visit (HOSPITAL_COMMUNITY): Payer: Self-pay

## 2023-06-04 ENCOUNTER — Ambulatory Visit (INDEPENDENT_AMBULATORY_CARE_PROVIDER_SITE_OTHER): Payer: PPO

## 2023-06-04 VITALS — Ht 66.0 in | Wt 120.0 lb

## 2023-06-04 DIAGNOSIS — Z0000 Encounter for general adult medical examination without abnormal findings: Secondary | ICD-10-CM | POA: Diagnosis not present

## 2023-06-04 NOTE — Patient Instructions (Addendum)
Jasmine Nelson , Thank you for taking time to come for your Medicare Wellness Visit. I appreciate your ongoing commitment to your health goals. Please review the following plan we discussed and let me know if I can assist you in the future.   Referrals/Orders/Follow-Ups/Clinician Recommendations:   This is a list of the screening recommended for you and due dates:  Health Maintenance  Topic Date Due   Complete foot exam   Never done   Eye exam for diabetics  Never done   Yearly kidney health urinalysis for diabetes  Never done   Mammogram  10/08/2022   Hemoglobin A1C  01/18/2023   Flu Shot  04/26/2023   COVID-19 Vaccine (5 - 2023-24 season) 05/27/2023   Yearly kidney function blood test for diabetes  07/20/2023   Medicare Annual Wellness Visit  06/03/2024   Colon Cancer Screening  01/26/2028   DTaP/Tdap/Td vaccine (2 - Tdap) 11/08/2028   Pneumonia Vaccine  Completed   DEXA scan (bone density measurement)  Completed   Hepatitis C Screening  Completed   Zoster (Shingles) Vaccine  Completed   HPV Vaccine  Aged Out    Advanced directives: (Copy Requested) Please bring a copy of your health care power of attorney and living will to the office to be added to your chart at your convenience.  Next Medicare Annual Wellness Visit scheduled for next year: Yes

## 2023-06-04 NOTE — Progress Notes (Signed)
Subjective:   Jasmine Nelson is a 67 y.o. female who presents for Medicare Annual (Subsequent) preventive examination.  Visit Complete: Virtual  I connected with  Trudee Grip on 06/04/23 by a audio enabled telemedicine application and verified that I am speaking with the correct person using two identifiers.  Patient Location: Home  Provider Location: Home Office  I discussed the limitations of evaluation and management by telemedicine. The patient expressed understanding and agreed to proceed.   Review of Systems    Vital Signs: Unable to obtain new vitals due to this being a telehealth visit.  Cardiac Risk Factors include: advanced age (>19men, >74 women);smoking/ tobacco exposure     Objective:    Today's Vitals   06/04/23 1508  Weight: 120 lb (54.4 kg)  Height: 5\' 6"  (1.676 m)   Body mass index is 19.37 kg/m.     06/04/2023    3:16 PM 05/22/2022    1:36 PM 05/18/2021    3:47 PM 01/03/2018    7:27 AM  Advanced Directives  Does Patient Have a Medical Advance Directive? Yes Yes No No  Type of Estate agent of [place];Living will Healthcare Power of [place];Living will    Copy of Healthcare Power of Attorney in Chart? No - copy requested No - copy requested    Would patient like information on creating a medical advance directive?   No - Patient declined No - Patient declined    Current Medications (verified) Outpatient Encounter Medications as of 06/04/2023  Medication Sig   amphetamine-dextroamphetamine (ADDERALL) 20 MG tablet Take 1 tablet (20 mg total) by mouth 2 (two) times daily in the morning and at 12 noon.   ARIPiprazole (ABILIFY) 5 MG tablet Take 1 tablet (5 mg total) by mouth at bedtime.   Ca Phosphate-Cholecalciferol (CALCIUM WITH D3 PO) Take by mouth.   calcium citrate-vitamin D (CITRACAL+D) 315-200 MG-UNIT tablet Take 1 tablet by mouth 2 (two) times daily.   clobetasol cream (TEMOVATE) 0.05 % Apply 1 Application topically 2 (two) times  daily.   cloNIDine (CATAPRES) 0.1 MG tablet Take 2 - 3 tablets by mouth at bedtime.   cycloSPORINE (RESTASIS) 0.05 % ophthalmic emulsion Place 1 drop into both eyes twice a day   gabapentin (NEURONTIN) 400 MG capsule Take 6 capsules (2,400 mg total) by mouth at bedtime.   hydrochlorothiazide (HYDRODIURIL) 12.5 MG tablet Take 1 tablet (12.5 mg total) by mouth daily. Please schedule appt for further refills   hydrOXYzine (VISTARIL) 25 MG capsule Take 1 capsule (25 mg total) by mouth every 8 (eight) hours as needed for itching   losartan (COZAAR) 100 MG tablet Take 1 tablet (100 mg total) by mouth daily.   Probiotic Product (ALIGN PO) Take by mouth.   Suvorexant (BELSOMRA) 20 MG TABS Take 1 tablet by mouth at bedtime   vortioxetine HBr (TRINTELLIX) 20 MG TABS tablet Take 1 tablet (20 mg total) by mouth daily.   Zinc Sulfate (ZINC 15 PO) Take 1 tablet by mouth daily.   [DISCONTINUED] amphetamine-dextroamphetamine (ADDERALL) 20 MG tablet Take one tablet by mouth in the morning and one tablet at noon   [DISCONTINUED] ARIPiprazole (ABILIFY) 2 MG tablet Take 1 tablet (2 mg total) by mouth at bedtime.   [DISCONTINUED] CHANTIX STARTING MONTH PAK 0.5 MG X 11 & 1 MG X 42 tablet  (Patient not taking: Reported on 05/22/2022)   [DISCONTINUED] vortioxetine HBr (TRINTELLIX) 20 MG TABS tablet Take 1 tablet (20 mg total) by mouth daily.  Facility-Administered Encounter Medications as of 06/04/2023  Medication   0.9 %  sodium chloride infusion    Allergies (verified) Lisinopril   History: Past Medical History:  Diagnosis Date   Allergy    Anxiety    Cataract    both eyes   Chicken pox    COPD (chronic obstructive pulmonary disease) (HCC)    Depression    Glaucoma    Hx of thyroid disease    3rd grade  resolved    Hypertension    Ingestion of substance    3 04   Osteoporosis    Shift work sleep disorder    Past Surgical History:  Procedure Laterality Date   ADENOIDECTOMY  1959   BREAST BIOPSY  Right 2019   COLONOSCOPY     Family History  Problem Relation Age of Onset   Cancer Mother 79       Died at 24 of Non-Hodgkins Lymphoma   Lymphoma Mother        77   Colon polyps Mother        "many"; required regular colonoscopies   Alcohol abuse Father    COPD Father    Depression Father    Hyperlipidemia Father    Hypertension Father        deceased   Colon polyps Father        "many"; required regular colonoscopies   Colon cancer Sister        dx. 13-56   Other Sister        +CHEK2 mutation   COPD Maternal Aunt    Ovarian cancer Maternal Aunt 62   Stroke Maternal Grandmother    Heart Problems Maternal Grandmother    Cancer Maternal Grandfather 29       Brain Cancer   Arthritis Paternal Grandmother    Depression Paternal Grandmother    Glaucoma Paternal Grandmother    Breast cancer Paternal Grandmother        dx. 50s   Uterine cancer Paternal Grandmother        dx. 60s   Renal cancer Paternal Grandfather        smoker   Multiple myeloma Maternal Uncle        dx. well before his 14s   Ovarian cancer Other    Esophageal cancer Neg Hx    Stomach cancer Neg Hx    Rectal cancer Neg Hx    Social History   Socioeconomic History   Marital status: Married    Spouse name: Not on file   Number of children: Not on file   Years of education: Not on file   Highest education level: Not on file  Occupational History   Not on file  Tobacco Use   Smoking status: Every Day    Current packs/day: 0.50    Types: Cigarettes   Smokeless tobacco: Never  Vaping Use   Vaping status: Some Days  Substance and Sexual Activity   Alcohol use: Yes    Comment: occasional wine   Drug use: No   Sexual activity: Not on file  Other Topics Concern   Not on file  Social History Narrative   Averages 5-8 hours of sleep per day.   Works third shift   2 people living in the home   3 dogs and 1 cat in the home   Radiology tech bs degree  [employer]   Married    1ppd   Stopped  etoh   Seatbelts fa stored safely.  [id number]      Social Determinants of Health   Financial Resource Strain: Low Risk  (06/04/2023)   Overall Financial Resource Strain (CARDIA)    Difficulty of Paying Living Expenses: Not hard at all  Food Insecurity: No Food Insecurity (06/04/2023)   Hunger Vital Sign    Worried About Running Out of Food in the Last Year: Never true    Ran Out of Food in the Last Year: Never true  Transportation Needs: No Transportation Needs (06/04/2023)   PRAPARE - Administrator, Civil Service (Medical): No    Lack of Transportation (Non-Medical): No  Physical Activity: Inactive (06/04/2023)   Exercise Vital Sign    Days of Exercise per Week: 0 days    Minutes of Exercise per Session: 0 min  Stress: No Stress Concern Present (06/04/2023)   [employer] of Occupational Health - Occupational Stress Questionnaire    Feeling of Stress : Not at all  Social Connections: Moderately Isolated (06/04/2023)   Social Connection and Isolation Panel [NHANES]    Frequency of Communication with Friends and Family: More than three times a week    Frequency of Social Gatherings with Friends and Family: More than three times a week    Attends Religious Services: Never    Database administrator or Organizations: No    Attends Engineer, structural: Never    Marital Status: Married    Tobacco Counseling Ready to quit: Yes Counseling given: Yes   Clinical Intake:  Pre-visit preparation completed: Yes  Pain : No/denies pain     BMI - recorded: 19.37 Nutritional Status: BMI of 19-24  Normal Nutritional Risks: None Diabetes: No  How often do you need to have someone help you when you read instructions, pamphlets, or other written materials from your doctor or pharmacy?: 1 - Never  Interpreter Needed?: No  Information entered by :: Theresa Mulligan LPN   Activities of Daily Living    06/04/2023    3:15 PM  In your present state of health, do you have  any difficulty performing the following activities:  Hearing? 0  Vision? 0  Difficulty concentrating or making decisions? 0  Walking or climbing stairs? 0  Dressing or bathing? 0  Doing errands, shopping? 0  Preparing Food and eating ? N  Using the Toilet? N  In the past six months, have you accidently leaked urine? N  Do you have problems with loss of bowel control? N  Managing your Medications? N  Managing your Finances? N  Housekeeping or managing your Housekeeping? N    Patient Care Team: Panosh, Neta Mends, MD as PCP - Cherlynn Polo, MD as Consulting Physician (Ophthalmology) Milagros Evener, MD as Consulting Physician (Psychiatry)  Indicate any recent Medical Services you may have received from other than [facility] providers in the past year (date may be approximate).     Assessment:   This is a routine wellness examination for [facility].  Hearing/Vision screen Hearing Screening - Comments:: Denies hearing difficulties   Vision Screening - Comments:: Wears rx glasses - up to date with routine eye exams with  Dr Dione Booze   Goals Addressed               This Visit's Progress     Complete personal projects (pt-stated)         Depression Screen    06/04/2023    3:15 PM 05/22/2022    1:37 PM 05/18/2021    3:41 PM 11/08/2018  8:41 AM  PHQ 2/9 Scores  PHQ - 2 Score 0 0 0 0    Fall Risk    06/04/2023    3:15 PM 05/22/2022    1:37 PM 05/18/2022   11:04 AM 05/18/2021    3:48 PM  Fall Risk   Falls in the past year? 0 0 0 0  Number falls in past yr: 0 0 0 0  Injury with Fall? 0 0 0 0  Risk for fall due to : No Fall Risks Medication side effect  No Fall Risks  Follow up Falls prevention discussed Falls evaluation completed;Education provided;Falls prevention discussed  Falls evaluation completed    MEDICARE RISK AT HOME: Medicare Risk at Home Any stairs in or around the home?: Yes If so, are there any without handrails?: No Home free of loose throw rugs in walkways,  pet beds, electrical cords, etc?: Yes Adequate lighting in your home to reduce risk of falls?: Yes Life alert?: No Use of a cane, walker or w/c?: No Grab bars in the bathroom?: No Shower chair or bench in shower?: No Elevated toilet seat or a handicapped toilet?: Yes  TIMED UP AND GO:  Was the test performed?  No    Cognitive Function:        06/04/2023    3:17 PM 05/22/2022    1:38 PM  6CIT Screen  What Year? 0 points 0 points  What month? 0 points 0 points  What time? 0 points 0 points  Count back from 20 0 points 0 points  Months in reverse 0 points 0 points  Repeat phrase 0 points 0 points  Total Score 0 points 0 points    Immunizations Immunization History  Administered Date(s) Administered   Influenza Whole 07/09/2009   Influenza, High Dose Seasonal PF 06/26/2022   Influenza, Quadrivalent, Recombinant, Inj, Pf 07/12/2021   Moderna SARS-COV2 Booster Vaccination 08/09/2020   Moderna Sars-Covid-2 Vaccination 12/18/2019, 01/14/2020   PFIZER(Purple Top)SARS-COV-2 Vaccination 03/25/2021, 07/06/2022   PNEUMOCOCCAL CONJUGATE-20 05/30/2023   Pneumococcal Conjugate-13 10/31/2013   Pneumococcal Polysaccharide-23 01/22/2015, 05/18/2021   Rsv, Bivalent, Protein Subunit Rsvpref,pf Verdis Frederickson) 08/05/2022   Td 11/08/2018   Zoster Recombinant(Shingrix) 11/08/2018, 06/20/2019    TDAP status: Up to date  Flu Vaccine status: Due, Education has been provided regarding the importance of this vaccine. Advised may receive this vaccine at local pharmacy or Health Dept. Aware to provide a copy of the vaccination record if obtained from local pharmacy or Health Dept. Verbalized acceptance and understanding.  Pneumococcal vaccine status: Up to date  Covid-19 vaccine status: Declined, Education has been provided regarding the importance of this vaccine but patient still declined. Advised may receive this vaccine at local pharmacy or Health Dept.or vaccine clinic. Aware to provide a copy of  the vaccination record if obtained from local pharmacy or Health Dept. Verbalized acceptance and understanding.  Qualifies for Shingles Vaccine? Yes   Zostavax completed Yes   Shingrix Completed?: Yes  Screening Tests Health Maintenance  Topic Date Due   FOOT EXAM  Never done   OPHTHALMOLOGY EXAM  Never done   Diabetic kidney evaluation - Urine ACR  Never done   MAMMOGRAM  10/08/2022   HEMOGLOBIN A1C  01/18/2023   INFLUENZA VACCINE  04/26/2023   COVID-19 Vaccine (5 - 2023-24 season) 05/27/2023   Diabetic kidney evaluation - eGFR measurement  07/20/2023   Medicare Annual Wellness (AWV)  06/03/2024   Colonoscopy  01/26/2028   DTaP/Tdap/Td (2 - Tdap) 11/08/2028   Pneumonia Vaccine  6+ Years old  Completed   DEXA SCAN  Completed   Hepatitis C Screening  Completed   Zoster Vaccines- Shingrix  Completed   HPV VACCINES  Aged Out    Health Maintenance  Health Maintenance Due  Topic Date Due   FOOT EXAM  Never done   OPHTHALMOLOGY EXAM  Never done   Diabetic kidney evaluation - Urine ACR  Never done   MAMMOGRAM  10/08/2022   HEMOGLOBIN A1C  01/18/2023   INFLUENZA VACCINE  04/26/2023   COVID-19 Vaccine (5 - 2023-24 season) 05/27/2023    Colorectal cancer screening: Type of screening: Colonoscopy. Completed 01/26/23. Repeat every 5 years  Mammogram status: Ordered Patient deferred. Pt provided with contact info and advised to call to schedule appt.   Bone Density status: Completed 12/27/17. Results reflect: Bone density results: OSTEOPENIA. Repeat every   years.  Lung Cancer Screening: (Low Dose CT Chest recommended if Age 30-80 years, 20 pack-year currently smoking OR have quit w/in 15years.) does qualify.   Lung Cancer Screening Referral: Deferred  Additional Screening:  Hepatitis C Screening: does qualify; Completed 07/02/15  Vision Screening: Recommended annual ophthalmology exams for early detection of glaucoma and other disorders of the eye. Is the patient up to date with  their annual eye exam?  Yes  Who is the provider or what is the name of the office in which the patient attends annual eye exams? Dr Dione Booze If pt is not established with a provider, would they like to be referred to a provider to establish care? No .   Dental Screening: Recommended annual dental exams for proper oral hygiene    Community Resource Referral / Chronic Care Management:  CRR required this visit?  No   CCM required this visit?  No     Plan:     I have personally reviewed and noted the following in the patient's chart:   Medical and social history Use of alcohol, tobacco or illicit drugs  Current medications and supplements including opioid prescriptions. Patient is not currently taking opioid prescriptions. Functional ability and status Nutritional status Physical activity Advanced directives List of other physicians Hospitalizations, surgeries, and ER visits in previous 12 months Vitals Screenings to include cognitive, depression, and falls Referrals and appointments  In addition, I have reviewed and discussed with patient certain preventive protocols, quality metrics, and best practice recommendations. A written personalized care plan for preventive services as well as general preventive health recommendations were provided to patient.     Tillie Rung, LPN   09/30/1094   After Visit Summary: (MyChart) Due to this being a telephonic visit, the after visit summary with patients personalized plan was offered to patient via MyChart   Nurse Notes: None

## 2023-06-05 ENCOUNTER — Other Ambulatory Visit: Payer: Self-pay

## 2023-06-05 ENCOUNTER — Other Ambulatory Visit (HOSPITAL_COMMUNITY): Payer: Self-pay

## 2023-06-05 MED ORDER — BELSOMRA 20 MG PO TABS
1.0000 | ORAL_TABLET | Freq: Every day | ORAL | 0 refills | Status: DC
  Filled 2023-06-05: qty 90, 90d supply, fill #0

## 2023-06-06 ENCOUNTER — Other Ambulatory Visit (HOSPITAL_COMMUNITY): Payer: Self-pay

## 2023-06-08 ENCOUNTER — Other Ambulatory Visit: Payer: Self-pay

## 2023-06-08 ENCOUNTER — Other Ambulatory Visit (HOSPITAL_COMMUNITY): Payer: Self-pay

## 2023-07-02 ENCOUNTER — Other Ambulatory Visit: Payer: Self-pay

## 2023-07-02 ENCOUNTER — Other Ambulatory Visit: Payer: Self-pay | Admitting: Internal Medicine

## 2023-07-02 ENCOUNTER — Other Ambulatory Visit (HOSPITAL_COMMUNITY): Payer: Self-pay

## 2023-07-02 MED ORDER — HYDROCHLOROTHIAZIDE 12.5 MG PO TABS
12.5000 mg | ORAL_TABLET | Freq: Every day | ORAL | 0 refills | Status: DC
  Filled 2023-07-02: qty 90, 90d supply, fill #0

## 2023-07-03 DIAGNOSIS — L281 Prurigo nodularis: Secondary | ICD-10-CM | POA: Diagnosis not present

## 2023-07-04 ENCOUNTER — Other Ambulatory Visit (HOSPITAL_COMMUNITY): Payer: Self-pay

## 2023-07-04 ENCOUNTER — Other Ambulatory Visit: Payer: Self-pay

## 2023-07-07 DIAGNOSIS — Z23 Encounter for immunization: Secondary | ICD-10-CM | POA: Diagnosis not present

## 2023-07-08 ENCOUNTER — Other Ambulatory Visit (HOSPITAL_COMMUNITY): Payer: Self-pay

## 2023-07-16 DIAGNOSIS — H35371 Puckering of macula, right eye: Secondary | ICD-10-CM | POA: Diagnosis not present

## 2023-07-16 DIAGNOSIS — Z961 Presence of intraocular lens: Secondary | ICD-10-CM | POA: Diagnosis not present

## 2023-07-16 DIAGNOSIS — H31091 Other chorioretinal scars, right eye: Secondary | ICD-10-CM | POA: Diagnosis not present

## 2023-08-02 ENCOUNTER — Other Ambulatory Visit (HOSPITAL_COMMUNITY): Payer: Self-pay

## 2023-08-02 ENCOUNTER — Other Ambulatory Visit: Payer: Self-pay

## 2023-08-02 MED ORDER — CEPHALEXIN 500 MG PO CAPS
1000.0000 mg | ORAL_CAPSULE | Freq: Once | ORAL | 0 refills | Status: DC
  Filled 2023-08-02: qty 2, 1d supply, fill #0

## 2023-08-03 ENCOUNTER — Other Ambulatory Visit (HOSPITAL_COMMUNITY): Payer: Self-pay

## 2023-08-03 MED ORDER — HYDROCODONE-ACETAMINOPHEN 10-325 MG PO TABS
1.0000 | ORAL_TABLET | Freq: Four times a day (QID) | ORAL | 0 refills | Status: DC | PRN
Start: 2023-08-03 — End: 2023-10-11
  Filled 2023-08-03 (×2): qty 15, 4d supply, fill #0

## 2023-08-03 MED ORDER — CEPHALEXIN 500 MG PO CAPS
500.0000 mg | ORAL_CAPSULE | Freq: Two times a day (BID) | ORAL | 0 refills | Status: DC
  Filled 2023-08-03 (×2): qty 10, 5d supply, fill #0

## 2023-08-20 ENCOUNTER — Other Ambulatory Visit (HOSPITAL_COMMUNITY): Payer: Self-pay

## 2023-08-20 ENCOUNTER — Ambulatory Visit: Payer: PPO | Admitting: Family Medicine

## 2023-08-20 ENCOUNTER — Encounter: Payer: Self-pay | Admitting: Family Medicine

## 2023-08-20 ENCOUNTER — Other Ambulatory Visit: Payer: Self-pay

## 2023-08-20 ENCOUNTER — Ambulatory Visit (INDEPENDENT_AMBULATORY_CARE_PROVIDER_SITE_OTHER): Payer: PPO | Admitting: Family Medicine

## 2023-08-20 VITALS — BP 116/66 | HR 78 | Temp 98.7°F | Wt 121.4 lb

## 2023-08-20 DIAGNOSIS — R300 Dysuria: Secondary | ICD-10-CM

## 2023-08-20 DIAGNOSIS — N3001 Acute cystitis with hematuria: Secondary | ICD-10-CM | POA: Diagnosis not present

## 2023-08-20 LAB — POC URINALSYSI DIPSTICK (AUTOMATED)
Bilirubin, UA: NEGATIVE
Glucose, UA: NEGATIVE
Ketones, UA: NEGATIVE
Nitrite, UA: POSITIVE
Protein, UA: POSITIVE — AB
Spec Grav, UA: 1.02 (ref 1.010–1.025)
Urobilinogen, UA: 0.2 U/dL
pH, UA: 6 (ref 5.0–8.0)

## 2023-08-20 MED ORDER — NITROFURANTOIN MONOHYD MACRO 100 MG PO CAPS
100.0000 mg | ORAL_CAPSULE | Freq: Two times a day (BID) | ORAL | 0 refills | Status: AC
  Filled 2023-08-20: qty 14, 7d supply, fill #0

## 2023-08-20 NOTE — Progress Notes (Signed)
Established Patient Office Visit   Subjective  Patient ID: Jasmine Nelson, female    DOB: 1956-01-27  Age: 67 y.o. MRN: [id number]  Chief Complaint  Patient presents with   UTI symptoms     For 1 week     Patient is a 67 year old female followed by Dr. Fabian Sharp and seen for acute concern.  Patient with nausea, blood on toilet tissue, back pain, urinary urgency, and and episode of nocturnal enuresis starting last week.  Patient denies fever, chills, emesis, constipation.  Had some loose stools last week.  Patient also notes taking Keflex on 11/8 time 7 days after dental procedure.  Patient typically drinks tea and maybe 8 ounces of water per day.    Patient Active Problem List   Diagnosis Date Noted   Genetic testing 04/19/2015   Scalp lesion 11/09/2013   Glaucoma 10/31/2013   Abnormal Pap smear of cervix 10/31/2013   Tobacco dependence 10/31/2013   Infection of nose 10/31/2013   DERMATITIS, HANDS 07/23/2009   TOBACCO USE 03/09/2008   OTHER CIRCADIAN RHYTHM SLEEP DISORDER 03/09/2008   OSTEOPENIA 03/09/2008   GENERALIZED ANXIETY DISORDER 12/17/2007   DEPRESSION 12/17/2007   INSOMNIA 12/17/2007   Past Medical History:  Diagnosis Date   Allergy    Anxiety    Cataract    both eyes   Chicken pox    COPD (chronic obstructive pulmonary disease) (HCC)    Depression    Glaucoma    Hx of thyroid disease    3rd grade  resolved    Hypertension    Ingestion of substance    3 04   Osteoporosis    Shift work sleep disorder    Past Surgical History:  Procedure Laterality Date   ADENOIDECTOMY  1959   BREAST BIOPSY Right 2019   COLONOSCOPY     Social History   Tobacco Use   Smoking status: Every Day    Current packs/day: 0.50    Types: Cigarettes   Smokeless tobacco: Never  Vaping Use   Vaping status: Some Days  Substance Use Topics   Alcohol use: Yes    Comment: occasional wine   Drug use: No   Family History  Problem Relation Age of Onset   Cancer Mother 39        Died at 43 of Non-Hodgkins Lymphoma   Lymphoma Mother        32   Colon polyps Mother        "many"; required regular colonoscopies   Alcohol abuse Father    COPD Father    Depression Father    Hyperlipidemia Father    Hypertension Father        deceased   Colon polyps Father        "many"; required regular colonoscopies   Colon cancer Sister        dx. 62-56   Other Sister        +CHEK2 mutation   COPD Maternal Aunt    Ovarian cancer Maternal Aunt 62   Stroke Maternal Grandmother    Heart Problems Maternal Grandmother    Cancer Maternal Grandfather 22       Brain Cancer   Arthritis Paternal Grandmother    Depression Paternal Grandmother    Glaucoma Paternal Grandmother    Breast cancer Paternal Grandmother        dx. 50s   Uterine cancer Paternal Grandmother        dx. 60s   Renal cancer Paternal Grandfather  smoker   Multiple myeloma Maternal Uncle        dx. well before his 5s   Ovarian cancer Other    Esophageal cancer Neg Hx    Stomach cancer Neg Hx    Rectal cancer Neg Hx    Allergies  Allergen Reactions   Lisinopril Cough    Mild cough after a month       ROS Negative unless stated above    Objective:     BP 116/66   Pulse 78   Temp 98.7 F (37.1 C) (Oral)   Wt 121 lb 6.4 oz (55.1 kg)   SpO2 100%   BMI 19.59 kg/m  BP Readings from Last 3 Encounters:  08/20/23 116/66  01/26/23 127/76  07/19/22 116/74   Wt Readings from Last 3 Encounters:  08/20/23 121 lb 6.4 oz (55.1 kg)  06/04/23 120 lb (54.4 kg)  01/26/23 117 lb (53.1 kg)      Physical Exam Constitutional:      General: She is not in acute distress.    Appearance: Normal appearance.  HENT:     Head: Normocephalic and atraumatic.     Nose: Nose normal.     Mouth/Throat:     Mouth: Mucous membranes are moist.  Cardiovascular:     Rate and Rhythm: Normal rate and regular rhythm.     Heart sounds: Normal heart sounds. No murmur heard.    No gallop.  Pulmonary:      Effort: Pulmonary effort is normal. No respiratory distress.     Breath sounds: Normal breath sounds. No wheezing, rhonchi or rales.  Abdominal:     General: Bowel sounds are normal.     Palpations: Abdomen is soft.     Tenderness: There is abdominal tenderness in the right lower quadrant, suprapubic area and left lower quadrant. There is no right CVA tenderness, left CVA tenderness or guarding.  Skin:    General: Skin is warm and dry.  Neurological:     Mental Status: She is alert and oriented to person, place, and time.      Results for orders placed or performed in visit on 08/20/23  POCT Urinalysis Dipstick (Automated)  Result Value Ref Range   Color, UA Yellow    Clarity, UA Cloudy    Glucose, UA Negative Negative   Bilirubin, UA Negative    Ketones, UA Negative    Spec Grav, UA 1.020 1.010 - 1.025   Blood, UA 3+    pH, UA 6.0 5.0 - 8.0   Protein, UA Positive (A) Negative   Urobilinogen, UA 0.2 0.2 or 1.0 E.U./dL   Nitrite, UA Positive    Leukocytes, UA Small (1+) (A) Negative      Assessment & Plan:  Acute cystitis with hematuria -     Urine Culture -     Nitrofurantoin Monohyd Macro; Take 1 capsule (100 mg total) by mouth 2 (two) times daily for 7 days.  Dispense: 14 capsule; Refill: 0  Dysuria -     POCT Urinalysis Dipstick (Automated)  Patient with acute urinary symptoms.  POC UA with 3+ RBCs, 1+ leuks, nitrites, protein.  Start ABX for UTI.  Obtain UCX.  Change antibiotic if needed based on culture results.  Patient advised to increase p.o. intake of water and fluids.  Chemistry follow-up with PCP as needed.  Return if symptoms worsen or fail to improve.   Deeann Saint, MD

## 2023-08-21 ENCOUNTER — Other Ambulatory Visit: Payer: Self-pay

## 2023-08-21 ENCOUNTER — Other Ambulatory Visit (HOSPITAL_COMMUNITY): Payer: Self-pay

## 2023-08-21 ENCOUNTER — Encounter: Payer: Self-pay | Admitting: Pharmacist

## 2023-08-21 MED ORDER — CLONIDINE HCL 0.1 MG PO TABS
0.2000 mg | ORAL_TABLET | Freq: Every day | ORAL | 0 refills | Status: DC
  Filled 2023-08-21: qty 270, 90d supply, fill #0

## 2023-08-22 LAB — URINE CULTURE
MICRO NUMBER:: 15775622
SPECIMEN QUALITY:: ADEQUATE

## 2023-08-27 ENCOUNTER — Encounter (HOSPITAL_BASED_OUTPATIENT_CLINIC_OR_DEPARTMENT_OTHER): Payer: Self-pay

## 2023-08-27 ENCOUNTER — Ambulatory Visit (HOSPITAL_BASED_OUTPATIENT_CLINIC_OR_DEPARTMENT_OTHER)
Admission: RE | Admit: 2023-08-27 | Discharge: 2023-08-27 | Disposition: A | Discharge status: Home / Self Care | Payer: PPO | Source: Ambulatory Visit | Attending: Acute Care | Admitting: Acute Care

## 2023-08-27 DIAGNOSIS — Z122 Encounter for screening for malignant neoplasm of respiratory organs: Secondary | ICD-10-CM | POA: Diagnosis not present

## 2023-08-27 DIAGNOSIS — Z87891 Personal history of nicotine dependence: Secondary | ICD-10-CM | POA: Diagnosis not present

## 2023-08-27 DIAGNOSIS — I700 Atherosclerosis of aorta: Secondary | ICD-10-CM | POA: Insufficient documentation

## 2023-08-27 DIAGNOSIS — F17210 Nicotine dependence, cigarettes, uncomplicated: Secondary | ICD-10-CM | POA: Insufficient documentation

## 2023-08-27 DIAGNOSIS — I2510 Atherosclerotic heart disease of native coronary artery without angina pectoris: Secondary | ICD-10-CM | POA: Insufficient documentation

## 2023-09-03 ENCOUNTER — Telehealth: Payer: Self-pay | Admitting: *Deleted

## 2023-09-03 ENCOUNTER — Other Ambulatory Visit: Payer: Self-pay

## 2023-09-03 ENCOUNTER — Other Ambulatory Visit (HOSPITAL_COMMUNITY): Payer: Self-pay

## 2023-09-03 NOTE — Telephone Encounter (Signed)
Please call when CT results are in.

## 2023-09-03 NOTE — Telephone Encounter (Signed)
Returned call. Spoke with patient. Advised results are taking 2-3 weeks. Patient verbalized understanding.

## 2023-09-04 NOTE — Telephone Encounter (Signed)
NFN 

## 2023-09-08 ENCOUNTER — Other Ambulatory Visit: Payer: Self-pay

## 2023-09-08 ENCOUNTER — Other Ambulatory Visit (HOSPITAL_COMMUNITY): Payer: Self-pay

## 2023-09-08 MED ORDER — AMPHETAMINE-DEXTROAMPHETAMINE 20 MG PO TABS
20.0000 mg | ORAL_TABLET | Freq: Two times a day (BID) | ORAL | 0 refills | Status: DC
  Filled 2023-09-08: qty 180, 90d supply, fill #0

## 2023-09-08 MED ORDER — TRINTELLIX 20 MG PO TABS
20.0000 mg | ORAL_TABLET | Freq: Every day | ORAL | 11 refills | Status: DC
  Filled 2023-10-19: qty 30, 30d supply, fill #0
  Filled 2023-11-16: qty 30, 30d supply, fill #1
  Filled 2023-12-17: qty 30, 30d supply, fill #2
  Filled 2024-01-15: qty 30, 30d supply, fill #3
  Filled 2024-02-11: qty 30, 30d supply, fill #4
  Filled 2024-03-12: qty 30, 30d supply, fill #5
  Filled 2024-04-09: qty 30, 30d supply, fill #6
  Filled 2024-05-06: qty 30, 30d supply, fill #7
  Filled 2024-06-05: qty 30, 30d supply, fill #8

## 2023-09-08 MED ORDER — BELSOMRA 20 MG PO TABS
20.0000 mg | ORAL_TABLET | Freq: Every day | ORAL | 0 refills | Status: DC
  Filled 2023-09-08: qty 90, 90d supply, fill #0

## 2023-09-10 ENCOUNTER — Other Ambulatory Visit: Payer: Self-pay

## 2023-09-10 ENCOUNTER — Other Ambulatory Visit (HOSPITAL_COMMUNITY): Payer: Self-pay

## 2023-09-12 ENCOUNTER — Encounter: Payer: Self-pay | Admitting: Internal Medicine

## 2023-09-12 NOTE — Telephone Encounter (Signed)
 Care team updated and letter sent for eye exam notes.

## 2023-09-14 ENCOUNTER — Other Ambulatory Visit: Payer: Self-pay

## 2023-09-14 DIAGNOSIS — Z122 Encounter for screening for malignant neoplasm of respiratory organs: Secondary | ICD-10-CM

## 2023-09-14 DIAGNOSIS — F17210 Nicotine dependence, cigarettes, uncomplicated: Secondary | ICD-10-CM

## 2023-09-14 DIAGNOSIS — Z87891 Personal history of nicotine dependence: Secondary | ICD-10-CM

## 2023-09-23 ENCOUNTER — Other Ambulatory Visit: Payer: Self-pay | Admitting: Internal Medicine

## 2023-09-24 ENCOUNTER — Telehealth: Payer: Self-pay | Admitting: Internal Medicine

## 2023-09-24 ENCOUNTER — Other Ambulatory Visit (HOSPITAL_COMMUNITY): Payer: Self-pay

## 2023-09-24 NOTE — Telephone Encounter (Signed)
Last visit with Dr. Fabian Sharp 06/2022. Attempted to reach pt. Left a voicemail to call us back.

## 2023-09-24 NOTE — Telephone Encounter (Unsigned)
Copied from CRM #[id number]. Topic: Clinical - Medical Advice >> Sep 24, 2023  3:30 PM Melissa C wrote: Reason for CRM: patient received a call from someone- couldn't understand the name thinks it was [place]? Not quite sure what the call was regarding, if she needs to refill a prescription or make an appointment. Would like a call back please.

## 2023-09-25 ENCOUNTER — Other Ambulatory Visit: Payer: Self-pay

## 2023-09-25 MED ORDER — HYDROCHLOROTHIAZIDE 12.5 MG PO TABS
12.5000 mg | ORAL_TABLET | Freq: Every day | ORAL | 0 refills | Status: DC
  Filled 2023-09-25: qty 30, 30d supply, fill #0

## 2023-09-25 NOTE — Telephone Encounter (Signed)
Attempted to reach pt. Left a detail message that Rx is sent in for short supply and to schedule an appt for further refills. To call us back on Thursday as we are close today after 12pm and tomorrow.

## 2023-09-27 ENCOUNTER — Telehealth: Payer: Self-pay

## 2023-09-27 NOTE — Telephone Encounter (Signed)
 Copied from CRM [id number]. Topic: Clinical - Medication Refill >> Sep 24, 2023  5:00 PM Taleah C wrote: Most Recent Primary Care Visit:  Provider: MERCER KIRSCH R  Department: [facility]  Visit Type: OFFICE VISIT  Date: 08/20/2023  Medication: hydrochlorothiazide   Has the patient contacted their pharmacy? No  Patient may be confused because she stated that she received a call from [place] office to give a call and request for a refill. I advised that typically we wouldn't call for that unless it was stating that she needs an appt for it to be filled.   Is this the correct pharmacy for this prescription? Yes If no, delete pharmacy and type the correct one.  This is the patient's preferred pharmacy:  [facility] [address] [place] [address] Phone: [phone] Fax: [phone]   Has the prescription been filled recently? No  Is the patient out of the medication? No  Has the patient been seen for an appointment in the last year OR does the patient have an upcoming appointment? Yes  Can we respond through MyChart? Yes  Agent: Please be advised that Rx refills may take up to 3 business days. We ask that you follow-up with your pharmacy.

## 2023-09-27 NOTE — Telephone Encounter (Signed)
 Pt has been sch for cpe on 10-11-2023

## 2023-09-28 ENCOUNTER — Other Ambulatory Visit: Payer: Self-pay | Admitting: Family

## 2023-10-03 ENCOUNTER — Other Ambulatory Visit: Payer: Self-pay | Admitting: Internal Medicine

## 2023-10-04 ENCOUNTER — Other Ambulatory Visit: Payer: Self-pay

## 2023-10-04 MED ORDER — LOSARTAN POTASSIUM 100 MG PO TABS
100.0000 mg | ORAL_TABLET | Freq: Every day | ORAL | 1 refills | Status: DC
  Filled 2023-10-04: qty 90, 90d supply, fill #0
  Filled 2023-12-28: qty 90, 90d supply, fill #1

## 2023-10-10 NOTE — Progress Notes (Signed)
Chief Complaint  Patient presents with   Annual Exam    Pt reports she was taking antibiotic for uti in November and still having on going cloudy urine and urine odor. Denied of any discomfort when urine.     HPI: Patient  Jasmine Nelson  68 y.o. comes in today for Preventive Health Care visit   Sleep day alertness   sees psych and doing ok . Tobacco about the same  1ppd  doing lung caner screening protocol   Some dec exercise tolerance but able to go up at least 1 flight stairs without stopping  Has hx of recent uti  no sx except odor and cloudiness .  HT  no se of meds noted  losartan and hctz   Health Maintenance  Topic Date Due   FOOT EXAM  Never done   COVID-19 Vaccine (6 - 2024-25 season) 10/27/2023 (Originally 09/16/2023)   OPHTHALMOLOGY EXAM  01/31/2024 (Originally 05/24/1966)   MAMMOGRAM  04/09/2024 (Originally 10/08/2022)   HEMOGLOBIN A1C  04/09/2024   Medicare Annual Wellness (AWV)  06/03/2024   Diabetic kidney evaluation - eGFR measurement  10/10/2024   Diabetic kidney evaluation - Urine ACR  10/10/2024   Colonoscopy  01/26/2028   DTaP/Tdap/Td (2 - Tdap) 11/08/2028   Pneumonia Vaccine 46+ Years old  Completed   INFLUENZA VACCINE  Completed   DEXA SCAN  Completed   Hepatitis C Screening  Completed   Zoster Vaccines- Shingrix  Completed   HPV VACCINES  Aged Out   Health Maintenance Review LIFESTYLE:  Exercise:   Tobacco/ETS:1ppd Alcohol:  Sugar beverages:n Sleep: controlled  Drug use: no HH of  2 2 cats  Work: retired     ROS:  GEN/ HEENT: No fever, significant weight changes sweats headaches vision problems hearing changes, CV/ PULM; No chest pain shortness of breath cough, syncope,edema  change in exercise tolerance. GI /GU: No adominal pain, vomiting, change in bowel habits. No blood in the stool. No significant GU symptoms. SKIN/HEME: ,no acute skin rashes suspicious lesions or bleeding. No lymphadenopathy, nodules, masses.  NEURO/ PSYCH:  No  neurologic signs such as weakness numbness. No depression anxiety. IMM/ Allergy: No unusual infections.  Allergy .   REST of 12 system review negative except as per HPI   Past Medical History:  Diagnosis Date   Allergy    Anxiety    Cataract    both eyes   Chicken pox    COPD (chronic obstructive pulmonary disease) (HCC)    Depression    Glaucoma    Hx of thyroid disease    3rd grade  resolved    Hypertension    Ingestion of substance    3 04   Osteoporosis    Shift work sleep disorder     Past Surgical History:  Procedure Laterality Date   ADENOIDECTOMY  1959   BREAST BIOPSY Right 2019   COLONOSCOPY      Family History  Problem Relation Age of Onset   Cancer Mother 72       Died at 34 of Non-Hodgkins Lymphoma   Lymphoma Mother        69   Colon polyps Mother        "many"; required regular colonoscopies   Alcohol abuse Father    COPD Father    Depression Father    Hyperlipidemia Father    Hypertension Father        deceased   Colon polyps Father        "  many"; required regular colonoscopies   Colon cancer Sister        dx. 37-56   Other Sister        +CHEK2 mutation   COPD Maternal Aunt    Ovarian cancer Maternal Aunt 62   Stroke Maternal Grandmother    Heart Problems Maternal Grandmother    Cancer Maternal Grandfather 79       Brain Cancer   Arthritis Paternal Grandmother    Depression Paternal Grandmother    Glaucoma Paternal Grandmother    Breast cancer Paternal Grandmother        dx. 50s   Uterine cancer Paternal Grandmother        dx. 60s   Renal cancer Paternal Grandfather        smoker   Multiple myeloma Maternal Uncle        dx. well before his 87s   Ovarian cancer Other    Esophageal cancer Neg Hx    Stomach cancer Neg Hx    Rectal cancer Neg Hx     Social History   Socioeconomic History   Marital status: Married    Spouse name: Not on file   Number of children: Not on file   Years of education: Not on file   Highest  education level: Not on file  Occupational History   Not on file  Tobacco Use   Smoking status: Every Day    Current packs/day: 0.50    Types: Cigarettes   Smokeless tobacco: Never  Vaping Use   Vaping status: Some Days  Substance and Sexual Activity   Alcohol use: Yes    Comment: occasional wine   Drug use: No   Sexual activity: Not on file  Other Topics Concern   Not on file  Social History Narrative   Averages 5-8 hours of sleep per day.   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   [id number]      Social Drivers of Corporate investment banker Strain: Low Risk  (06/04/2023)   Overall Financial Resource Strain (CARDIA)    Difficulty of Paying Living Expenses: Not hard at all  Food Insecurity: No Food Insecurity (06/04/2023)   Hunger Vital Sign    Worried About Running Out of Food in the Last Year: Never true    Ran Out of Food in the Last Year: Never true  Transportation Needs: No Transportation Needs (06/04/2023)   PRAPARE - Administrator, Civil Service (Medical): No    Lack of Transportation (Non-Medical): No  Physical Activity: Inactive (10/11/2023)   Exercise Vital Sign    Days of Exercise per Week: 0 days    Minutes of Exercise per Session: 0 min  Stress: No Stress Concern Present (10/11/2023)   [employer] of Occupational Health - Occupational Stress Questionnaire    Feeling of Stress : Not at all  Social Connections: Socially Isolated (10/11/2023)   Social Connection and Isolation Panel [NHANES]    Frequency of Communication with Friends and Family: Once a week    Frequency of Social Gatherings with Friends and Family: Never    Attends Religious Services: Never    Database administrator or Organizations: No    Attends Banker Meetings: Never  Marital Status: Married    Outpatient Medications  Prior to Visit  Medication Sig Dispense Refill   amphetamine-dextroamphetamine (ADDERALL) 20 MG tablet Take 1 tablet (20 mg total) by mouth 2 (two) times daily (morning and noon). 180 tablet 0   Ca Phosphate-Cholecalciferol (CALCIUM WITH D3 PO) Take by mouth.     calcium citrate-vitamin D (CITRACAL+D) 315-200 MG-UNIT tablet Take 1 tablet by mouth 2 (two) times daily.     clobetasol cream (TEMOVATE) 0.05 % Apply 1 Application topically 2 (two) times daily. 60 g 1   cloNIDine (CATAPRES) 0.1 MG tablet Take 2-3 tablets (0.2-0.3 mg total) by mouth at bedtime. 270 tablet 0   DUPIXENT 300 MG/2ML SOAJ every 14 (fourteen) days.     gabapentin (NEURONTIN) 400 MG capsule Take 6 capsules (2,400 mg total) by mouth at bedtime. 720 capsule 1   hydrochlorothiazide (HYDRODIURIL) 12.5 MG tablet Take 1 tablet (12.5 mg total) by mouth daily. Please schedule appt for further refills 30 tablet 0   losartan (COZAAR) 100 MG tablet Take 1 tablet (100 mg total) by mouth daily. 90 tablet 1   Suvorexant (BELSOMRA) 20 MG TABS Take 1 tablet (20 mg total) by mouth at bedtime. 90 tablet 0   Suvorexant (BELSOMRA) 20 MG TABS Take 1 tablet (20 mg total) by mouth at bedtime. 90 tablet 0   vortioxetine HBr (TRINTELLIX) 20 MG TABS tablet Take 1 tablet (20 mg total) by mouth daily. 30 tablet 11   Zinc Sulfate (ZINC 15 PO) Take 1 tablet by mouth as needed.     cephALEXin (KEFLEX) 500 MG capsule Take 1 capsule (500 mg total) by mouth 2 (two) times daily until gone (Patient not taking: Reported on 10/11/2023) 10 capsule 0   cycloSPORINE (RESTASIS) 0.05 % ophthalmic emulsion Place 1 drop into both eyes twice a day (Patient not taking: Reported on 10/11/2023) 180 each 3   hydrOXYzine (VISTARIL) 25 MG capsule Take 1 capsule (25 mg total) by mouth every 8 (eight) hours as needed for itching (Patient not taking: Reported on 10/11/2023) 30 capsule 1   Probiotic Product (ALIGN PO) Take by mouth. (Patient not taking: Reported on 10/11/2023)      amphetamine-dextroamphetamine (ADDERALL) 20 MG tablet Take 1 tablet (20 mg total) by mouth 2 (two) times daily in the morning and at 12 noon. 180 tablet 0   ARIPiprazole (ABILIFY) 5 MG tablet Take 1 tablet (5 mg total) by mouth at bedtime. 90 tablet 1   HYDROcodone-acetaminophen (NORCO) 10-325 MG tablet Take 1 tablet by mouth every 6 (six) hours as needed for pain 15 tablet 0   Facility-Administered Medications Prior to Visit  Medication Dose Route Frequency Provider Last Rate Last Admin   0.9 %  sodium chloride infusion  500 mL Intravenous Once Nandigam, Eleonore Chiquito, MD         EXAM:  BP 97/67 (BP Location: Right Arm, Patient Position: Sitting, Cuff Size: Normal)   Pulse 67   Temp 97.9 F (36.6 C) (Oral)   Ht 5' 4.2" (1.631 m)   Wt 127 lb 9.6 oz (57.9 kg)   SpO2 96%   BMI 21.77 kg/m   Body mass index is 21.77 kg/m. Wt Readings from Last 3 Encounters:  10/11/23 127 lb 9.6 oz (57.9 kg)  08/20/23 121 lb 6.4 oz (55.1 kg)  06/04/23 120 lb (54.4 kg)    Physical Exam: Vital signs reviewed WGN:FAOZ is a well-developed well-nourished alert cooperative    who appearsr stated age in no acute distress.  HEENT: normocephalic atraumatic , Eyes: PERRL EOM's full, conjunctiva clear, Nares: paten,t no deformity discharge or tenderness., Ears: no deformity EAC's clear TMs with normal landmarks. Mouth: clear OP, no lesions, edema.  Moist mucous membranes. Dentition in adequate repair. NECK: supple without masses, thyromegaly or bruits. CHEST/PULM:  inc  ap diam and some kyposis Clear to auscultation and percussion breath sounds equal no wheeze , rales or rhonchi.  Breast: normal by inspection . No dimpling, discharge, masses, tenderness or discharge . CV: PMI is nondisplaced, S1 S2 no gallops, murmurs, rubs. Peripheral pulses are present without delay.No JVD .  ABDOMEN: Bowel sounds normal nontender  No guard or rebound, no hepato splenomegal no CVA tenderness.  Extremtities:  No clubbing cyanosis  or edema, no acute joint swelling or redness no focal atrophy NEURO:  Oriented x3, cranial nerves 3-12 appear to be intact, no obvious focal weakness,gait within normal limits no abnormal reflexes or asymmetrical SKIN: No acute rashes normal turgor, color, no bruising or petechiae. PSYCH: Oriented, good eye contact, no obvious depression anxiety, cognition and judgment appear normal. LN: no cervical axillary adenopathy  Lab Results  Component Value Date   WBC 7.2 10/11/2023   HGB 13.2 10/11/2023   HCT 40.0 10/11/2023   PLT 302.0 10/11/2023   GLUCOSE 95 10/11/2023   CHOL 272 (H) 10/11/2023   TRIG 139.0 10/11/2023   HDL 83.10 10/11/2023   LDLCALC 161 (H) 10/11/2023   ALT 22 10/11/2023   AST 24 10/11/2023   NA 137 10/11/2023   K 4.2 10/11/2023   CL 98 10/11/2023   CREATININE 0.75 10/11/2023   BUN 18 10/11/2023   CO2 31 10/11/2023   TSH 2.22 10/11/2023   HGBA1C 6.2 10/11/2023   MICROALBUR 1.7 10/11/2023  .lastvi  BP Readings from Last 3 Encounters:  10/11/23 97/67  08/20/23 116/66  01/26/23 127/76  Last vitamin D Lab Results  Component Value Date   VD25OH 55.66 10/11/2023     Lab review lab plan with patient   ASSESSMENT AND PLAN:  Discussed the following assessment and plan:    ICD-10-CM   1. Visit for preventive health examination  Z00.00     2. Medication management  Z79.899 CBC with Differential/Platelet    Comprehensive metabolic panel    Lipid panel    TSH    Hemoglobin A1c    Microalbumin/Creatinine Ratio, Urine    3. Tobacco dependence  F17.200 CBC with Differential/Platelet    Comprehensive metabolic panel    Lipid panel    TSH    Hemoglobin A1c    Microalbumin/Creatinine Ratio, Urine    4. Essential hypertension  I10 CBC with Differential/Platelet    Comprehensive metabolic panel    Lipid panel    TSH    Hemoglobin A1c    Microalbumin/Creatinine Ratio, Urine    5. Elevated blood sugar  R73.9 CBC with Differential/Platelet    Comprehensive  metabolic panel    Lipid panel    TSH    Hemoglobin A1c    Microalbumin/Creatinine Ratio, Urine    6. Vitamin D deficiency  E55.9 Vitamin D, 25-hydroxy    7. Elevated alkaline phosphatase level  R74.8 Vitamin D, 25-hydroxy    8. Abnormal urine odor  R82.90 CBC with Differential/Platelet    Comprehensive metabolic panel    Lipid panel    TSH    Hemoglobin A1c    Microalbumin/Creatinine Ratio, Urine    Vitamin D, 25-hydroxy    Urinalysis, Routine w reflex microscopic    Bp  controlled Tobacco a health risk  counseled pat aware ct screen  shows anatomic emphysema  Also some atherosclerosis     Return for depending on results 6-12 months .  Patient Care Team: Reika Callanan, Neta Mends, MD as PCP - [facility] Patient Instructions  Good to see you today . Lab and urine check .  Then go from there   [place] K.  Cellucci M.D.

## 2023-10-11 ENCOUNTER — Ambulatory Visit (INDEPENDENT_AMBULATORY_CARE_PROVIDER_SITE_OTHER): Payer: PPO | Admitting: Internal Medicine

## 2023-10-11 ENCOUNTER — Encounter: Payer: Self-pay | Admitting: Internal Medicine

## 2023-10-11 VITALS — BP 97/67 | HR 67 | Temp 97.9°F | Ht 64.2 in | Wt 127.6 lb

## 2023-10-11 DIAGNOSIS — F17200 Nicotine dependence, unspecified, uncomplicated: Secondary | ICD-10-CM | POA: Diagnosis not present

## 2023-10-11 DIAGNOSIS — E559 Vitamin D deficiency, unspecified: Secondary | ICD-10-CM

## 2023-10-11 DIAGNOSIS — R8290 Unspecified abnormal findings in urine: Secondary | ICD-10-CM | POA: Diagnosis not present

## 2023-10-11 DIAGNOSIS — Z79899 Other long term (current) drug therapy: Secondary | ICD-10-CM

## 2023-10-11 DIAGNOSIS — I10 Essential (primary) hypertension: Secondary | ICD-10-CM | POA: Diagnosis not present

## 2023-10-11 DIAGNOSIS — R739 Hyperglycemia, unspecified: Secondary | ICD-10-CM | POA: Diagnosis not present

## 2023-10-11 DIAGNOSIS — Z0000 Encounter for general adult medical examination without abnormal findings: Secondary | ICD-10-CM

## 2023-10-11 DIAGNOSIS — R748 Abnormal levels of other serum enzymes: Secondary | ICD-10-CM

## 2023-10-11 LAB — CBC WITH DIFFERENTIAL/PLATELET
Basophils Absolute: 0.1 10*3/uL (ref 0.0–0.1)
Basophils Relative: 0.8 % (ref 0.0–3.0)
Eosinophils Absolute: 0.2 10*3/uL (ref 0.0–0.7)
Eosinophils Relative: 2.4 % (ref 0.0–5.0)
HCT: 40 % (ref 36.0–46.0)
Hemoglobin: 13.2 g/dL (ref 12.0–15.0)
Lymphocytes Relative: 20.8 % (ref 12.0–46.0)
Lymphs Abs: 1.5 10*3/uL (ref 0.7–4.0)
MCHC: 33.1 g/dL (ref 30.0–36.0)
MCV: 94 fL (ref 78.0–100.0)
Monocytes Absolute: 0.5 10*3/uL (ref 0.1–1.0)
Monocytes Relative: 6.8 % (ref 3.0–12.0)
Neutro Abs: 5 10*3/uL (ref 1.4–7.7)
Neutrophils Relative %: 69.2 % (ref 43.0–77.0)
Platelets: 302 10*3/uL (ref 150.0–400.0)
RBC: 4.26 Mil/uL (ref 3.87–5.11)
RDW: 14.1 % (ref 11.5–15.5)
WBC: 7.2 10*3/uL (ref 4.0–10.5)

## 2023-10-11 LAB — URINALYSIS, ROUTINE W REFLEX MICROSCOPIC
Bilirubin Urine: NEGATIVE
Hgb urine dipstick: NEGATIVE
Ketones, ur: NEGATIVE
Nitrite: POSITIVE — AB
Specific Gravity, Urine: 1.02 (ref 1.000–1.030)
Total Protein, Urine: NEGATIVE
Urine Glucose: NEGATIVE
Urobilinogen, UA: 0.2 (ref 0.0–1.0)
pH: 6.5 (ref 5.0–8.0)

## 2023-10-11 LAB — COMPREHENSIVE METABOLIC PANEL
ALT: 22 U/L (ref 0–35)
AST: 24 U/L (ref 0–37)
Albumin: 4.3 g/dL (ref 3.5–5.2)
Alkaline Phosphatase: 96 U/L (ref 39–117)
BUN: 18 mg/dL (ref 6–23)
CO2: 31 meq/L (ref 19–32)
Calcium: 9.3 mg/dL (ref 8.4–10.5)
Chloride: 98 meq/L (ref 96–112)
Creatinine, Ser: 0.75 mg/dL (ref 0.40–1.20)
GFR: 81.83 mL/min (ref >60.00)
Glucose, Bld: 95 mg/dL (ref 70–99)
Potassium: 4.2 meq/L (ref 3.5–5.1)
Sodium: 137 meq/L (ref 135–145)
Total Bilirubin: 0.7 mg/dL (ref 0.2–1.2)
Total Protein: 6.7 g/dL (ref 6.0–8.3)

## 2023-10-11 LAB — LIPID PANEL
Cholesterol: 272 mg/dL — ABNORMAL HIGH (ref 0–200)
HDL: 83.1 mg/dL (ref >39.00)
LDL Cholesterol: 161 mg/dL — ABNORMAL HIGH (ref 0–99)
NonHDL: 189.11
Total CHOL/HDL Ratio: 3
Triglycerides: 139 mg/dL (ref 0.0–149.0)
VLDL: 27.8 mg/dL (ref 0.0–40.0)

## 2023-10-11 LAB — MICROALBUMIN / CREATININE URINE RATIO
Creatinine,U: 108.7 mg/dL
Microalb Creat Ratio: 1.6 mg/g (ref 0.0–30.0)
Microalb, Ur: 1.7 mg/dL (ref 0.0–1.9)

## 2023-10-11 LAB — HEMOGLOBIN A1C: Hgb A1c MFr Bld: 6.2 % (ref 4.6–6.5)

## 2023-10-11 LAB — TSH: TSH: 2.22 u[IU]/mL (ref 0.35–5.50)

## 2023-10-11 LAB — VITAMIN D 25 HYDROXY (VIT D DEFICIENCY, FRACTURES): VITD: 55.66 ng/mL (ref 30.00–100.00)

## 2023-10-11 NOTE — Patient Instructions (Addendum)
  Good to see you today . Lab and urine check .  Then go from there

## 2023-10-13 ENCOUNTER — Encounter: Payer: Self-pay | Admitting: Internal Medicine

## 2023-10-13 NOTE — Progress Notes (Signed)
Urine is consistent with bacteria . If you are having symptoms  we can add antibiotic if not allergic please have team send in septra ds  to take 1 po bid for 5 days disp 10. Would re culture if continuing to have a problem .  Blood results  no diabetes  A1c in prediabetic range. VIt d in good range  All ok except  cholesterol much higher   in a range that may benefit from statin medication to lower vascular event risk .   If agree we can send in  either atorvastatin or rosuvastatin  to lower cholesterol and fu plan .   I will be out of office next week but can address plan on return .

## 2023-10-20 ENCOUNTER — Other Ambulatory Visit (HOSPITAL_COMMUNITY): Payer: Self-pay

## 2023-10-22 ENCOUNTER — Other Ambulatory Visit: Payer: Self-pay

## 2023-10-24 ENCOUNTER — Other Ambulatory Visit (HOSPITAL_COMMUNITY): Payer: Self-pay

## 2023-10-24 ENCOUNTER — Other Ambulatory Visit: Payer: Self-pay

## 2023-10-24 ENCOUNTER — Other Ambulatory Visit: Payer: Self-pay | Admitting: Internal Medicine

## 2023-10-24 ENCOUNTER — Encounter: Payer: Self-pay | Admitting: Internal Medicine

## 2023-10-24 ENCOUNTER — Telehealth (INDEPENDENT_AMBULATORY_CARE_PROVIDER_SITE_OTHER): Payer: PPO | Admitting: Internal Medicine

## 2023-10-24 DIAGNOSIS — I7090 Unspecified atherosclerosis: Secondary | ICD-10-CM

## 2023-10-24 DIAGNOSIS — E785 Hyperlipidemia, unspecified: Secondary | ICD-10-CM

## 2023-10-24 DIAGNOSIS — Z79899 Other long term (current) drug therapy: Secondary | ICD-10-CM

## 2023-10-24 MED ORDER — ROSUVASTATIN CALCIUM 5 MG PO TABS
5.0000 mg | ORAL_TABLET | Freq: Every day | ORAL | 1 refills | Status: DC
  Filled 2023-10-24: qty 90, 90d supply, fill #0
  Filled 2024-01-18: qty 90, 90d supply, fill #1

## 2023-10-24 NOTE — Progress Notes (Signed)
Virtual Visit via Video Note  I connected with Jasmine Nelson on 10/24/23 at  4:00 PM EST by a video enabled telemedicine application and verified that I am speaking with the correct person using two identifiers. Location patient: home Location provider:work office Persons participating in the virtual visit: patient, provider   Patient aware  of the limitations of evaluation and management by telemedicine and  availability of in person appointments. and agreed to proceed.   HPI: Jasmine Nelson presents for video visit in regard to elevated cholesterol noted on her CPE labs.  Her cholesterols generally been in a reasonable range and her lifestyle has not changed that much. She notes that her sister 93 years younger has had high cholesterol is very physically active was placed on statin medicine and had side effects so was on an injectable doing better. Family history maternal grandmother had CVA in her 1s otherwise not positive for vascular disease that is known.    ROS: See pertinent positives and negatives per HPI.  Past Medical History:  Diagnosis Date   Allergy    Anxiety    Cataract    both eyes   Chicken pox    COPD (chronic obstructive pulmonary disease) (HCC)    Depression    Glaucoma    Hx of thyroid disease    3rd grade  resolved    Hypertension    Ingestion of substance    3 04   Osteoporosis    Shift work sleep disorder     Past Surgical History:  Procedure Laterality Date   ADENOIDECTOMY  1959   BREAST BIOPSY Right 2019   COLONOSCOPY      Family History  Problem Relation Age of Onset   Cancer Mother 44       Died at 80 of Non-Hodgkins Lymphoma   Lymphoma Mother        65   Colon polyps Mother        "many"; required regular colonoscopies   Alcohol abuse Father    COPD Father    Depression Father    Hyperlipidemia Father    Hypertension Father        deceased   Colon polyps Father        "many"; required regular colonoscopies   Colon cancer  Sister        dx. 74-56   Other Sister        +CHEK2 mutation   COPD Maternal Aunt    Ovarian cancer Maternal Aunt 62   Stroke Maternal Grandmother    Heart Problems Maternal Grandmother    Cancer Maternal Grandfather 35       Brain Cancer   Arthritis Paternal Grandmother    Depression Paternal Grandmother    Glaucoma Paternal Grandmother    Breast cancer Paternal Grandmother        dx. 50s   Uterine cancer Paternal Grandmother        dx. 60s   Renal cancer Paternal Grandfather        smoker   Multiple myeloma Maternal Uncle        dx. well before his 49s   Ovarian cancer Other    Esophageal cancer Neg Hx    Stomach cancer Neg Hx    Rectal cancer Neg Hx     Social History   Tobacco Use   Smoking status: Every Day    Current packs/day: 0.50    Types: Cigarettes   Smokeless tobacco: Never  Vaping Use  Vaping status: Some Days  Substance Use Topics   Alcohol use: Yes    Comment: occasional wine   Drug use: No      Current Outpatient Medications:    amphetamine-dextroamphetamine (ADDERALL) 20 MG tablet, Take 1 tablet (20 mg total) by mouth 2 (two) times daily (morning and noon)., Disp: 180 tablet, Rfl: 0   Ca Phosphate-Cholecalciferol (CALCIUM WITH D3 PO), Take by mouth., Disp: , Rfl:    clobetasol cream (TEMOVATE) 0.05 %, Apply 1 Application topically 2 (two) times daily., Disp: 60 g, Rfl: 1   cloNIDine (CATAPRES) 0.1 MG tablet, Take 2-3 tablets (0.2-0.3 mg total) by mouth at bedtime., Disp: 270 tablet, Rfl: 0   gabapentin (NEURONTIN) 400 MG capsule, Take 6 capsules (2,400 mg total) by mouth at bedtime., Disp: 720 capsule, Rfl: 1   hydrochlorothiazide (HYDRODIURIL) 12.5 MG tablet, Take 1 tablet (12.5 mg total) by mouth daily. Please schedule appt for further refills, Disp: 30 tablet, Rfl: 0   losartan (COZAAR) 100 MG tablet, Take 1 tablet (100 mg total) by mouth daily., Disp: 90 tablet, Rfl: 1   rosuvastatin (CRESTOR) 5 MG tablet, Take 1 tablet (5 mg total) by mouth  daily., Disp: 90 tablet, Rfl: 1   Suvorexant (BELSOMRA) 20 MG TABS, Take 1 tablet (20 mg total) by mouth at bedtime., Disp: 90 tablet, Rfl: 0   vortioxetine HBr (TRINTELLIX) 20 MG TABS tablet, Take 1 tablet (20 mg total) by mouth daily., Disp: 30 tablet, Rfl: 11   Probiotic Product (ALIGN PO), Take by mouth. (Patient not taking: Reported on 10/24/2023), Disp: , Rfl:    Zinc Sulfate (ZINC 15 PO), Take 1 tablet by mouth as needed. (Patient not taking: Reported on 10/24/2023), Disp: , Rfl:   Current Facility-Administered Medications:    0.9 %  sodium chloride infusion, 500 mL, Intravenous, Once, Nandigam, Eleonore Chiquito, MD  EXAM: BP Readings from Last 3 Encounters:  10/11/23 97/67  08/20/23 116/66  01/26/23 127/76    VITALS per patient if applicable:  GENERAL: alert, oriented, appears well and in no acute distress  HEENT: atraumatic, conjunttiva clear, no obvious abnormalities on inspection of external nose and ears  N PSYCH/NEURO: pleasant and cooperative, no obvious depression or anxiety, speech and thought processing grossly intact Lab Results  Component Value Date   WBC 7.2 10/11/2023   HGB 13.2 10/11/2023   HCT 40.0 10/11/2023   PLT 302.0 10/11/2023   GLUCOSE 95 10/11/2023   CHOL 272 (H) 10/11/2023   TRIG 139.0 10/11/2023   HDL 83.10 10/11/2023   LDLCALC 161 (H) 10/11/2023   ALT 22 10/11/2023   AST 24 10/11/2023   NA 137 10/11/2023   K 4.2 10/11/2023   CL 98 10/11/2023   CREATININE 0.75 10/11/2023   BUN 18 10/11/2023   CO2 31 10/11/2023   TSH 2.22 10/11/2023   HGBA1C 6.2 10/11/2023   MICROALBUR 1.7 10/11/2023   The 10-year ASCVD risk score (Arnett DK, et al., 2019) is: 18.7%   Values used to calculate the score:     Age: 68 years     Sex: Female     Is Non-Hispanic African American: No     Diabetic: Yes     Tobacco smoker: Yes     Systolic Blood Pressure: 97 mmHg     Is BP treated: Yes     HDL Cholesterol: 83.1 mg/dL     Total Cholesterol: 272 mg/dL Review of  previous CT scan lung cancer screening does show coronary artery plaque but not  assessed quantity. ASSESSMENT AND PLAN:  Discussed the following assessment and plan:    ICD-10-CM   1. Hyperlipidemia, unspecified hyperlipidemia type  E78.5     2. Medication management  Z79.899     3. Atherosclerosis on imaging lung screening  I70.90      Attention to lifestyle intervention tobacco avoidance cessation Begin statin medicine and if gets side effects we can adjust change or go a different route.  She should be vitamin D replete. Crestor 5 mg once a day dispense 90 refill plan fasting FLD in about 3 months and go from there.  Of note sister was not able to tolerate statin but uncertain which ones and is on an injectable. Counseled.   Expectant management and discussion of plan and treatment with opportunity to ask questions and all were answered. The patient agreed with the plan and demonstrated an understanding of the instructions.   Advised to call back or seek an in-person evaluation if worsening  or having  further concerns  in interim. Return for 3 mos flp after med begin.    Berniece Andreas, MD

## 2023-11-10 ENCOUNTER — Other Ambulatory Visit: Payer: Self-pay

## 2023-11-10 ENCOUNTER — Other Ambulatory Visit: Payer: Self-pay | Admitting: Internal Medicine

## 2023-11-10 ENCOUNTER — Other Ambulatory Visit (HOSPITAL_COMMUNITY): Payer: Self-pay

## 2023-11-10 MED ORDER — HYDROCHLOROTHIAZIDE 12.5 MG PO TABS
12.5000 mg | ORAL_TABLET | Freq: Every day | ORAL | 0 refills | Status: DC
  Filled 2023-11-10: qty 30, 30d supply, fill #0

## 2023-11-16 ENCOUNTER — Other Ambulatory Visit: Payer: Self-pay

## 2023-11-16 ENCOUNTER — Other Ambulatory Visit (HOSPITAL_COMMUNITY): Payer: Self-pay

## 2023-11-16 MED ORDER — CLONIDINE HCL 0.1 MG PO TABS
0.2000 mg | ORAL_TABLET | Freq: Every day | ORAL | 0 refills | Status: DC
Start: 2023-11-16 — End: 2024-02-28
  Filled 2023-11-16: qty 270, 90d supply, fill #0

## 2023-11-21 DIAGNOSIS — Z01419 Encounter for gynecological examination (general) (routine) without abnormal findings: Secondary | ICD-10-CM | POA: Diagnosis not present

## 2023-11-21 DIAGNOSIS — Z1231 Encounter for screening mammogram for malignant neoplasm of breast: Secondary | ICD-10-CM | POA: Diagnosis not present

## 2023-11-21 DIAGNOSIS — Z6820 Body mass index (BMI) 20.0-20.9, adult: Secondary | ICD-10-CM | POA: Diagnosis not present

## 2023-11-28 ENCOUNTER — Other Ambulatory Visit (HOSPITAL_COMMUNITY): Payer: PPO

## 2023-12-04 ENCOUNTER — Other Ambulatory Visit (HOSPITAL_COMMUNITY)
Admission: RE | Admit: 2023-12-04 | Discharge: 2023-12-04 | Disposition: A | Discharge status: Home / Self Care | Payer: Self-pay | Source: Ambulatory Visit | Attending: Oncology | Admitting: Oncology

## 2023-12-04 DIAGNOSIS — Z006 Encounter for examination for normal comparison and control in clinical research program: Secondary | ICD-10-CM | POA: Insufficient documentation

## 2023-12-05 ENCOUNTER — Other Ambulatory Visit (HOSPITAL_COMMUNITY): Payer: Self-pay

## 2023-12-05 ENCOUNTER — Other Ambulatory Visit: Payer: Self-pay | Admitting: Family

## 2023-12-09 ENCOUNTER — Other Ambulatory Visit (HOSPITAL_COMMUNITY): Payer: Self-pay

## 2023-12-10 ENCOUNTER — Other Ambulatory Visit: Payer: Self-pay

## 2023-12-10 ENCOUNTER — Other Ambulatory Visit (HOSPITAL_COMMUNITY): Payer: Self-pay

## 2023-12-10 MED ORDER — BELSOMRA 20 MG PO TABS
1.0000 | ORAL_TABLET | Freq: Every day | ORAL | 0 refills | Status: DC
  Filled 2023-12-10 – 2023-12-14 (×2): qty 90, 90d supply, fill #0

## 2023-12-12 ENCOUNTER — Other Ambulatory Visit (HOSPITAL_COMMUNITY): Payer: Self-pay

## 2023-12-13 ENCOUNTER — Other Ambulatory Visit (HOSPITAL_COMMUNITY): Payer: Self-pay

## 2023-12-13 ENCOUNTER — Other Ambulatory Visit: Payer: Self-pay

## 2023-12-13 NOTE — Telephone Encounter (Signed)
 Copied from CRM [id number]. Topic: Clinical - Prescription Issue >> Dec 13, 2023  1:46 PM Lovey Newcomer R wrote: Reason for CRM: For hydrochlorothiazide (HYDRODIURIL) 12.5 MG tablet, patient says she is not coming back in for an appointment as she has already seen the doctor 10/11/23 and should not have to come in again for a refill. Please follow up with patient for explanation.

## 2023-12-14 ENCOUNTER — Other Ambulatory Visit: Payer: Self-pay

## 2023-12-14 ENCOUNTER — Other Ambulatory Visit (HOSPITAL_COMMUNITY): Payer: Self-pay

## 2023-12-14 LAB — GENECONNECT MOLECULAR SCREEN: Genetic Analysis Overall Interpretation: NEGATIVE

## 2023-12-14 MED ORDER — HYDROCHLOROTHIAZIDE 12.5 MG PO TABS
12.5000 mg | ORAL_TABLET | Freq: Every day | ORAL | 6 refills | Status: DC
  Filled 2023-12-14: qty 30, 30d supply, fill #0
  Filled 2024-01-14: qty 30, 30d supply, fill #1
  Filled 2024-02-09: qty 30, 30d supply, fill #2
  Filled 2024-03-10: qty 30, 30d supply, fill #3
  Filled 2024-04-09: qty 30, 30d supply, fill #4
  Filled 2024-05-07: qty 30, 30d supply, fill #5
  Filled 2024-06-03: qty 30, 30d supply, fill #6

## 2023-12-17 ENCOUNTER — Other Ambulatory Visit (HOSPITAL_COMMUNITY): Payer: Self-pay

## 2023-12-17 ENCOUNTER — Other Ambulatory Visit: Payer: Self-pay

## 2023-12-18 ENCOUNTER — Other Ambulatory Visit: Payer: Self-pay

## 2023-12-28 ENCOUNTER — Other Ambulatory Visit (HOSPITAL_COMMUNITY): Payer: Self-pay

## 2024-01-02 ENCOUNTER — Other Ambulatory Visit: Payer: Self-pay

## 2024-01-02 ENCOUNTER — Other Ambulatory Visit (HOSPITAL_COMMUNITY): Payer: Self-pay

## 2024-01-02 DIAGNOSIS — L281 Prurigo nodularis: Secondary | ICD-10-CM | POA: Diagnosis not present

## 2024-01-02 MED ORDER — CEPHALEXIN 500 MG PO CAPS
ORAL_CAPSULE | ORAL | 0 refills | Status: DC
Start: 2024-01-02 — End: 2024-01-04
  Filled 2024-01-02: qty 2, 1d supply, fill #0

## 2024-01-02 MED ORDER — HYDROXYZINE HCL 25 MG PO TABS
25.0000 mg | ORAL_TABLET | ORAL | 0 refills | Status: DC | PRN
  Filled 2024-01-02: qty 60, 30d supply, fill #0

## 2024-01-04 ENCOUNTER — Other Ambulatory Visit: Payer: Self-pay

## 2024-01-04 ENCOUNTER — Other Ambulatory Visit (HOSPITAL_COMMUNITY): Payer: Self-pay

## 2024-01-04 MED ORDER — HYDROCODONE-ACETAMINOPHEN 10-325 MG PO TABS
1.0000 | ORAL_TABLET | Freq: Four times a day (QID) | ORAL | 0 refills | Status: DC | PRN
  Filled 2024-01-04 (×2): qty 12, 3d supply, fill #0

## 2024-01-04 MED ORDER — CEPHALEXIN 500 MG PO CAPS
500.0000 mg | ORAL_CAPSULE | Freq: Two times a day (BID) | ORAL | 0 refills | Status: DC
  Filled 2024-01-04 (×2): qty 10, 5d supply, fill #0

## 2024-01-05 ENCOUNTER — Other Ambulatory Visit (HOSPITAL_COMMUNITY): Payer: Self-pay

## 2024-01-05 MED ORDER — GABAPENTIN 400 MG PO CAPS
2400.0000 mg | ORAL_CAPSULE | Freq: Every day | ORAL | 1 refills | Status: DC
  Filled 2024-01-07: qty 540, 90d supply, fill #0
  Filled 2024-04-06: qty 540, 90d supply, fill #1
  Filled 2024-07-07: qty 360, 60d supply, fill #2

## 2024-01-07 ENCOUNTER — Other Ambulatory Visit (HOSPITAL_COMMUNITY): Payer: Self-pay

## 2024-01-15 ENCOUNTER — Ambulatory Visit: Payer: Self-pay

## 2024-01-15 ENCOUNTER — Other Ambulatory Visit: Payer: Self-pay

## 2024-01-15 ENCOUNTER — Other Ambulatory Visit (HOSPITAL_COMMUNITY): Payer: Self-pay

## 2024-01-15 NOTE — Telephone Encounter (Signed)
 Spoke to Jasmine Nelson. Inform Jasmine Nelson the same thing as Jasmine Nelson did on the opening schedule. Also inform Jasmine Nelson, Dr. Ethel Henry review the message and voice that Jasmine Nelson to go to UC if doesn't want to go to ED. Jasmine Nelson verbalized understanding.   Jasmine Nelson states she is not going today but will go tomorrow morning.   Forwarding to provider for FYI.

## 2024-01-15 NOTE — Telephone Encounter (Signed)
 Chief Complaint: abdominal pain Symptoms: abdominal pain, nausea, abdominal bloating, constipation, not passing gas Frequency: 3 wks Pertinent Negatives: Patient denies CP, SOB, fever, diarrhea, vomiting  Disposition: [x] ED /[] Urgent Care (no appt availability in office) / [] Appointment(In office/virtual)/ []  [facility] [facility]/ [] Home Care/ [x] Refused Recommended Disposition /[] [place] [facility]/ []  Follow-up with PCP Additional Notes: Pt reports 7/10 L-sided abdominal pain that radiates around her back. Pt states pain is worse with deep breathing and exacerbated by certain movements. Pt endorses nausea which is impacting her appetite. Pt endorses she is able to tolerate fluids and is drinking normally. Pt also endorses constipation and states she is not passing gas. Pt also endorses abdominal bloating. RN advised pt she needs to go to the ED. RN educated pt on why the ED is the best place for her symptoms. Pt verbalized understanding but declined at this time, stating she would rather be seen in the office and avoid extra charges. No availability in the office until Thursday and this RN believes pt should not wait until then to be seen. RN called the CAL to make them aware. RN advised pt RN will relay situation to the office for follow-up. Pt verbalized understanding.    Copied from CRM [id number]. Topic: Clinical - Red Word Triage >> Jan 15, 2024  4:03 PM Melissa C wrote: Red Word that prompted transfer to Nurse Triage: patient has been having left sided abdominal over the past three weeks that hasn't gotten any better and increased over time. Reason for Disposition  [1] SEVERE pain (e.g., excruciating) AND [2] present > 1 hour  Answer Assessment - Initial Assessment Questions 1. LOCATION: "Where does it hurt?"      L-sided abd pain for 3 wks that has gotten worse/not better 2. RADIATION: "Does the pain shoot anywhere else?" (e.g., chest, back)     Around to the back  3. ONSET: "When  did the pain begin?" (e.g., minutes, hours or days ago)      3 wks 4. SUDDEN: "Gradual or sudden onset?"     Gradually getting worse 5. PATTERN "Does the pain come and go, or is it constant?"    - If it comes and goes: "How long does it last?" "Do you have pain now?"     (Note: Comes and goes means the pain is intermittent. It goes away completely between bouts.)    - If constant: "Is it getting better, staying the same, or getting worse?"      (Note: Constant means the pain never goes away completely; most serious pain is constant and gets worse.)      Yes  6. SEVERITY: "How bad is the pain?"  (e.g., Scale 1-10; mild, moderate, or severe)    - MILD (1-3): Doesn't interfere with normal activities, abdomen soft and not tender to touch.     - MODERATE (4-7): Interferes with normal activities or awakens from sleep, abdomen tender to touch.     - SEVERE (8-10): Excruciating pain, doubled over, unable to do any normal activities.       7/10 - very uncomfortable, inhibiting activities  7. RECURRENT SYMPTOM: "Have you ever had this type of stomach pain before?" If Yes, ask: "When was the last time?" and "What happened that time?"      No  8. CAUSE: "What do you think is causing the stomach pain?"     Not sure  9. RELIEVING/AGGRAVATING FACTORS: "What makes it better or worse?" (e.g., antacids, bending or twisting motion,  bowel movement)     Worse with certain movements, better lying down 10. OTHER SYMPTOMS: "Do you have any other symptoms?" (e.g., back pain, diarrhea, fever, urination pain, vomiting)       Nausea. Pain hurts "when I breathe or move." Denies vomiting. Endorses abd bloating. Endorses constipation. Trouble passing gas. States she has a BM "but it's not very much." Denies SOB. Pt states "it hurts to breathe" "every time I take a breath I feel the pain (mostly L-sided abdomen)." Denies fever or chills. Difficult  to eat d/t nausea. Able to drink fluids normally. "Pain goes around from the L  abdomen into the back." Denies urinary symptoms that she has noticed. Denies rectal bleeding. Pt states she took pain medication a while ago for a dental appt but states symptoms started before dental appt.  Protocols used: Abdominal Pain - Female-A-AH

## 2024-01-18 ENCOUNTER — Other Ambulatory Visit (HOSPITAL_COMMUNITY): Payer: Self-pay

## 2024-01-30 ENCOUNTER — Ambulatory Visit: Payer: Self-pay

## 2024-01-30 NOTE — Telephone Encounter (Signed)
 Chief Complaint: pain Symptoms: pain Frequency: constant Pertinent Negatives: Patient denies fever, swelling, tingling, weakness, numbness, redness Disposition: [] ED /[] Urgent Care (no appt availability in office) / [x] Appointment(In office/virtual)/ []  [facility]/ [] Home Care/ [] Refused Recommended Disposition /[] [facility] [facility]/ []  Follow-up with PCP Additional Notes:  "Discomfort just above hip on iliac crest" on left side. Symptoms started about 4 weeks ago. Pain is 8/10. Nothing seems to relieve pain. Ambulation makes it worse, however she is not having difficulty ambulating. Denies all other symptoms. Requests appointment with PCP only. Scheduled acute visit with PCP on 02/06/24. Educated on care advice as documented in protocol, patient verbalized understanding. Discussed reasons to call back.     Copied from CRM [id number]. Topic: Clinical - Red Word Triage >> Jan 30, 2024  1:36 PM Kita Perish H wrote: [facility] that prompted transfer to Nurse Triage: Tenderness discomfort pain on left side above hip Reason for Disposition  Hip pain is a chronic symptom (recurrent or ongoing AND present > 4 weeks)  Protocols used: Hip Pain-A-AH

## 2024-02-05 NOTE — Progress Notes (Unsigned)
 No chief complaint on file.   HPI: Jasmine Nelson 68 y.o. come in for ongoing  issues  ROS: See pertinent positives and negatives per HPI.  Past Medical History:  Diagnosis Date   Allergy    Anxiety    Cataract    both eyes   Chicken pox    COPD (chronic obstructive pulmonary disease) (HCC)    Depression    Glaucoma    Hx of thyroid  disease    3rd grade  resolved    Hypertension    Ingestion of substance    3 04   Osteoporosis    Shift work sleep disorder     Family History  Problem Relation Age of Onset   Cancer Mother 24       Died at 61 of Non-Hodgkins Lymphoma   Lymphoma Mother        1997   Colon polyps Mother        "many"; required regular colonoscopies   Alcohol abuse Father    COPD Father    Depression Father    Hyperlipidemia Father    Hypertension Father        deceased   Colon polyps Father        "many"; required regular colonoscopies   Colon cancer Sister        dx. 25-56   Other Sister        +CHEK2 mutation   COPD Maternal Aunt    Ovarian cancer Maternal Aunt 62   Stroke Maternal Grandmother    Heart Problems Maternal Grandmother    Cancer Maternal Grandfather 20       Brain Cancer   Arthritis Paternal Grandmother    Depression Paternal Grandmother    Glaucoma Paternal Grandmother    Breast cancer Paternal Grandmother        dx. 50s   Uterine cancer Paternal Grandmother        dx. 60s   Renal cancer Paternal Grandfather        smoker   Multiple myeloma Maternal Uncle        dx. well before his 30s   Ovarian cancer Other    Esophageal cancer Neg Hx    Stomach cancer Neg Hx    Rectal cancer Neg Hx     Social History   Socioeconomic History   Marital status: Married    Spouse name: Not on file   Number of children: Not on file   Years of education: Not on file   Highest education level: Not on file  Occupational History   Not on file  Tobacco Use   Smoking status: Every Day    Current packs/day: 0.50    Types: Cigarettes    Smokeless tobacco: Never  Vaping Use   Vaping status: Some Days  Substance and Sexual Activity   Alcohol use: Yes    Comment: occasional wine   Drug use: No   Sexual activity: Not on file  Other Topics Concern   Not on file  Social History Narrative   Averages 5-8 hours of sleep per day.   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   P0G0      Social Drivers of Corporate investment banker Strain: Low Risk  (  06/04/2023)   Overall Financial Resource Strain (CARDIA)    Difficulty of Paying Living Expenses: Not hard at all  Food Insecurity: No Food Insecurity (06/04/2023)   Hunger Vital Sign    Worried About Running Out of Food in the Last Year: Never true    Ran Out of Food in the Last Year: Never true  Transportation Needs: No Transportation Needs (06/04/2023)   PRAPARE - Administrator, Civil Service (Medical): No    Lack of Transportation (Non-Medical): No  Physical Activity: Inactive (10/11/2023)   Exercise Vital Sign    Days of Exercise per Week: 0 days    Minutes of Exercise per Session: 0 min  Stress: No Stress Concern Present (10/11/2023)   [employer] of Occupational Health - Occupational Stress Questionnaire    Feeling of Stress : Not at all  Social Connections: Socially Isolated (10/11/2023)   Social Connection and Isolation Panel [NHANES]    Frequency of Communication with Friends and Family: Once a week    Frequency of Social Gatherings with Friends and Family: Never    Attends Religious Services: Never    Database administrator or Organizations: No    Attends Engineer, structural: Never    Marital Status: Married    Outpatient Medications Prior to Visit  Medication Sig Dispense Refill   amphetamine -dextroamphetamine  (ADDERALL) 20 MG tablet Take 1 tablet (20 mg total) by mouth 2 (two) times daily  (morning and noon). 180 tablet 0   Ca Phosphate-Cholecalciferol (CALCIUM  WITH D3 PO) Take by mouth.     cephALEXin  (KEFLEX ) 500 MG capsule Take 1 capsule (500 mg total) by mouth 2 (two) times daily until gone. 10 capsule 0   clobetasol  cream (TEMOVATE ) 0.05 % Apply 1 Application topically 2 (two) times daily. 60 g 1   cloNIDine  (CATAPRES ) 0.1 MG tablet Take 2-3 tablets (0.2-0.3 mg total) by mouth at bedtime. 270 tablet 0   gabapentin  (NEURONTIN ) 400 MG capsule Take 6 capsules (2,400 mg total) by mouth at bedtime. 720 capsule 1   hydrochlorothiazide  (HYDRODIURIL ) 12.5 MG tablet Take 1 tablet (12.5 mg total) by mouth daily. Please schedule appt for further refills 30 tablet 6   HYDROcodone -acetaminophen  (NORCO) 10-325 MG tablet Take 1 tablet by mouth every 6 (six) hours as needed for pain. 12 tablet 0   hydrOXYzine  (ATARAX ) 25 MG tablet Take 1 tablet (25 mg total) by mouth once daily as needed. Can do 2 tablets if needed. 60 tablet 0   losartan  (COZAAR ) 100 MG tablet Take 1 tablet (100 mg total) by mouth daily. 90 tablet 1   Probiotic Product (ALIGN PO) Take by mouth. (Patient not taking: Reported on 10/24/2023)     rosuvastatin  (CRESTOR ) 5 MG tablet Take 1 tablet (5 mg total) by mouth daily. 90 tablet 1   Suvorexant  (BELSOMRA ) 20 MG TABS Take 1 tablet (20 mg total) by mouth at bedtime. 90 tablet 0   Suvorexant  (BELSOMRA ) 20 MG TABS Take 1 tablet (20 mg total) by mouth at bedtime. 90 tablet 0   vortioxetine  HBr (TRINTELLIX ) 20 MG TABS tablet Take 1 tablet (20 mg total) by mouth daily. 30 tablet 11   Zinc Sulfate (ZINC 15 PO) Take 1 tablet by mouth as needed. (Patient not taking: Reported on 10/24/2023)     Facility-Administered Medications Prior to Visit  Medication Dose Route Frequency Provider Last Rate Last Admin   0.9 %  sodium chloride  infusion  500 mL Intravenous Once Nandigam, Kavitha V, MD  EXAM:  There were no vitals taken for this visit.  There is no height or weight on file  to calculate BMI.  GENERAL: vitals reviewed and listed above, alert, oriented, appears well hydrated and in no acute distress HEENT: atraumatic, conjunctiva  clear, no obvious abnormalities on inspection of external nose and ears OP : no lesion edema or exudate  NECK: no obvious masses on inspection palpation  LUNGS: clear to auscultation bilaterally, no wheezes, rales or rhonchi, good air movement CV: HRRR, no clubbing cyanosis or  peripheral edema nl cap refill  MS: moves all extremities without noticeable focal  abnormality PSYCH: pleasant and cooperative, no obvious depression or anxiety Lab Results  Component Value Date   WBC 7.2 10/11/2023   HGB 13.2 10/11/2023   HCT 40.0 10/11/2023   PLT 302.0 10/11/2023   GLUCOSE 95 10/11/2023   CHOL 272 (H) 10/11/2023   TRIG 139.0 10/11/2023   HDL 83.10 10/11/2023   LDLCALC 161 (H) 10/11/2023   ALT 22 10/11/2023   AST 24 10/11/2023   NA 137 10/11/2023   K 4.2 10/11/2023   CL 98 10/11/2023   CREATININE 0.75 10/11/2023   BUN 18 10/11/2023   CO2 31 10/11/2023   TSH 2.22 10/11/2023   HGBA1C 6.2 10/11/2023   MICROALBUR 1.7 10/11/2023   BP Readings from Last 3 Encounters:  10/11/23 97/67  08/20/23 116/66  01/26/23 127/76    ASSESSMENT AND PLAN:  Discussed the following assessment and plan:  No diagnosis found.  -Patient advised to return or notify health care team  if  new concerns arise.  There are no Patient Instructions on file for this visit.   Cerenity Goshorn K. Jhania Etherington M.D.

## 2024-02-06 ENCOUNTER — Encounter: Payer: Self-pay | Admitting: Internal Medicine

## 2024-02-06 ENCOUNTER — Ambulatory Visit: Payer: Self-pay | Admitting: Internal Medicine

## 2024-02-06 ENCOUNTER — Ambulatory Visit: Admitting: Internal Medicine

## 2024-02-06 VITALS — BP 122/80 | HR 86 | Temp 98.1°F | Ht 64.2 in | Wt 116.2 lb

## 2024-02-06 DIAGNOSIS — E559 Vitamin D deficiency, unspecified: Secondary | ICD-10-CM | POA: Diagnosis not present

## 2024-02-06 DIAGNOSIS — Z79899 Other long term (current) drug therapy: Secondary | ICD-10-CM | POA: Diagnosis not present

## 2024-02-06 DIAGNOSIS — R110 Nausea: Secondary | ICD-10-CM

## 2024-02-06 DIAGNOSIS — E785 Hyperlipidemia, unspecified: Secondary | ICD-10-CM | POA: Diagnosis not present

## 2024-02-06 DIAGNOSIS — I7090 Unspecified atherosclerosis: Secondary | ICD-10-CM | POA: Diagnosis not present

## 2024-02-06 DIAGNOSIS — R109 Unspecified abdominal pain: Secondary | ICD-10-CM

## 2024-02-06 LAB — CBC WITH DIFFERENTIAL/PLATELET
Basophils Absolute: 0 10*3/uL (ref 0.0–0.1)
Basophils Relative: 0.5 % (ref 0.0–3.0)
Eosinophils Absolute: 0.1 10*3/uL (ref 0.0–0.7)
Eosinophils Relative: 2.3 % (ref 0.0–5.0)
HCT: 41.8 % (ref 36.0–46.0)
Hemoglobin: 13.8 g/dL (ref 12.0–15.0)
Lymphocytes Relative: 25.7 % (ref 12.0–46.0)
Lymphs Abs: 1.5 10*3/uL (ref 0.7–4.0)
MCHC: 32.9 g/dL (ref 30.0–36.0)
MCV: 97.3 fl (ref 78.0–100.0)
Monocytes Absolute: 0.6 10*3/uL (ref 0.1–1.0)
Monocytes Relative: 10 % (ref 3.0–12.0)
Neutro Abs: 3.5 10*3/uL (ref 1.4–7.7)
Neutrophils Relative %: 61.5 % (ref 43.0–77.0)
Platelets: 287 10*3/uL (ref 150.0–400.0)
RBC: 4.3 Mil/uL (ref 3.87–5.11)
RDW: 13.3 % (ref 11.5–15.5)
WBC: 5.7 10*3/uL (ref 4.0–10.5)

## 2024-02-06 LAB — LIPID PANEL
Cholesterol: 179 mg/dL (ref 0–200)
HDL: 80.2 mg/dL (ref >39.00)
LDL Cholesterol: 75 mg/dL (ref 0–99)
NonHDL: 99.13
Total CHOL/HDL Ratio: 2
Triglycerides: 119 mg/dL (ref 0.0–149.0)
VLDL: 23.8 mg/dL (ref 0.0–40.0)

## 2024-02-06 LAB — COMPREHENSIVE METABOLIC PANEL WITH GFR
ALT: 21 U/L (ref 0–35)
AST: 23 U/L (ref 0–37)
Albumin: 4.3 g/dL (ref 3.5–5.2)
Alkaline Phosphatase: 99 U/L (ref 39–117)
BUN: 15 mg/dL (ref 6–23)
CO2: 28 meq/L (ref 19–32)
Calcium: 9.5 mg/dL (ref 8.4–10.5)
Chloride: 98 meq/L (ref 96–112)
Creatinine, Ser: 0.7 mg/dL (ref 0.40–1.20)
GFR: 88.69 mL/min (ref >60.00)
Glucose, Bld: 88 mg/dL (ref 70–99)
Potassium: 5 meq/L (ref 3.5–5.1)
Sodium: 134 meq/L — ABNORMAL LOW (ref 135–145)
Total Bilirubin: 0.5 mg/dL (ref 0.2–1.2)
Total Protein: 6.6 g/dL (ref 6.0–8.3)

## 2024-02-06 LAB — HEPATIC FUNCTION PANEL
ALT: 21 U/L (ref 0–35)
AST: 23 U/L (ref 0–37)
Albumin: 4.3 g/dL (ref 3.5–5.2)
Alkaline Phosphatase: 99 U/L (ref 39–117)
Bilirubin, Direct: 0.1 mg/dL (ref 0.0–0.3)
Total Bilirubin: 0.5 mg/dL (ref 0.2–1.2)
Total Protein: 6.6 g/dL (ref 6.0–8.3)

## 2024-02-06 LAB — POCT URINALYSIS DIPSTICK
Bilirubin, UA: NEGATIVE
Blood, UA: NEGATIVE
Glucose, UA: NEGATIVE
Ketones, UA: NEGATIVE
Nitrite, UA: NEGATIVE
Protein, UA: POSITIVE — AB
Spec Grav, UA: 1.015 (ref 1.010–1.025)
Urobilinogen, UA: 0.2 U/dL
pH, UA: 7 (ref 5.0–8.0)

## 2024-02-06 LAB — VITAMIN D 25 HYDROXY (VIT D DEFICIENCY, FRACTURES): VITD: 79.58 ng/mL (ref 30.00–100.00)

## 2024-02-06 NOTE — Patient Instructions (Addendum)
 Not sure   cause of pain but want to get abd and pelvic ct .    Checking also for uti .  If gets worse  contact team.  Update lab today  Keep up with fluids .

## 2024-02-06 NOTE — Progress Notes (Signed)
 Blood  work ok  except  sodium level one point down 134  uncertain significance   lipid panel in optimal  range

## 2024-02-08 LAB — URINE CULTURE
MICRO NUMBER:: 16454829
SPECIMEN QUALITY:: ADEQUATE

## 2024-02-11 ENCOUNTER — Other Ambulatory Visit (HOSPITAL_BASED_OUTPATIENT_CLINIC_OR_DEPARTMENT_OTHER): Payer: Self-pay

## 2024-02-11 ENCOUNTER — Other Ambulatory Visit (HOSPITAL_COMMUNITY): Payer: Self-pay

## 2024-02-11 ENCOUNTER — Ambulatory Visit (HOSPITAL_BASED_OUTPATIENT_CLINIC_OR_DEPARTMENT_OTHER)
Admission: RE | Admit: 2024-02-11 | Discharge: 2024-02-11 | Disposition: A | Discharge status: Home / Self Care | Source: Ambulatory Visit | Attending: Internal Medicine | Admitting: Internal Medicine

## 2024-02-11 DIAGNOSIS — R102 Pelvic and perineal pain: Secondary | ICD-10-CM | POA: Diagnosis not present

## 2024-02-11 DIAGNOSIS — R110 Nausea: Secondary | ICD-10-CM

## 2024-02-11 DIAGNOSIS — R10A0 Flank pain, unspecified side: Secondary | ICD-10-CM

## 2024-02-11 DIAGNOSIS — R109 Unspecified abdominal pain: Secondary | ICD-10-CM | POA: Diagnosis not present

## 2024-02-11 MED ORDER — IOHEXOL 350 MG/ML SOLN
100.0000 mL | Freq: Once | INTRAVENOUS | Status: AC | PRN
  Administered 2024-02-11: 75 mL via INTRAVENOUS

## 2024-02-11 NOTE — Progress Notes (Signed)
 Ur cx  showed enterococcus   which amy or may not be causing the problem    But should treat Please send in amoxicillin 500 mg to take q 6  hours  for 5 days  disp 20 for uti

## 2024-02-12 ENCOUNTER — Telehealth: Payer: Self-pay

## 2024-02-12 NOTE — Progress Notes (Signed)
 Ct scan snow no concerning problems  ( except bones show decrease density.Aaron Aas)  reassuring  Lets see if you sx get better after  treating the uti

## 2024-02-12 NOTE — Telephone Encounter (Signed)
 Copied from CRM [id number]. Topic: Clinical - Medication Question >> Feb 11, 2024 11:07 AM Adonis Hoot wrote: Reason for CRM: Patient received notification through mychart that she would be prescribed amoxic  ,she would like to know if it would be sent to pharmacy today so that she can receive through mail order tomorrow through [facility].

## 2024-02-13 ENCOUNTER — Other Ambulatory Visit: Payer: Self-pay

## 2024-02-13 ENCOUNTER — Other Ambulatory Visit (HOSPITAL_COMMUNITY): Payer: Self-pay

## 2024-02-13 MED ORDER — AMOXICILLIN 500 MG PO CAPS
500.0000 mg | ORAL_CAPSULE | Freq: Four times a day (QID) | ORAL | 0 refills | Status: AC
  Filled 2024-02-13: qty 20, 5d supply, fill #0

## 2024-02-13 NOTE — Telephone Encounter (Signed)
 Attempted to reach pt to inform her that Rx sent to [facility] long pharmacy. Left a detail message and to call us  back if have any questions.

## 2024-02-28 ENCOUNTER — Other Ambulatory Visit (HOSPITAL_COMMUNITY): Payer: Self-pay

## 2024-02-28 MED ORDER — CLONIDINE HCL 0.1 MG PO TABS
0.2000 mg | ORAL_TABLET | Freq: Every day | ORAL | 0 refills | Status: DC
  Filled 2024-02-28: qty 90, 30d supply, fill #0

## 2024-02-29 ENCOUNTER — Other Ambulatory Visit: Payer: Self-pay

## 2024-03-07 ENCOUNTER — Other Ambulatory Visit (HOSPITAL_COMMUNITY): Payer: Self-pay

## 2024-03-07 ENCOUNTER — Other Ambulatory Visit: Payer: Self-pay

## 2024-03-07 MED ORDER — BELSOMRA 20 MG PO TABS
1.0000 | ORAL_TABLET | Freq: Every day | ORAL | 0 refills | Status: DC
  Filled 2024-03-20: qty 90, 90d supply, fill #0

## 2024-03-07 MED ORDER — AMPHETAMINE-DEXTROAMPHETAMINE 20 MG PO TABS
20.0000 mg | ORAL_TABLET | Freq: Two times a day (BID) | ORAL | 0 refills | Status: DC
  Filled 2024-03-07: qty 180, 90d supply, fill #0

## 2024-03-10 ENCOUNTER — Other Ambulatory Visit (HOSPITAL_COMMUNITY): Payer: Self-pay

## 2024-03-12 ENCOUNTER — Other Ambulatory Visit (HOSPITAL_COMMUNITY): Payer: Self-pay

## 2024-03-12 ENCOUNTER — Other Ambulatory Visit: Payer: Self-pay

## 2024-03-21 ENCOUNTER — Other Ambulatory Visit: Payer: Self-pay

## 2024-03-21 ENCOUNTER — Other Ambulatory Visit (HOSPITAL_COMMUNITY): Payer: Self-pay

## 2024-03-22 ENCOUNTER — Other Ambulatory Visit (HOSPITAL_COMMUNITY): Payer: Self-pay

## 2024-03-23 ENCOUNTER — Other Ambulatory Visit (HOSPITAL_COMMUNITY): Payer: Self-pay

## 2024-03-24 ENCOUNTER — Other Ambulatory Visit: Payer: Self-pay

## 2024-03-24 ENCOUNTER — Other Ambulatory Visit (HOSPITAL_COMMUNITY): Payer: Self-pay

## 2024-03-24 MED ORDER — CLONIDINE HCL 0.1 MG PO TABS
0.2000 mg | ORAL_TABLET | Freq: Every day | ORAL | 5 refills | Status: DC
  Filled 2024-03-24: qty 90, 30d supply, fill #0
  Filled 2024-04-24: qty 90, 30d supply, fill #1
  Filled 2024-05-24: qty 90, 30d supply, fill #2
  Filled 2024-06-22: qty 90, 30d supply, fill #3
  Filled 2024-07-22: qty 90, 30d supply, fill #4
  Filled 2024-08-20: qty 90, 30d supply, fill #5

## 2024-03-25 ENCOUNTER — Other Ambulatory Visit: Payer: Self-pay | Admitting: Internal Medicine

## 2024-03-25 MED ORDER — LOSARTAN POTASSIUM 100 MG PO TABS
100.0000 mg | ORAL_TABLET | Freq: Every day | ORAL | 1 refills | Status: DC
  Filled 2024-03-25: qty 90, 90d supply, fill #0
  Filled 2024-06-25: qty 90, 90d supply, fill #1

## 2024-03-26 ENCOUNTER — Other Ambulatory Visit (HOSPITAL_COMMUNITY): Payer: Self-pay

## 2024-03-26 ENCOUNTER — Other Ambulatory Visit: Payer: Self-pay

## 2024-04-07 ENCOUNTER — Encounter: Payer: Self-pay | Admitting: Podiatrist

## 2024-04-07 ENCOUNTER — Other Ambulatory Visit (HOSPITAL_COMMUNITY): Payer: Self-pay

## 2024-04-07 ENCOUNTER — Ambulatory Visit: Admitting: Podiatrist

## 2024-04-07 VITALS — Ht 64.2 in | Wt 116.2 lb

## 2024-04-07 DIAGNOSIS — L84 Corns and callosities: Secondary | ICD-10-CM

## 2024-04-07 NOTE — Patient Instructions (Signed)
 Corns and Calluses: What to Know     Corns and calluses are skin conditions that can cause discomfort. Corns are small areas of thickened skin that usually form on a toe. They have a cone-shaped core that can press on nerves, causing pain. Calluses are larger areas of thickened skin that can form anywhere on the body. They often show up on the hands, soles of the feet, and heels. Calluses don't have a pointy core like corns do. What are the causes? Corns and calluses are caused by rubbing or pressure. Tight or bad-fitting shoes are often what cause these things. What increases the risk? Corns Corns are more likely to develop in people who have misshapen toes, such as hammertoes. Calluses Calluses are more likely to develop in people who: Work with their hands. Wear shoes that fit poorly, are too tight, or have high heels. Have misshapen toes. Some conditions make you more likely to get calluses and can lead to problems like ulceration or infection. These include: Diabetes. Poor blood flow. Numbness in your feet. What are the signs or symptoms? Symptoms of a corn or callus include: A hard growth on the skin. Pain or tenderness under the skin. Redness and swelling. More discomfort when wearing tight shoes, if your feet are affected. If a corn or callus becomes infected, symptoms may include: Redness and swelling that gets worse. Pain. Fluid, blood, or pus coming out of the corn or callus. How is this diagnosed? Corns and calluses may be diagnosed based on your: Symptoms. Medical history. Physical exam. How is this treated? Treatment for corns and calluses may include: Fixing the cause, like changing shoes, wearing gloves, or using shoe inserts or protective pads. Applying lotion to soften the skin. This may include using a medicated moisturizer. Gently removing dead layers of skin. Removing the corn or callus with a scalpel or laser. This is done by a health care provider who  specializes in skin or feet (dermatologist or podiatrist). Taking antibiotics, if it's infected. Having surgery for a misshapen toe. Follow these instructions at home:  Take your medicine only as told. If you were given antibiotics, take them as told. Do not stop taking them even if you start to feel better. Wear shoes that fit well. Avoid wearing shoes that have high heels or are too tight or too loose. Wear padding, protective layers, gloves, or orthotics as told. Soak your hands or feet and file the corn or callus with a file or pumice stone. Do this as told by your provider. Check your corn or callus every day for signs of infection. Contact a health care provider if: Your symptoms don't get better with treatment. You have redness or swelling that gets worse. Your corn or callus is painful. You have fluid, blood, or pus coming from your corn or callus. You have new symptoms. Get help right away if: You have very bad pain with redness. This information is not intended to replace advice given to you by your health care provider. Make sure you discuss any questions you have with your health care provider. Document Revised: 03/08/2023 Document Reviewed: 03/08/2023 Elsevier Patient Education  2024 [employer].

## 2024-04-08 DIAGNOSIS — L814 Other melanin hyperpigmentation: Secondary | ICD-10-CM | POA: Diagnosis not present

## 2024-04-08 DIAGNOSIS — D229 Melanocytic nevi, unspecified: Secondary | ICD-10-CM | POA: Diagnosis not present

## 2024-04-08 DIAGNOSIS — L821 Other seborrheic keratosis: Secondary | ICD-10-CM | POA: Diagnosis not present

## 2024-04-08 DIAGNOSIS — Z411 Encounter for cosmetic surgery: Secondary | ICD-10-CM | POA: Diagnosis not present

## 2024-04-08 DIAGNOSIS — L578 Other skin changes due to chronic exposure to nonionizing radiation: Secondary | ICD-10-CM | POA: Diagnosis not present

## 2024-04-08 DIAGNOSIS — D1801 Hemangioma of skin and subcutaneous tissue: Secondary | ICD-10-CM | POA: Diagnosis not present

## 2024-04-08 DIAGNOSIS — L281 Prurigo nodularis: Secondary | ICD-10-CM | POA: Diagnosis not present

## 2024-04-09 ENCOUNTER — Other Ambulatory Visit (HOSPITAL_COMMUNITY): Payer: Self-pay

## 2024-04-10 NOTE — Progress Notes (Signed)
 Chief Complaint  Patient presents with   Toe Pain    Pt is here due to pain in between her right great and second toe, pt has an area in between the toes that is bothering her has been there for a while but in the last few weeks has began to be very painful.     HPI: Patient is 68 y.o. female who presents today for concerns as listed above.  History confirmed with patient  She relates painful soft tissue corns between the first and second toes. The second toe is most painful. She states she has a bunion which is rubbing on the second toe.   Patient Active Problem List   Diagnosis Date Noted   Genetic testing 04/19/2015   Scalp lesion 11/09/2013   Glaucoma 10/31/2013   Abnormal Pap smear of cervix 10/31/2013   Tobacco dependence 10/31/2013   Infection of nose 10/31/2013   DERMATITIS, HANDS 07/23/2009   TOBACCO USE 03/09/2008   OTHER CIRCADIAN RHYTHM SLEEP DISORDER 03/09/2008   OSTEOPENIA 03/09/2008   GENERALIZED ANXIETY DISORDER 12/17/2007   DEPRESSION 12/17/2007   INSOMNIA 12/17/2007    Current Outpatient Medications on File Prior to Visit  Medication Sig Dispense Refill   amphetamine -dextroamphetamine  (ADDERALL) 20 MG tablet Take 1 tablet (20 mg total) by mouth 2 (two) times daily (morning and noon). 180 tablet 0   amphetamine -dextroamphetamine  (ADDERALL) 20 MG tablet Take 1 tablet (20 mg total) by mouth 2 (two) times daily. 180 tablet 0   Ca Phosphate-Cholecalciferol (CALCIUM  WITH D3 PO) Take by mouth.     cephALEXin  (KEFLEX ) 500 MG capsule Take 1 capsule (500 mg total) by mouth 2 (two) times daily until gone. 10 capsule 0   clobetasol  cream (TEMOVATE ) 0.05 % Apply 1 Application topically 2 (two) times daily. 60 g 1   cloNIDine  (CATAPRES ) 0.1 MG tablet Take 2-3 tablets (0.2-0.3 mg total) by mouth at bedtime. 90 tablet 5   gabapentin  (NEURONTIN ) 400 MG capsule Take 6 capsules (2,400 mg total) by mouth at bedtime. 720 capsule 1   hydrochlorothiazide  (HYDRODIURIL ) 12.5 MG tablet  Take 1 tablet (12.5 mg total) by mouth daily. Please schedule appt for further refills 30 tablet 6   HYDROcodone -acetaminophen  (NORCO) 10-325 MG tablet Take 1 tablet by mouth every 6 (six) hours as needed for pain. 12 tablet 0   hydrOXYzine  (ATARAX ) 25 MG tablet Take 1 tablet (25 mg total) by mouth once daily as needed. Can do 2 tablets if needed. 60 tablet 0   losartan  (COZAAR ) 100 MG tablet Take 1 tablet (100 mg total) by mouth daily. 90 tablet 1   Probiotic Product (ALIGN PO) Take by mouth.     rosuvastatin  (CRESTOR ) 5 MG tablet Take 1 tablet (5 mg total) by mouth daily. 90 tablet 1   Suvorexant  (BELSOMRA ) 20 MG TABS Take 1 tablet (20 mg total) by mouth at bedtime. 90 tablet 0   Suvorexant  (BELSOMRA ) 20 MG TABS Take 1 tablet (20 mg total) by mouth at bedtime. 90 tablet 0   Suvorexant  (BELSOMRA ) 20 MG TABS Take 1 tablet (20 mg total) by mouth at bedtime. 03/16/24 90 tablet 0   vortioxetine  HBr (TRINTELLIX ) 20 MG TABS tablet Take 1 tablet (20 mg total) by mouth daily. 30 tablet 11   No current facility-administered medications on file prior to visit.    Allergies  Allergen Reactions   Lisinopril  Cough    Mild cough after a month     Review of Systems No fevers, chills, nausea, muscle  aches, no difficulty breathing, no calf pain, no chest pain or shortness of breath.   Physical Exam  GENERAL APPEARANCE: Alert, conversant. Appropriately groomed. No acute distress.   VASCULAR: Pedal pulses palpable 2/4 DP and PT bilateral.  Capillary refill time is immediate to all digits,  Proximal to distal cooling is warm to warm.  Digital perfusion adequate.   NEUROLOGIC: sensation is intact to 5.07 monofilament at 5/5 sites bilateral.  Light touch is intact bilateral, vibratory sensation intact bilateral  MUSCULOSKELETAL: acceptable muscle strength, tone and stability bilateral. Mild bunion with adduction of hallux against the second toe is noted.  Slight contracture second toe also noted.    DERMATOLOGIC: skin is warm, supple, and dry. Hyperkeratotic lesion present latera aspect of the second toe and medial aspect of the hallux from friction and shear.  No redness, swelling or sign of infection present.     Assessment     ICD-10-CM   1. Corn or callus  L84        Plan  Discussed exam and treatment recommendations.  Recommended shaving the callus to offer her some relief. This was accomplished today with a 15 blade. Also recommended digital spacers and pads to use between the toes. Discussed surgery may need to be considered in the future if this problem is not improving with simple padding.  Also recommended lambswool between the toes.   She will follow up in the future as needed.

## 2024-04-15 ENCOUNTER — Other Ambulatory Visit: Payer: Self-pay | Admitting: Internal Medicine

## 2024-04-16 ENCOUNTER — Other Ambulatory Visit: Payer: Self-pay

## 2024-04-16 ENCOUNTER — Other Ambulatory Visit (HOSPITAL_COMMUNITY): Payer: Self-pay

## 2024-04-16 MED ORDER — ROSUVASTATIN CALCIUM 5 MG PO TABS
5.0000 mg | ORAL_TABLET | Freq: Every day | ORAL | 1 refills | Status: DC
  Filled 2024-04-16: qty 90, 90d supply, fill #0
  Filled 2024-07-18: qty 90, 90d supply, fill #1

## 2024-04-24 ENCOUNTER — Other Ambulatory Visit (HOSPITAL_COMMUNITY): Payer: Self-pay

## 2024-05-06 ENCOUNTER — Other Ambulatory Visit (HOSPITAL_COMMUNITY): Payer: Self-pay

## 2024-05-06 DIAGNOSIS — M816 Localized osteoporosis [Lequesne]: Secondary | ICD-10-CM | POA: Diagnosis not present

## 2024-05-06 DIAGNOSIS — N958 Other specified menopausal and perimenopausal disorders: Secondary | ICD-10-CM | POA: Diagnosis not present

## 2024-05-07 ENCOUNTER — Other Ambulatory Visit: Payer: Self-pay

## 2024-05-19 ENCOUNTER — Encounter: Payer: Self-pay | Admitting: Podiatry

## 2024-05-19 ENCOUNTER — Ambulatory Visit (INDEPENDENT_AMBULATORY_CARE_PROVIDER_SITE_OTHER): Admitting: Podiatry

## 2024-05-19 DIAGNOSIS — M71371 Other bursal cyst, right ankle and foot: Secondary | ICD-10-CM

## 2024-05-19 DIAGNOSIS — L84 Corns and callosities: Secondary | ICD-10-CM

## 2024-05-19 DIAGNOSIS — M2011 Hallux valgus (acquired), right foot: Secondary | ICD-10-CM | POA: Diagnosis not present

## 2024-05-19 NOTE — Progress Notes (Unsigned)
  Chief Complaint  Patient presents with   Foot Pain    Corns formed between R 1st  and 2nd toes.  Toes are painful    Injection Dr edgerton gave did help.  Toe spacers were sore to wear.    HPI: 68 y.o. female presents today after seeing Dr. Scherrie 1 month ago for kissing corns in the right first interspace.  She notes that the corns have recurred and are painful again.  She is not interested in surgical intervention at this time.  At the very end of her appointment today she did request another cortisone injection.  She stated that was the only thing that gave her relief when she saw our other provider last month.  Past Medical History:  Diagnosis Date   Allergy    Anxiety    Cataract    both eyes   Chicken pox    COPD (chronic obstructive pulmonary disease) (HCC)    Depression    Glaucoma    Hx of thyroid  disease    3rd grade  resolved    Hypertension    Ingestion of substance    3 04   Osteoporosis    Shift work sleep disorder    Past Surgical History:  Procedure Laterality Date   ADENOIDECTOMY  1959   BREAST BIOPSY Right 2019   COLONOSCOPY     Allergies  Allergen Reactions   Lisinopril  Cough    Mild cough after a month      Physical Exam: Palpable pedal pulses.  There are soft hyperkeratotic lesions on the lateral aspect of the right hallux IPJ and the medial aspect of the right second toe PIPJ.  No blister formation is noted.  No erythema or edema is appreciated.  There is significant pain on palpation to the lesions.  No ulceration is noted.  There is lateral angulation of the right hallux with some mild medial deviation of the right second toe.  Epicritic sensation is intact  Assessment/Plan of Care: 1. Corn of toe   2. Other bursal cyst, right ankle and foot   3. Hallux valgus (acquired), right foot     The hyperkeratotic lesions were shaved with a sterile #315 blade.  She was shown several different types of toe spacers and toe splints to help to keep  pressure off of the toes.  She was also encouraged to try Injinji toe socks to create a fabric buffer between the toes.  This is an attempt to try to correct the toe deformity conservatively.  With the patient's verbal consent, a corticosteroid injection was adminstered to the right 1st and 2nd toe immediately beneath the corns where the bursal cysts are most likely located, causing the significant pain.  This consisted of a mixture of 1% lidocaine plain, 0.5% sensorcaine plain, and Kenalog-10 for a total of 1.25cc's administered.  Bandaids applied. Patient tolerated this well.    Follow-up as needed  Awanda CHARM Imperial, DPM, FACFAS [facility]     [address] [place], [place] [address]                Office [phone]  Fax [phone]

## 2024-05-24 ENCOUNTER — Other Ambulatory Visit (HOSPITAL_COMMUNITY): Payer: Self-pay

## 2024-06-03 ENCOUNTER — Other Ambulatory Visit (HOSPITAL_COMMUNITY): Payer: Self-pay

## 2024-06-03 NOTE — Progress Notes (Unsigned)
   LILLETTE Ileana Collet, PhD, LAT, ATC acting as a scribe for Artist Lloyd, MD.  Jasmine Nelson is a 68 y.o. female who presents to [facility] today for osteoporosis management.   DEXA scan (date, T-score): *** Prior treatment: *** History of Hip, Spine, or Wrist Fx: *** Heart disease or stroke: *** Cancer: *** Kidney Disease: *** Gastric/Peptic Ulcer: *** Gastric bypass surgery: *** Severe GERD: *** Hx of seizures: *** Age at Menopause: *** Calcium  intake: *** Vitamin D  intake: *** Hormone replacement therapy: *** Smoking history: *** Alcohol : *** Exercise: *** Major dental work in past year: *** Parents with hip/spine fracture: *** Height loss: ***   Pertinent review of systems: ***  Relevant historical information: ***   Exam:  There were no vitals taken for this visit. General: Well Developed, well nourished, and in no acute distress.   MSK: ***    Lab and Radiology Results No results found for this or any previous visit (from the past 72 hours). No results found.     Assessment and Plan: 68 y.o. female with ***   PDMP not reviewed this encounter. No orders of the defined types were placed in this encounter.  No orders of the defined types were placed in this encounter.    Discussed warning signs or symptoms. Please see discharge instructions. Patient expresses understanding.   ***

## 2024-06-04 ENCOUNTER — Ambulatory Visit (INDEPENDENT_AMBULATORY_CARE_PROVIDER_SITE_OTHER): Admitting: Family Medicine

## 2024-06-04 ENCOUNTER — Other Ambulatory Visit (HOSPITAL_COMMUNITY): Payer: Self-pay

## 2024-06-04 ENCOUNTER — Other Ambulatory Visit: Payer: Self-pay | Admitting: Pharmacy Technician

## 2024-06-04 ENCOUNTER — Encounter: Payer: Self-pay | Admitting: Family Medicine

## 2024-06-04 ENCOUNTER — Other Ambulatory Visit: Payer: Self-pay

## 2024-06-04 ENCOUNTER — Telehealth: Payer: Self-pay

## 2024-06-04 VITALS — BP 124/78 | HR 90 | Ht 64.2 in | Wt 108.0 lb

## 2024-06-04 DIAGNOSIS — M810 Age-related osteoporosis without current pathological fracture: Secondary | ICD-10-CM

## 2024-06-04 MED ORDER — TYMLOS 3120 MCG/1.56ML ~~LOC~~ SOPN
80.0000 ug | PEN_INJECTOR | Freq: Every day | SUBCUTANEOUS | 11 refills | Status: AC
  Filled 2024-06-04: qty 1.56, fill #0
  Filled 2024-06-05: qty 1.56, 30d supply, fill #0
  Filled 2024-07-04 – 2024-07-08 (×2): qty 1.56, 30d supply, fill #1
  Filled 2024-07-31: qty 1.56, 30d supply, fill #2
  Filled 2024-08-29: qty 1.56, 30d supply, fill #3
  Filled 2024-09-23 – 2024-10-01 (×3): qty 1.56, 30d supply, fill #4

## 2024-06-04 NOTE — Telephone Encounter (Signed)
 Pharmacy Benefit  Prior Authorization for TYMLOS  APPROVED PA# [id number] Valid: 06/04/24-05/25/26

## 2024-06-04 NOTE — Telephone Encounter (Signed)
 Prior Authorization initiated for TYMLOS  via CoverMyMeds.com KEY: [id number]           Per visit note with Dr. Charlett 03/09/2008:  IN the last year saw Dr Darina for gyne . bone density slightly low . now on fosamax for this and calcium  and getting q 6 months US  and yearly labs.SABRA

## 2024-06-04 NOTE — Progress Notes (Unsigned)
 Ran test claim- zero copay for 30 day supply

## 2024-06-04 NOTE — Patient Instructions (Addendum)
 Thank you for coming in today.   We are sending in a prescription for Tymlos  to [facility]  Consider working with a Systems analyst on weight bearing exercise.   See you back in 1 year, sooner is needed.

## 2024-06-04 NOTE — Progress Notes (Signed)
 Has been on Fosamax in the past per note from PCP 03/09/2008.

## 2024-06-04 NOTE — Telephone Encounter (Signed)
 Rx sent for Tymlos  to [facility].   Pt will reach out if any issues obtaining medication.

## 2024-06-05 ENCOUNTER — Other Ambulatory Visit: Payer: Self-pay

## 2024-06-05 ENCOUNTER — Other Ambulatory Visit (HOSPITAL_COMMUNITY): Payer: Self-pay

## 2024-06-05 MED ORDER — PEN NEEDLES 31G X 5 MM MISC
1.0000 | Freq: Every day | 3 refills | Status: AC
  Filled 2024-06-05: qty 100, 90d supply, fill #0
  Filled 2024-09-02: qty 100, 90d supply, fill #1

## 2024-06-05 MED ORDER — WEBCOL ALCOHOL PREP MEDIUM 70 % PADS
1.0000 | MEDICATED_PAD | Freq: Every day | 3 refills | Status: AC
  Filled 2024-06-05: qty 100, 90d supply, fill #0
  Filled 2024-09-02: qty 100, 90d supply, fill #1

## 2024-06-05 MED ORDER — SHARPS CONTAINER MISC
1.0000 | 3 refills | Status: AC | PRN
  Filled 2024-06-05: qty 1, fill #0
  Filled 2024-07-08: qty 1, 30d supply, fill #0

## 2024-06-05 NOTE — Addendum Note (Signed)
 Addended by: MARDY LEOTIS RAMAN on: 06/05/2024 12:40 PM   Modules accepted: Orders

## 2024-06-05 NOTE — Progress Notes (Signed)
 Specialty Pharmacy Initiation Note   Jasmine Nelson is a 68 y.o. female who will be followed by the specialty pharmacy service for RxSp Osteoporosis    Review of administration, indication, effectiveness, safety, potential side effects, storage/disposable, and missed dose instructions occurred today for patient's specialty medication(s) Abaloparatide  (Tymlos )     Patient/Caregiver did not have any additional questions or concerns.   Patient's therapy is appropriate to: Initiate    Goals Addressed             This Visit's Progress    Prevent or reduce bone loss       Patient is initiating therapy. Patient will maintain adherence and adhere to provider and/or lab appointments         Delon CHRISTELLA Brow Specialty Pharmacist

## 2024-06-05 NOTE — Progress Notes (Signed)
 Specialty Pharmacy Initial Fill Coordination Note  Jasmine Nelson is a 68 y.o. female contacted today regarding initial fill of specialty medication(s) Abaloparatide  (Tymlos )   Patient requested Delivery   Delivery date: 06/10/24   Verified address: [address]  [place] [address]   Medication will be filled on 06/09/24.   Patient is aware of $0 copayment.    Shipping pen needles and alcohol  swabs  as well.

## 2024-06-06 ENCOUNTER — Other Ambulatory Visit: Payer: Self-pay

## 2024-06-09 ENCOUNTER — Ambulatory Visit (INDEPENDENT_AMBULATORY_CARE_PROVIDER_SITE_OTHER)

## 2024-06-09 ENCOUNTER — Other Ambulatory Visit: Payer: Self-pay

## 2024-06-09 VITALS — Ht 64.2 in | Wt 110.0 lb

## 2024-06-09 DIAGNOSIS — Z0000 Encounter for general adult medical examination without abnormal findings: Secondary | ICD-10-CM

## 2024-06-09 NOTE — Progress Notes (Signed)
 Subjective:   Jasmine Nelson is a 68 y.o. who presents for a Medicare Wellness preventive visit.  As a reminder, Annual Wellness Visits don't include a physical exam, and some assessments may be limited, especially if this visit is performed virtually. We may recommend an in-person follow-up visit with your provider if needed.  Visit Complete: Virtual I connected with  Jasmine Nelson on 06/09/24 by a audio enabled telemedicine application and verified that I am speaking with the correct person using two identifiers.  Patient Location: Home  Provider Location: Home Office  I discussed the limitations of evaluation and management by telemedicine. The patient expressed understanding and agreed to proceed.  Vital Signs: Because this visit was a virtual/telehealth visit, some criteria may be missing or patient reported. Any vitals not documented were not able to be obtained and vitals that have been documented are patient reported.    Persons Participating in Visit: Patient.  AWV Questionnaire: No: Patient Medicare AWV questionnaire was not completed prior to this visit.  Cardiac Risk Factors include: advanced age (>77men, >91 women);smoking/ tobacco exposure     Objective:    Today's Vitals   06/09/24 1151  Weight: 110 lb (49.9 kg)  Height: 5' 4.2 (1.631 m)   Body mass index is 18.76 kg/m.     06/09/2024   11:58 AM 06/04/2023    3:16 PM 05/22/2022    1:36 PM 05/18/2021    3:47 PM 01/03/2018    7:27 AM  Advanced Directives  Does Patient Have a Medical Advance Directive? Yes Yes Yes No No   Type of Estate agent of [place];Living will Healthcare Power of [place];Living will Healthcare Power of [place];Living will    Copy of Healthcare Power of Attorney in Chart? No - copy requested No - copy requested No - copy requested    Would patient like information on creating a medical advance directive?    No - Patient declined No - Patient declined      Data saved  with a previous flowsheet row definition    Current Medications (verified) Outpatient Encounter Medications as of 06/09/2024  Medication Sig   Abaloparatide  (TYMLOS ) 3120 MCG/1.56ML SOPN Inject 80 mcg into the skin daily.   Alcohol  Swabs  (WEBCOL ALCOHOL  PREP MEDIUM) 70 % PADS Use daily for Tymlos  injection   amphetamine -dextroamphetamine  (ADDERALL) 20 MG tablet Take 1 tablet (20 mg total) by mouth 2 (two) times daily.   Ca Phosphate-Cholecalciferol (CALCIUM  WITH D3 PO) Take by mouth.   cephALEXin  (KEFLEX ) 500 MG capsule Take 1 capsule (500 mg total) by mouth 2 (two) times daily until gone.   clobetasol  cream (TEMOVATE ) 0.05 % Apply 1 Application topically 2 (two) times daily.   cloNIDine  (CATAPRES ) 0.1 MG tablet Take 2-3 tablets (0.2-0.3 mg total) by mouth at bedtime.   gabapentin  (NEURONTIN ) 400 MG capsule Take 6 capsules (2,400 mg total) by mouth at bedtime.   hydrochlorothiazide  (HYDRODIURIL ) 12.5 MG tablet Take 1 tablet (12.5 mg total) by mouth daily. Please schedule appt for further refills   Insulin  Pen Needle (PEN NEEDLES) 31G X 5 MM MISC Use with Tymlos  daily   losartan  (COZAAR ) 100 MG tablet Take 1 tablet (100 mg total) by mouth daily.   Probiotic Product (ALIGN PO) Take by mouth.   rosuvastatin  (CRESTOR ) 5 MG tablet Take 1 tablet (5 mg total) by mouth daily.   sharps container Use as needed   Suvorexant  (BELSOMRA ) 20 MG TABS Take 1 tablet (20 mg total) by mouth at  bedtime. 03/16/24   vortioxetine  HBr (TRINTELLIX ) 20 MG TABS tablet Take 1 tablet (20 mg total) by mouth daily.   No facility-administered encounter medications on file as of 06/09/2024.    Allergies (verified) Lisinopril    History: Past Medical History:  Diagnosis Date   Allergy    Anxiety    Cataract    both eyes   Chicken pox    COPD (chronic obstructive pulmonary disease) (HCC)    Depression    Glaucoma    Hx of thyroid  disease    3rd grade  resolved    Hypertension    Ingestion of substance    3 04    Osteoporosis    Shift work sleep disorder    Past Surgical History:  Procedure Laterality Date   ADENOIDECTOMY  1959   BREAST BIOPSY Right 2019   COLONOSCOPY     Family History  Problem Relation Age of Onset   Cancer Mother 24       Died at 19 of Non-Hodgkins Lymphoma   Lymphoma Mother        35   Colon polyps Mother        many; required regular colonoscopies   Alcohol  abuse Father    COPD Father    Depression Father    Hyperlipidemia Father    Hypertension Father        deceased   Colon polyps Father        many; required regular colonoscopies   Colon cancer Sister        dx. 65-56   Other Sister        +CHEK2 mutation   COPD Maternal Aunt    Ovarian cancer Maternal Aunt 62   Stroke Maternal Grandmother    Heart Problems Maternal Grandmother    Cancer Maternal Grandfather 68       Brain Cancer   Arthritis Paternal Grandmother    Depression Paternal Grandmother    Glaucoma Paternal Grandmother    Breast cancer Paternal Grandmother        dx. 50s   Uterine cancer Paternal Grandmother        dx. 60s   Renal cancer Paternal Grandfather        smoker   Multiple myeloma Maternal Uncle        dx. well before his 79s   Ovarian cancer Other    Esophageal cancer Neg Hx    Stomach cancer Neg Hx    Rectal cancer Neg Hx    Social History   Socioeconomic History   Marital status: Married    Spouse name: Not on file   Number of children: Not on file   Years of education: Not on file   Highest education level: Not on file  Occupational History   Not on file  Tobacco Use   Smoking status: Every Day    Current packs/day: 0.50    Types: Cigarettes   Smokeless tobacco: Never  Vaping Use   Vaping status: Some Days  Substance and Sexual Activity   Alcohol  use: Yes    Comment: occasional wine   Drug use: No   Sexual activity: Not on file  Other Topics Concern   Not on file  Social History Narrative   Averages 5-8 hours of sleep per day.   Works third  shift   2 people living in the home   3 dogs and 1 cat in the home   Radiology tech bs degree  [employer]   Married  1ppd   Stopped etoh   Seatbelts fa stored safely.   P0G0      Social Drivers of Corporate investment banker Strain: Low Risk  (06/09/2024)   Overall Financial Resource Strain (CARDIA)    Difficulty of Paying Living Expenses: Not hard at all  Food Insecurity: No Food Insecurity (06/09/2024)   Hunger Vital Sign    Worried About Running Out of Food in the Last Year: Never true    Ran Out of Food in the Last Year: Never true  Transportation Needs: No Transportation Needs (06/09/2024)   PRAPARE - Administrator, Civil Service (Medical): No    Lack of Transportation (Non-Medical): No  Physical Activity: Inactive (06/09/2024)   Exercise Vital Sign    Days of Exercise per Week: 0 days    Minutes of Exercise per Session: 0 min  Stress: No Stress Concern Present (06/09/2024)   [employer] of Occupational Health - Occupational Stress Questionnaire    Feeling of Stress: Not at all  Social Connections: Moderately Isolated (06/09/2024)   Social Connection and Isolation Panel    Frequency of Communication with Friends and Family: More than three times a week    Frequency of Social Gatherings with Friends and Family: More than three times a week    Attends Religious Services: Never    Database administrator or Organizations: No    Attends Engineer, structural: Never    Marital Status: Married    Tobacco Counseling Ready to quit: Yes Counseling given: Yes    Clinical Intake:  Pre-visit preparation completed: Yes  Pain : No/denies pain     BMI - recorded: 18.76 Nutritional Status: BMI <19  Underweight Nutritional Risks: None Diabetes: No  Lab Results  Component Value Date   HGBA1C 6.2 10/11/2023   HGBA1C 6.0 07/19/2022   HGBA1C 5.7 05/10/2021     How often do you need to have someone help you when you read instructions, pamphlets,  or other written materials from your doctor or pharmacy?: 1 - Never  Interpreter Needed?: No  Information entered by :: Rojelio Blush LPN   Activities of Daily Living      06/09/2024   11:57 AM  In your present state of health, do you have any difficulty performing the following activities:  Hearing? 0  Vision? 0  Difficulty concentrating or making decisions? 0  Walking or climbing stairs? 0  Dressing or bathing? 0  Doing errands, shopping? 0  Preparing Food and eating ? N  Using the Toilet? N  In the past six months, have you accidently leaked urine? N  Do you have problems with loss of bowel control? N  Managing your Medications? N  Managing your Finances? N  Housekeeping or managing your Housekeeping? N    Patient Care Team: Panosh, Wanda K, MD as PCP - [facility]   I have updated your Care Teams any recent Medical Services you may have received from other providers in the past year.     Assessment:   This is a routine wellness examination for [facility].  Hearing/Vision screen Hearing Screening - Comments:: Denies hearing difficulties   Vision Screening - Comments:: Wears rx glasses - up to date with routine eye exams with  Dr Octavia   Goals Addressed               This Visit's Progress     Increase physical activity (pt-stated)  Gt more active.       Depression Screen      06/09/2024   11:55 AM 10/11/2023   11:04 AM 06/04/2023    3:15 PM 05/22/2022    1:37 PM 05/18/2021    3:41 PM 11/08/2018    8:41 AM  PHQ 2/9 Scores  PHQ - 2 Score 0 0 0 0 0 0    Fall Risk      06/09/2024   11:57 AM 06/04/2023    3:15 PM 05/22/2022    1:37 PM 05/18/2022   11:04 AM 05/18/2021    3:48 PM  Fall Risk   Falls in the past year? 0 0 0 0 0  Number falls in past yr: 0 0 0 0 0  Injury with Fall? 0 0 0 0 0  Risk for fall due to : No Fall Risks No Fall Risks Medication side effect  No Fall Risks  Follow up Falls evaluation completed Falls  prevention discussed Falls evaluation completed;Education provided;Falls prevention discussed   Falls evaluation completed      Data saved with a previous flowsheet row definition    MEDICARE RISK AT HOME:   Medicare Risk at Home Any stairs in or around the home?: No If so, are there any without handrails?: No Home free of loose throw rugs in walkways, pet beds, electrical cords, etc?: Yes Adequate lighting in your home to reduce risk of falls?: Yes Life alert?: No Use of a cane, walker or w/c?: No Grab bars in the bathroom?: No Shower chair or bench in shower?: Yes Elevated toilet seat or a handicapped toilet?: Yes  TIMED UP AND GO:  Was the test performed?  No  Cognitive Function: 6CIT completed        06/09/2024   11:58 AM 06/04/2023    3:17 PM 05/22/2022    1:38 PM  6CIT Screen  What Year? 0 points 0 points 0 points  What month? 0 points 0 points 0 points  What time? 0 points 0 points 0 points  Count back from 20 0 points 0 points 0 points  Months in reverse 0 points 0 points 0 points  Repeat phrase 0 points 0 points 0 points  Total Score 0 points 0 points 0 points    Immunizations Immunization History  Administered Date(s) Administered    sv, Bivalent, Protein Subunit Rsvpref,pf [place]) 08/05/2022   INFLUENZA, HIGH DOSE SEASONAL PF 06/26/2022   Influenza Whole 07/09/2009   Influenza, Quadrivalent, Recombinant, Inj, Pf 07/12/2021, 07/07/2023   Moderna SARS-COV2 Booster Vaccination 08/09/2020   Moderna Sars-Covid-2 Vaccination 12/18/2019, 01/14/2020   PFIZER(Purple Top)SARS-COV-2 Vaccination 03/25/2021, 07/06/2022   PNEUMOCOCCAL CONJUGATE-20 05/30/2023   Pfizer(Comirnaty)Fall Seasonal Vaccine 12 years and older 07/22/2023   Pneumococcal Conjugate-13 10/31/2013   Pneumococcal Polysaccharide-23 01/22/2015, 05/18/2021   Td 11/08/2018   Zoster Recombinant(Shingrix ) 11/08/2018, 06/20/2019    Screening Tests Health Maintenance  Topic Date Due   FOOT EXAM  Never  done   OPHTHALMOLOGY EXAM  Never done   Diabetic kidney evaluation - Urine ACR  Never done   Mammogram  10/08/2022   HEMOGLOBIN A1C  04/09/2024   Influenza Vaccine  04/25/2024   COVID-19 Vaccine (6 - Mixed Product risk 2024-25 season) 05/26/2024   Diabetic kidney evaluation - eGFR measurement  02/05/2025   Medicare Annual Wellness (AWV)  06/09/2025   Colonoscopy  01/26/2028   DTaP/Tdap/Td (2 - Tdap) 11/08/2028   Pneumococcal Vaccine: 50+ Years  Completed   DEXA SCAN  Completed   Hepatitis  C Screening  Completed   Zoster Vaccines- Shingrix   Completed   HPV VACCINES  Aged Out   Meningococcal B Vaccine  Aged Out    Health Maintenance Items Addressed:   Additional Screening:  Vision Screening: Recommended annual ophthalmology exams for early detection of glaucoma and other disorders of the eye. Is the patient up to date with their annual eye exam?  Yes  Who is the provider or what is the name of the office in which the patient attends annual eye exams? Dr Octavia  Dental Screening: Recommended annual dental exams for proper oral hygiene  Community Resource Referral / Chronic Care Management: CRR required this visit?  No   CCM required this visit?  No   Plan:    I have personally reviewed and noted the following in the patient's chart:   Medical and social history Use of alcohol , tobacco or illicit drugs  Current medications and supplements including opioid prescriptions. Patient is not currently taking opioid prescriptions. Functional ability and status Nutritional status Physical activity Advanced directives List of other physicians Hospitalizations, surgeries, and ER visits in previous 12 months Vitals Screenings to include cognitive, depression, and falls Referrals and appointments  In addition, I have reviewed and discussed with patient certain preventive protocols, quality metrics, and best practice recommendations. A written personalized care plan for preventive  services as well as general preventive health recommendations were provided to patient.   Rojelio LELON Blush, LPN   [id number]   After Visit Summary: (MyChart) Due to this being a telephonic visit, the after visit summary with patients personalized plan was offered to patient via MyChart   Notes: Nothing significant to report at this time.

## 2024-06-09 NOTE — Patient Instructions (Addendum)
 Jasmine Nelson,  Thank you for taking the time for your Medicare Wellness Visit. I appreciate your continued commitment to your health goals. Please review the care plan we discussed, and feel free to reach out if I can assist you further.  Medicare recommends these wellness visits once per year to help you and your care team stay ahead of potential health issues. These visits are designed to focus on prevention, allowing your provider to concentrate on managing your acute and chronic conditions during your regular appointments.  Please note that Annual Wellness Visits do not include a physical exam. Some assessments may be limited, especially if the visit was conducted virtually. If needed, we may recommend a separate in-person follow-up with your provider.  Ongoing Care Seeing your primary care provider every 3 to 6 months helps us  monitor your health and provide consistent, personalized care.   Referrals If a referral was made during today's visit and you haven't received any updates within two weeks, please contact the referred provider directly to check on the status.  Recommended Screenings:  Health Maintenance  Topic Date Due   Complete foot exam   Never done   Eye exam for diabetics  Never done   Yearly kidney health urinalysis for diabetes  Never done   Breast Cancer Screening  10/08/2022   Hemoglobin A1C  04/09/2024   Flu Shot  04/25/2024   COVID-19 Vaccine (6 - Mixed Product risk 2024-25 season) 05/26/2024   Yearly kidney function blood test for diabetes  02/05/2025   Medicare Annual Wellness Visit  06/09/2025   Colon Cancer Screening  01/26/2028   DTaP/Tdap/Td vaccine (2 - Tdap) 11/08/2028   Pneumococcal Vaccine for age over 63  Completed   DEXA scan (bone density measurement)  Completed   Hepatitis C Screening  Completed   Zoster (Shingles) Vaccine  Completed   HPV Vaccine  Aged Out   Meningitis B Vaccine  Aged Out       06/09/2024   11:58 AM  Advanced Directives  Does  Patient Have a Medical Advance Directive? Yes  Type of Estate agent of [place];Living will  Copy of Healthcare Power of Attorney in Chart? No - copy requested   Advance Care Planning is important because it: Ensures you receive medical care that aligns with your values, goals, and preferences. Provides guidance to your family and loved ones, reducing the emotional burden of decision-making during critical moments.  Vision: Annual vision screenings are recommended for early detection of glaucoma, cataracts, and diabetic retinopathy. These exams can also reveal signs of chronic conditions such as diabetes and high blood pressure.  Dental: Annual dental screenings help detect early signs of oral cancer, gum disease, and other conditions linked to overall health, including heart disease and diabetes.  Please see the attached documents for additional preventive care recommendations.

## 2024-06-10 ENCOUNTER — Other Ambulatory Visit (HOSPITAL_COMMUNITY): Payer: Self-pay

## 2024-06-10 MED ORDER — AMPHETAMINE-DEXTROAMPHETAMINE 20 MG PO TABS
20.0000 mg | ORAL_TABLET | Freq: Two times a day (BID) | ORAL | 0 refills | Status: DC
  Filled 2024-06-10: qty 180, 90d supply, fill #0

## 2024-06-11 ENCOUNTER — Other Ambulatory Visit (HOSPITAL_COMMUNITY): Payer: Self-pay

## 2024-06-13 NOTE — Telephone Encounter (Signed)
 Would like to make sure pt has received and started on Tymlos  and tolerating well.   Called pt, left VM to call the office

## 2024-06-13 NOTE — Telephone Encounter (Signed)
 Pt returned call. Has not started Tymlos  yet as she is having some GI issues and did not want to make it worse.  Hope to start this weekend and will contact us  if problems or concers.

## 2024-06-23 ENCOUNTER — Other Ambulatory Visit (HOSPITAL_COMMUNITY): Payer: Self-pay

## 2024-06-24 ENCOUNTER — Other Ambulatory Visit (HOSPITAL_COMMUNITY): Payer: Self-pay

## 2024-06-24 MED ORDER — BELSOMRA 20 MG PO TABS
20.0000 mg | ORAL_TABLET | Freq: Every day | ORAL | 0 refills | Status: AC
  Filled 2024-06-24: qty 90, 90d supply, fill #0

## 2024-06-25 ENCOUNTER — Other Ambulatory Visit (HOSPITAL_COMMUNITY): Payer: Self-pay

## 2024-06-25 ENCOUNTER — Other Ambulatory Visit: Payer: Self-pay

## 2024-07-01 ENCOUNTER — Other Ambulatory Visit: Payer: Self-pay

## 2024-07-01 ENCOUNTER — Other Ambulatory Visit: Payer: Self-pay | Admitting: Internal Medicine

## 2024-07-01 ENCOUNTER — Other Ambulatory Visit (HOSPITAL_COMMUNITY): Payer: Self-pay

## 2024-07-01 MED ORDER — TRINTELLIX 20 MG PO TABS
20.0000 mg | ORAL_TABLET | Freq: Every day | ORAL | 0 refills | Status: DC
  Filled 2024-07-01: qty 90, 90d supply, fill #0

## 2024-07-02 ENCOUNTER — Other Ambulatory Visit (HOSPITAL_COMMUNITY): Payer: Self-pay

## 2024-07-02 MED ORDER — HYDROCHLOROTHIAZIDE 12.5 MG PO TABS
12.5000 mg | ORAL_TABLET | Freq: Every day | ORAL | 6 refills | Status: AC
  Filled 2024-07-02: qty 30, 30d supply, fill #0
  Filled 2024-07-31: qty 30, 30d supply, fill #1
  Filled 2024-08-27: qty 30, 30d supply, fill #2
  Filled 2024-10-09: qty 30, 30d supply, fill #3

## 2024-07-04 ENCOUNTER — Other Ambulatory Visit: Payer: Self-pay

## 2024-07-07 ENCOUNTER — Other Ambulatory Visit: Payer: Self-pay

## 2024-07-07 ENCOUNTER — Other Ambulatory Visit (HOSPITAL_COMMUNITY): Payer: Self-pay

## 2024-07-08 ENCOUNTER — Other Ambulatory Visit (HOSPITAL_COMMUNITY): Payer: Self-pay

## 2024-07-08 ENCOUNTER — Other Ambulatory Visit: Payer: Self-pay

## 2024-07-08 NOTE — Progress Notes (Signed)
 Specialty Pharmacy Refill Coordination Note  Jasmine Nelson is a 68 y.o. female contacted today regarding refills of specialty medication(s) Abaloparatide  (Tymlos )   Patient requested Delivery   Delivery date: 07/10/24   Verified address: [address]  [place] [address]   Medication will be filled on 07/09/24.

## 2024-07-12 DIAGNOSIS — Z23 Encounter for immunization: Secondary | ICD-10-CM | POA: Diagnosis not present

## 2024-07-18 ENCOUNTER — Other Ambulatory Visit (HOSPITAL_COMMUNITY): Payer: Self-pay

## 2024-07-22 NOTE — Telephone Encounter (Signed)
 Called pt, left VM to call the office or view message via MyChart.

## 2024-07-23 ENCOUNTER — Other Ambulatory Visit: Payer: Self-pay

## 2024-07-28 NOTE — Telephone Encounter (Signed)
 Patient called back. She said that her GI symptoms have resolved. She had to go out of town last week so she has not started the Tymlos  yet, but she is planning to do it this week. She will contact us  if she has any issues.

## 2024-07-31 ENCOUNTER — Other Ambulatory Visit: Payer: Self-pay

## 2024-08-05 ENCOUNTER — Other Ambulatory Visit: Payer: Self-pay

## 2024-08-05 NOTE — Progress Notes (Signed)
 Specialty Pharmacy Refill Coordination Note  Jasmine Nelson is a 68 y.o. female contacted today regarding refills of specialty medication(s) Abaloparatide  (Tymlos )   Patient requested Delivery   Delivery date: 08/07/24   Verified address: [address]  [place] [address]   Medication will be filled on: 08/06/24

## 2024-08-08 ENCOUNTER — Telehealth: Admitting: Family Medicine

## 2024-08-08 ENCOUNTER — Ambulatory Visit: Payer: Self-pay

## 2024-08-08 ENCOUNTER — Other Ambulatory Visit: Payer: Self-pay

## 2024-08-08 VITALS — Ht 64.2 in

## 2024-08-08 DIAGNOSIS — R0981 Nasal congestion: Secondary | ICD-10-CM | POA: Diagnosis not present

## 2024-08-08 DIAGNOSIS — J0100 Acute maxillary sinusitis, unspecified: Secondary | ICD-10-CM | POA: Diagnosis not present

## 2024-08-08 MED ORDER — AMOXICILLIN-POT CLAVULANATE 875-125 MG PO TABS
1.0000 | ORAL_TABLET | Freq: Two times a day (BID) | ORAL | 0 refills | Status: AC
  Filled 2024-08-08: qty 14, 7d supply, fill #0

## 2024-08-08 NOTE — Progress Notes (Signed)
 Virtual Visit via Video Note I connected with Jasmine Nelson on 08/08/24 by a video enabled telemedicine application and verified that I am speaking with the correct person using two identifiers. Location patient: home Location provider:work office Persons participating in the virtual visit: patient, provider  I discussed the limitations of evaluation and management by telemedicine and the availability of in person appointments. The patient expressed understanding and agreed to proceed.  Chief Complaint  Patient presents with   Sinus Problem   Discussed the use of AI scribe software for clinical note transcription with the patient, who gave verbal consent to proceed.  History of Present Illness Jasmine Nelson is a 68 year old female with PMHx significant for tobacco use disorder, COPD, and hypertension who presents with sinus congestion and green discharge for about a week.  She has been experiencing sinus congestion/pain and green nasal discharge for a little over a week. The symptoms began with a frontal headache, sore throat, and general malaise, followed by congestion and green discharge from her sinuses. The symptoms have not improved and have worsened recently, with a recurrence of severe symptoms last night.  She feels like the rhinorrhea originates from deep within her maxillary sinuses, accompanied by maxillary pain/facial sensitivity.  She has a cough and experienced chills a few days ago, which have since resolved. No epistaxis is present. Negative for fever, sore throat, wheezing, dyspnea, abdominal pain, nausea, vomiting, changes in bowel habits, or skin rash. No sick contact or recent travel.  A home COVID test was negative.   She mentions a history of sinus infections with similar symptoms in the past.  She has been using Mucinex and Flonase with minimal relief. Typically, she takes vitamin C and zinc when ill, which usually resolves her symptoms within a couple of days, but  this episode has persisted.  She continues to smoke, though not heavily.  ROS: See pertinent positives and negatives per HPI.  Past Medical History:  Diagnosis Date   Allergy    Anxiety    Cataract    both eyes   Chicken pox    COPD (chronic obstructive pulmonary disease) (HCC)    Depression    Glaucoma    Hx of thyroid  disease    3rd grade  resolved    Hypertension    Ingestion of substance    3 04   Osteoporosis    Shift work sleep disorder    Past Surgical History:  Procedure Laterality Date   ADENOIDECTOMY  1959   BREAST BIOPSY Right 2019   COLONOSCOPY     Family History  Problem Relation Age of Onset   Cancer Mother 42       Died at 86 of Non-Hodgkins Lymphoma   Lymphoma Mother        40   Colon polyps Mother        many; required regular colonoscopies   Alcohol  abuse Father    COPD Father    Depression Father    Hyperlipidemia Father    Hypertension Father        deceased   Colon polyps Father        many; required regular colonoscopies   Colon cancer Sister        dx. 76-56   Other Sister        +CHEK2 mutation   COPD Maternal Aunt    Ovarian cancer Maternal Aunt 62   Stroke Maternal Grandmother    Heart Problems Maternal Grandmother  Cancer Maternal Grandfather 80       Brain Cancer   Arthritis Paternal Grandmother    Depression Paternal Grandmother    Glaucoma Paternal Grandmother    Breast cancer Paternal Grandmother        dx. 50s   Uterine cancer Paternal Grandmother        dx. 60s   Renal cancer Paternal Grandfather        smoker   Multiple myeloma Maternal Uncle        dx. well before his 45s   Ovarian cancer Other    Esophageal cancer Neg Hx    Stomach cancer Neg Hx    Rectal cancer Neg Hx     Social History   Socioeconomic History   Marital status: Married    Spouse name: Not on file   Number of children: Not on file   Years of education: Not on file   Highest education level: Bachelor's degree (e.g., BA, AB, BS)   Occupational History   Not on file  Tobacco Use   Smoking status: Every Day    Current packs/day: 0.50    Types: Cigarettes   Smokeless tobacco: Never  Vaping Use   Vaping status: Some Days  Substance and Sexual Activity   Alcohol  use: Yes    Comment: occasional wine   Drug use: No   Sexual activity: Not on file  Other Topics Concern   Not on file  Social History Narrative   Averages 5-8 hours of sleep per day.   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   P0G0      Social Drivers of Corporate Investment Banker Strain: Low Risk  (08/08/2024)   Overall Financial Resource Strain (CARDIA)    Difficulty of Paying Living Expenses: Not hard at all  Food Insecurity: No Food Insecurity (08/08/2024)   Hunger Vital Sign    Worried About Running Out of Food in the Last Year: Never true    Ran Out of Food in the Last Year: Never true  Transportation Needs: No Transportation Needs (08/08/2024)   PRAPARE - Administrator, Civil Service (Medical): No    Lack of Transportation (Non-Medical): No  Physical Activity: Inactive (08/08/2024)   Exercise Vital Sign    Days of Exercise per Week: 0 days    Minutes of Exercise per Session: Not on file  Stress: Stress Concern Present (08/08/2024)   [employer] of Occupational Health - Occupational Stress Questionnaire    Feeling of Stress: To some extent  Social Connections: Socially Isolated (08/08/2024)   Social Connection and Isolation Panel    Frequency of Communication with Friends and Family: Once a week    Frequency of Social Gatherings with Friends and Family: Never    Attends Religious Services: Never    Database Administrator or Organizations: No    Attends Engineer, Structural: Not on file    Marital Status: Married  Catering Manager Violence: Not At Risk  (06/09/2024)   Humiliation, Afraid, Rape, and Kick questionnaire    Fear of Current or Ex-Partner: No    Emotionally Abused: No    Physically Abused: No    Sexually Abused: No    Current Outpatient Medications:  amoxicillin -clavulanate (AUGMENTIN) 875-125 MG tablet, Take 1 tablet by mouth 2 (two) times daily for 7 days., Disp: 14 tablet, Rfl: 0   Abaloparatide  (TYMLOS ) 3120 MCG/1.56ML SOPN, Inject 80 mcg into the skin daily., Disp: 1.56 mL, Rfl: 11   Alcohol  Swabs  (WEBCOL ALCOHOL  PREP MEDIUM) 70 % PADS, Use daily for Tymlos  injection, Disp: 100 each, Rfl: 3   amphetamine -dextroamphetamine  (ADDERALL) 20 MG tablet, Take 1 tablet (20 mg total) by mouth 2 (two) times daily., Disp: 180 tablet, Rfl: 0   Ca Phosphate-Cholecalciferol (CALCIUM  WITH D3 PO), Take by mouth., Disp: , Rfl:    clobetasol  cream (TEMOVATE ) 0.05 %, Apply 1 Application topically 2 (two) times daily., Disp: 60 g, Rfl: 1   cloNIDine  (CATAPRES ) 0.1 MG tablet, Take 2-3 tablets (0.2-0.3 mg total) by mouth at bedtime., Disp: 90 tablet, Rfl: 5   gabapentin  (NEURONTIN ) 400 MG capsule, Take 6 capsules (2,400 mg total) by mouth at bedtime., Disp: 720 capsule, Rfl: 1   hydrochlorothiazide  (HYDRODIURIL ) 12.5 MG tablet, Take 1 tablet (12.5 mg total) by mouth daily. Please schedule appt for further refills, Disp: 30 tablet, Rfl: 6   Insulin  Pen Needle (PEN NEEDLES) 31G X 5 MM MISC, Use with Tymlos  daily, Disp: 100 each, Rfl: 3   losartan  (COZAAR ) 100 MG tablet, Take 1 tablet (100 mg total) by mouth daily., Disp: 90 tablet, Rfl: 1   Probiotic Product (ALIGN PO), Take by mouth., Disp: , Rfl:    rosuvastatin  (CRESTOR ) 5 MG tablet, Take 1 tablet (5 mg total) by mouth daily., Disp: 90 tablet, Rfl: 1   sharps container, Use as needed, Disp: 1 each, Rfl: 3   Suvorexant  (BELSOMRA ) 20 MG TABS, Take 1 tablet (20 mg total) by mouth at bedtime. 03/16/24, Disp: 90 tablet, Rfl: 0   Suvorexant  (BELSOMRA ) 20 MG TABS, Take 1 tablet (20 mg total) by mouth at  bedtime., Disp: 90 tablet, Rfl: 0   vortioxetine  HBr (TRINTELLIX ) 20 MG TABS tablet, Take 1 tablet (20 mg total) by mouth daily., Disp: 30 tablet, Rfl: 11   vortioxetine  HBr (TRINTELLIX ) 20 MG TABS tablet, Take 1 tablet (20 mg total) by mouth daily., Disp: 90 tablet, Rfl: 0  EXAM:  VITALS per patient if applicable:Ht 5' 4.2 (1.631 m)   BMI 18.76 kg/m   GENERAL: alert, oriented, appears well and in no acute distress  HEENT: atraumatic, conjunctiva clear, no obvious abnormalities on inspection of external nose and ears  NECK: normal movements of the head and neck  LUNGS: on inspection no signs of respiratory distress, breathing rate appears normal, no obvious gross SOB, gasping or wheezing  CV: no obvious cyanosis  MS: moves all visible extremities without noticeable abnormality  PSYCH/NEURO: pleasant and cooperative, no obvious depression or anxiety, speech and thought processing grossly intact  ASSESSMENT AND PLAN:  Discussed the following assessment and plan:  Nasal sinus congestion Explained that nasal congestion can last a few days and even weeks after an acute URI and given the history of tobacco use, he may be longer. Continue Flonase nasal spray, recommend using at bedtime for 10 to 14 days and then as needed. Nasal saline irrigations during the day as needed.  Acute non-recurrent maxillary sinusitis Explained that he symptoms she reported today could be postviral and allergy related. We discussed some side effects of antibiotic use. Health maintenance he had a 75-125 mg twice daily for 7 days recommended. Follow-up with PCP if symptoms are persistent, in which case may need sinus CT. Smoking cessation encouraged.  -  Amoxicillin -Pot Clavulanate; Take 1 tablet by mouth 2 (two) times daily for 7 days.  Dispense: 14 tablet; Refill: 0  We discussed possible serious and likely etiologies, options for evaluation and workup, limitations of telemedicine visit vs in person  visit, treatment, treatment risks and precautions. The patient was advised to call back or seek an in-person evaluation if the symptoms worsen or if the condition fails to improve as anticipated. I discussed the assessment and treatment plan with the patient. The patient was provided an opportunity to ask questions and all were answered. The patient agreed with the plan and demonstrated an understanding of the instructions.  Return if symptoms worsen or fail to improve.  Pachia Strum, MD

## 2024-08-08 NOTE — Telephone Encounter (Signed)
 Noted

## 2024-08-08 NOTE — Telephone Encounter (Signed)
 FYI Only or Action Required?: FYI only for provider: appointment scheduled on today.  Patient was last seen in primary care on 02/06/2024 by Panosh, Apolinar POUR, MD.  Called Nurse Triage reporting Sinusitis.  Symptoms began several days ago.  Symptoms are: unchanged.  Triage Disposition: See PCP When Office is Open (Within 3 Days)  Patient/caregiver understands and will follow disposition?: Yes      Copied from CRM #[id number]. Topic: Clinical - Red Word Triage >> Aug 08, 2024 12:50 PM Drema MATSU wrote: [facility] that prompted transfer to Nurse Triage:  Patient has been sick for about a week sinus infection bad headache, sore throat, green mucous and congested. Patient would like a virtual appt.       Reason for Disposition  [1] Nasal discharge AND [2] present > 10 days  Answer Assessment - Initial Assessment Questions Patient would like a text with instructions for her video appointment, stating she is unable to access MyChart.      1. LOCATION: Where does it hurt?      Frontal  2. ONSET: When did the sinus pain start?  (e.g., hours, days)      About a week ago  3. SEVERITY: How bad is the pain?   (Scale 0-10; or none, mild, moderate or severe)     Mild to moderate  4. RECURRENT SYMPTOM: Have you ever had sinus problems before? If Yes, ask: When was the last time? and What happened that time?      Yes, previous sinus infections  5. NASAL CONGESTION: Is the nose blocked? If Yes, ask: Can you open it or must you breathe through your mouth?     Yes 6. NASAL DISCHARGE: Do you have discharge from your nose? If so ask, What color?     Green 7. FEVER: Do you have a fever? If Yes, ask: What is it, how was it measured, and when did it start?      Unsure, has chills  8. OTHER SYMPTOMS: Do you have any other symptoms? (e.g., sore throat, cough, earache, difficulty breathing)     Headache  Protocols used: Sinus Pain or Congestion-A-AH

## 2024-08-10 ENCOUNTER — Other Ambulatory Visit (HOSPITAL_COMMUNITY): Payer: Self-pay

## 2024-08-20 ENCOUNTER — Other Ambulatory Visit (HOSPITAL_COMMUNITY): Payer: Self-pay

## 2024-08-26 ENCOUNTER — Other Ambulatory Visit (HOSPITAL_COMMUNITY): Payer: Self-pay

## 2024-08-26 ENCOUNTER — Other Ambulatory Visit: Payer: Self-pay

## 2024-08-26 DIAGNOSIS — H35371 Puckering of macula, right eye: Secondary | ICD-10-CM | POA: Diagnosis not present

## 2024-08-26 DIAGNOSIS — Z961 Presence of intraocular lens: Secondary | ICD-10-CM | POA: Diagnosis not present

## 2024-08-26 DIAGNOSIS — H31091 Other chorioretinal scars, right eye: Secondary | ICD-10-CM | POA: Diagnosis not present

## 2024-08-26 MED ORDER — GUANFACINE HCL ER 1 MG PO TB24
1.0000 mg | ORAL_TABLET | Freq: Every morning | ORAL | 0 refills | Status: AC
  Filled 2024-08-26: qty 90, 90d supply, fill #0

## 2024-08-27 ENCOUNTER — Other Ambulatory Visit: Payer: Self-pay

## 2024-08-28 ENCOUNTER — Ambulatory Visit (HOSPITAL_BASED_OUTPATIENT_CLINIC_OR_DEPARTMENT_OTHER)
Admission: RE | Admit: 2024-08-28 | Discharge: 2024-08-28 | Disposition: A | Source: Ambulatory Visit | Attending: Acute Care | Admitting: Acute Care

## 2024-08-28 ENCOUNTER — Other Ambulatory Visit: Payer: Self-pay

## 2024-08-28 DIAGNOSIS — Z122 Encounter for screening for malignant neoplasm of respiratory organs: Secondary | ICD-10-CM | POA: Diagnosis present

## 2024-08-28 DIAGNOSIS — Z87891 Personal history of nicotine dependence: Secondary | ICD-10-CM | POA: Diagnosis not present

## 2024-08-28 DIAGNOSIS — F17210 Nicotine dependence, cigarettes, uncomplicated: Secondary | ICD-10-CM | POA: Diagnosis not present

## 2024-08-29 ENCOUNTER — Other Ambulatory Visit: Payer: Self-pay

## 2024-08-29 ENCOUNTER — Other Ambulatory Visit (HOSPITAL_COMMUNITY): Payer: Self-pay

## 2024-08-29 MED ORDER — CYCLOSPORINE (PF) 0.09 % OP SOLN
1.0000 [drp] | Freq: Two times a day (BID) | OPHTHALMIC | 1 refills | Status: AC
  Filled 2024-08-29: qty 360, 90d supply, fill #0

## 2024-08-29 NOTE — Progress Notes (Signed)
 Specialty Pharmacy Refill Coordination Note  Jasmine Nelson is a 68 y.o. female contacted today regarding refills of specialty medication(s) Abaloparatide  (Tymlos )   Patient requested Delivery   Delivery date: 09/03/24   Verified address: [address]  [place] [address]   Medication will be filled on: 09/02/24

## 2024-08-30 ENCOUNTER — Other Ambulatory Visit (HOSPITAL_COMMUNITY): Payer: Self-pay

## 2024-09-01 ENCOUNTER — Other Ambulatory Visit (HOSPITAL_COMMUNITY): Payer: Self-pay

## 2024-09-01 ENCOUNTER — Other Ambulatory Visit: Payer: Self-pay

## 2024-09-01 MED ORDER — GABAPENTIN 400 MG PO CAPS
2400.0000 mg | ORAL_CAPSULE | Freq: Every day | ORAL | 0 refills | Status: AC
  Filled 2024-09-05: qty 540, 90d supply, fill #0

## 2024-09-02 ENCOUNTER — Other Ambulatory Visit (HOSPITAL_COMMUNITY): Payer: Self-pay

## 2024-09-03 ENCOUNTER — Other Ambulatory Visit: Payer: Self-pay

## 2024-09-03 DIAGNOSIS — Z87891 Personal history of nicotine dependence: Secondary | ICD-10-CM

## 2024-09-03 DIAGNOSIS — Z122 Encounter for screening for malignant neoplasm of respiratory organs: Secondary | ICD-10-CM

## 2024-09-03 DIAGNOSIS — F17210 Nicotine dependence, cigarettes, uncomplicated: Secondary | ICD-10-CM

## 2024-09-05 ENCOUNTER — Other Ambulatory Visit (HOSPITAL_BASED_OUTPATIENT_CLINIC_OR_DEPARTMENT_OTHER): Payer: Self-pay

## 2024-09-05 ENCOUNTER — Other Ambulatory Visit: Payer: Self-pay

## 2024-09-11 ENCOUNTER — Other Ambulatory Visit (HOSPITAL_COMMUNITY): Payer: Self-pay

## 2024-09-12 ENCOUNTER — Other Ambulatory Visit: Payer: Self-pay

## 2024-09-12 ENCOUNTER — Other Ambulatory Visit (HOSPITAL_COMMUNITY): Payer: Self-pay

## 2024-09-12 MED ORDER — AMPHETAMINE-DEXTROAMPHETAMINE 20 MG PO TABS
20.0000 mg | ORAL_TABLET | Freq: Two times a day (BID) | ORAL | 0 refills | Status: AC
  Filled 2024-09-12: qty 180, 90d supply, fill #0

## 2024-09-14 ENCOUNTER — Inpatient Hospital Stay (HOSPITAL_COMMUNITY)

## 2024-09-14 ENCOUNTER — Other Ambulatory Visit: Payer: Self-pay

## 2024-09-14 ENCOUNTER — Inpatient Hospital Stay (HOSPITAL_COMMUNITY)
Admission: EM | Admit: 2024-09-14 | Discharge: 2024-09-17 | DRG: 521 | Disposition: A | Discharge status: Home Health | Attending: Internal Medicine | Admitting: Internal Medicine

## 2024-09-14 ENCOUNTER — Inpatient Hospital Stay (HOSPITAL_COMMUNITY): Admitting: Anesthesiology

## 2024-09-14 ENCOUNTER — Encounter (HOSPITAL_COMMUNITY)
Admission: EM | Disposition: A | Discharge status: Home Health | Payer: Self-pay | Source: Home / Self Care | Attending: Internal Medicine

## 2024-09-14 ENCOUNTER — Emergency Department (HOSPITAL_COMMUNITY)

## 2024-09-14 ENCOUNTER — Encounter (HOSPITAL_COMMUNITY): Payer: Self-pay | Admitting: Emergency Medicine

## 2024-09-14 DIAGNOSIS — F17210 Nicotine dependence, cigarettes, uncomplicated: Secondary | ICD-10-CM | POA: Diagnosis present

## 2024-09-14 DIAGNOSIS — Z811 Family history of alcohol abuse and dependence: Secondary | ICD-10-CM

## 2024-09-14 DIAGNOSIS — D72829 Elevated white blood cell count, unspecified: Secondary | ICD-10-CM | POA: Diagnosis present

## 2024-09-14 DIAGNOSIS — Z681 Body mass index (BMI) 19 or less, adult: Secondary | ICD-10-CM | POA: Diagnosis not present

## 2024-09-14 DIAGNOSIS — Z8249 Family history of ischemic heart disease and other diseases of the circulatory system: Secondary | ICD-10-CM

## 2024-09-14 DIAGNOSIS — M810 Age-related osteoporosis without current pathological fracture: Secondary | ICD-10-CM | POA: Diagnosis present

## 2024-09-14 DIAGNOSIS — S72002A Fracture of unspecified part of neck of left femur, initial encounter for closed fracture: Principal | ICD-10-CM | POA: Diagnosis present

## 2024-09-14 DIAGNOSIS — H409 Unspecified glaucoma: Secondary | ICD-10-CM | POA: Diagnosis present

## 2024-09-14 DIAGNOSIS — Z79899 Other long term (current) drug therapy: Secondary | ICD-10-CM | POA: Diagnosis not present

## 2024-09-14 DIAGNOSIS — Z818 Family history of other mental and behavioral disorders: Secondary | ICD-10-CM | POA: Diagnosis not present

## 2024-09-14 DIAGNOSIS — Z8261 Family history of arthritis: Secondary | ICD-10-CM

## 2024-09-14 DIAGNOSIS — E785 Hyperlipidemia, unspecified: Secondary | ICD-10-CM | POA: Diagnosis present

## 2024-09-14 DIAGNOSIS — Z83719 Family history of colon polyps, unspecified: Secondary | ICD-10-CM | POA: Diagnosis not present

## 2024-09-14 DIAGNOSIS — Z823 Family history of stroke: Secondary | ICD-10-CM

## 2024-09-14 DIAGNOSIS — Z8041 Family history of malignant neoplasm of ovary: Secondary | ICD-10-CM

## 2024-09-14 DIAGNOSIS — I10 Essential (primary) hypertension: Secondary | ICD-10-CM | POA: Diagnosis present

## 2024-09-14 DIAGNOSIS — F909 Attention-deficit hyperactivity disorder, unspecified type: Secondary | ICD-10-CM | POA: Diagnosis present

## 2024-09-14 DIAGNOSIS — Z807 Family history of other malignant neoplasms of lymphoid, hematopoietic and related tissues: Secondary | ICD-10-CM | POA: Diagnosis not present

## 2024-09-14 DIAGNOSIS — Z8049 Family history of malignant neoplasm of other genital organs: Secondary | ICD-10-CM

## 2024-09-14 DIAGNOSIS — Y92008 Other place in unspecified non-institutional (private) residence as the place of occurrence of the external cause: Secondary | ICD-10-CM | POA: Diagnosis not present

## 2024-09-14 DIAGNOSIS — Z825 Family history of asthma and other chronic lower respiratory diseases: Secondary | ICD-10-CM | POA: Diagnosis not present

## 2024-09-14 DIAGNOSIS — Z83438 Family history of other disorder of lipoprotein metabolism and other lipidemia: Secondary | ICD-10-CM

## 2024-09-14 DIAGNOSIS — F32A Depression, unspecified: Secondary | ICD-10-CM | POA: Diagnosis present

## 2024-09-14 DIAGNOSIS — G4700 Insomnia, unspecified: Secondary | ICD-10-CM | POA: Diagnosis present

## 2024-09-14 DIAGNOSIS — D62 Acute posthemorrhagic anemia: Secondary | ICD-10-CM | POA: Diagnosis not present

## 2024-09-14 DIAGNOSIS — S72009A Fracture of unspecified part of neck of unspecified femur, initial encounter for closed fracture: Secondary | ICD-10-CM | POA: Diagnosis present

## 2024-09-14 DIAGNOSIS — Z888 Allergy status to other drugs, medicaments and biological substances status: Secondary | ICD-10-CM

## 2024-09-14 DIAGNOSIS — Z803 Family history of malignant neoplasm of breast: Secondary | ICD-10-CM

## 2024-09-14 DIAGNOSIS — Z808 Family history of malignant neoplasm of other organs or systems: Secondary | ICD-10-CM

## 2024-09-14 DIAGNOSIS — F419 Anxiety disorder, unspecified: Secondary | ICD-10-CM | POA: Diagnosis present

## 2024-09-14 DIAGNOSIS — S72012A Unspecified intracapsular fracture of left femur, initial encounter for closed fracture: Principal | ICD-10-CM | POA: Diagnosis present

## 2024-09-14 DIAGNOSIS — E43 Unspecified severe protein-calorie malnutrition: Secondary | ICD-10-CM | POA: Diagnosis present

## 2024-09-14 DIAGNOSIS — J449 Chronic obstructive pulmonary disease, unspecified: Secondary | ICD-10-CM | POA: Diagnosis present

## 2024-09-14 DIAGNOSIS — Z800 Family history of malignant neoplasm of digestive organs: Secondary | ICD-10-CM

## 2024-09-14 DIAGNOSIS — Z8051 Family history of malignant neoplasm of kidney: Secondary | ICD-10-CM

## 2024-09-14 DIAGNOSIS — F17200 Nicotine dependence, unspecified, uncomplicated: Secondary | ICD-10-CM | POA: Diagnosis present

## 2024-09-14 DIAGNOSIS — W010XXA Fall on same level from slipping, tripping and stumbling without subsequent striking against object, initial encounter: Secondary | ICD-10-CM | POA: Diagnosis present

## 2024-09-14 DIAGNOSIS — F418 Other specified anxiety disorders: Secondary | ICD-10-CM | POA: Diagnosis present

## 2024-09-14 HISTORY — PX: TOTAL HIP ARTHROPLASTY: SHX124

## 2024-09-14 LAB — COMPREHENSIVE METABOLIC PANEL WITH GFR
ALT: 26 U/L (ref 0–44)
AST: 36 U/L (ref 15–41)
Albumin: 4.7 g/dL (ref 3.5–5.0)
Alkaline Phosphatase: 104 U/L (ref 38–126)
Anion gap: 9 (ref 5–15)
BUN: 14 mg/dL (ref 8–23)
CO2: 29 mmol/L (ref 22–32)
Calcium: 10 mg/dL (ref 8.9–10.3)
Chloride: 100 mmol/L (ref 98–111)
Creatinine, Ser: 0.63 mg/dL (ref 0.44–1.00)
GFR, Estimated: >60 mL/min
Glucose, Bld: 108 mg/dL — ABNORMAL HIGH (ref 70–99)
Potassium: 3.9 mmol/L (ref 3.5–5.1)
Sodium: 138 mmol/L (ref 135–145)
Total Bilirubin: 0.2 mg/dL (ref 0.0–1.2)
Total Protein: 7.8 g/dL (ref 6.5–8.1)

## 2024-09-14 LAB — CBC
HCT: 40.2 % (ref 36.0–46.0)
Hemoglobin: 13.2 g/dL (ref 12.0–15.0)
MCH: 31.6 pg (ref 26.0–34.0)
MCHC: 32.8 g/dL (ref 30.0–36.0)
MCV: 96.2 fL (ref 80.0–100.0)
Platelets: 367 K/uL (ref 150–400)
RBC: 4.18 MIL/uL (ref 3.87–5.11)
RDW: 13.1 % (ref 11.5–15.5)
WBC: 10.6 K/uL — ABNORMAL HIGH (ref 4.0–10.5)
nRBC: 0 % (ref 0.0–0.2)

## 2024-09-14 LAB — TYPE AND SCREEN
ABO/RH(D): A POS
Antibody Screen: NEGATIVE

## 2024-09-14 LAB — ABO/RH: ABO/RH(D): A POS

## 2024-09-14 SURGERY — ARTHROPLASTY, HIP, TOTAL, ANTERIOR APPROACH
Anesthesia: General | Site: Hip | Laterality: Left

## 2024-09-14 MED ORDER — TRANEXAMIC ACID-NACL 1000-0.7 MG/100ML-% IV SOLN
INTRAVENOUS | Status: AC
  Filled 2024-09-14: qty 100

## 2024-09-14 MED ORDER — FENTANYL CITRATE (PF) 100 MCG/2ML IJ SOLN
INTRAMUSCULAR | Status: DC | PRN
  Administered 2024-09-14: 50 ug via INTRAVENOUS
  Administered 2024-09-14: 100 ug via INTRAVENOUS
  Administered 2024-09-14: 50 ug via INTRAVENOUS

## 2024-09-14 MED ORDER — FENTANYL CITRATE (PF) 50 MCG/ML IJ SOSY
100.0000 ug | PREFILLED_SYRINGE | Freq: Once | INTRAMUSCULAR | Status: AC
  Administered 2024-09-14: 100 ug via INTRAVENOUS
  Filled 2024-09-14: qty 2

## 2024-09-14 MED ORDER — HYDROCHLOROTHIAZIDE 12.5 MG PO TABS
12.5000 mg | ORAL_TABLET | Freq: Every day | ORAL | Status: DC
  Administered 2024-09-15 – 2024-09-17 (×3): 12.5 mg via ORAL
  Filled 2024-09-14 (×3): qty 1

## 2024-09-14 MED ORDER — ROCURONIUM BROMIDE 10 MG/ML (PF) SYRINGE
PREFILLED_SYRINGE | INTRAVENOUS | Status: DC | PRN
  Administered 2024-09-14: 40 mg via INTRAVENOUS

## 2024-09-14 MED ORDER — ROCURONIUM BROMIDE 10 MG/ML (PF) SYRINGE
PREFILLED_SYRINGE | INTRAVENOUS | Status: AC
  Filled 2024-09-14: qty 10

## 2024-09-14 MED ORDER — ACETAMINOPHEN 650 MG RE SUPP: 650.0000 mg | Freq: Four times a day (QID) | RECTAL | Status: DC | PRN

## 2024-09-14 MED ORDER — TRANEXAMIC ACID-NACL 1000-0.7 MG/100ML-% IV SOLN
1000.0000 mg | Freq: Once | INTRAVENOUS | Status: AC
  Administered 2024-09-14: 1000 mg via INTRAVENOUS
  Filled 2024-09-14: qty 100

## 2024-09-14 MED ORDER — ONDANSETRON HCL 4 MG/2ML IJ SOLN: 4.0000 mg | Freq: Four times a day (QID) | INTRAMUSCULAR | Status: DC | PRN

## 2024-09-14 MED ORDER — NICOTINE POLACRILEX 2 MG MT GUM: 2.0000 mg | CHEWING_GUM | OROMUCOSAL | Status: DC | PRN

## 2024-09-14 MED ORDER — FENTANYL CITRATE (PF) 100 MCG/2ML IJ SOLN
INTRAMUSCULAR | Status: AC
  Filled 2024-09-14: qty 2

## 2024-09-14 MED ORDER — FENTANYL CITRATE (PF) 50 MCG/ML IJ SOSY
50.0000 ug | PREFILLED_SYRINGE | Freq: Once | INTRAMUSCULAR | Status: AC
  Administered 2024-09-14: 50 ug via INTRAVENOUS
  Filled 2024-09-14: qty 1

## 2024-09-14 MED ORDER — CEFAZOLIN SODIUM-DEXTROSE 2-4 GM/100ML-% IV SOLN
INTRAVENOUS | Status: AC
  Filled 2024-09-14: qty 100

## 2024-09-14 MED ORDER — OXYCODONE HCL 5 MG/5ML PO SOLN: 5.0000 mg | Freq: Once | ORAL | Status: DC | PRN

## 2024-09-14 MED ORDER — CEFAZOLIN SODIUM-DEXTROSE 2-4 GM/100ML-% IV SOLN
2.0000 g | INTRAVENOUS | Status: AC
  Administered 2024-09-14: 2 g via INTRAVENOUS
  Filled 2024-09-14: qty 100

## 2024-09-14 MED ORDER — APIXABAN 2.5 MG PO TABS
2.5000 mg | ORAL_TABLET | Freq: Two times a day (BID) | ORAL | Status: DC
  Administered 2024-09-15 – 2024-09-17 (×5): 2.5 mg via ORAL
  Filled 2024-09-14 (×5): qty 1

## 2024-09-14 MED ORDER — BUPIVACAINE HCL 0.5 % IJ SOLN
50.0000 mL | Freq: Once | INTRAMUSCULAR | Status: AC
  Administered 2024-09-14: 50 mL
  Filled 2024-09-14: qty 50

## 2024-09-14 MED ORDER — ONDANSETRON HCL 4 MG/2ML IJ SOLN
4.0000 mg | Freq: Once | INTRAMUSCULAR | Status: AC
  Administered 2024-09-14: 4 mg via INTRAVENOUS
  Filled 2024-09-14: qty 2

## 2024-09-14 MED ORDER — BUPIVACAINE HCL 0.5 % IJ SOLN
20.0000 mL | INTRAMUSCULAR | Status: DC
  Administered 2024-09-14: 20 mL

## 2024-09-14 MED ORDER — OXYCODONE HCL 5 MG PO TABS: 5.0000 mg | ORAL_TABLET | Freq: Once | ORAL | Status: DC | PRN

## 2024-09-14 MED ORDER — HYDRALAZINE HCL 20 MG/ML IJ SOLN: 5.0000 mg | Freq: Four times a day (QID) | INTRAMUSCULAR | Status: DC | PRN

## 2024-09-14 MED ORDER — ISOPROPYL ALCOHOL 70 % SOLN
Status: DC | PRN
  Administered 2024-09-14: 1 via TOPICAL

## 2024-09-14 MED ORDER — LOSARTAN POTASSIUM 50 MG PO TABS
100.0000 mg | ORAL_TABLET | Freq: Every day | ORAL | Status: DC
  Administered 2024-09-15 – 2024-09-17 (×3): 100 mg via ORAL
  Filled 2024-09-14 (×3): qty 2

## 2024-09-14 MED ORDER — CYCLOSPORINE 0.05 % OP EMUL
1.0000 [drp] | Freq: Two times a day (BID) | OPHTHALMIC | Status: DC
  Filled 2024-09-14 (×2): qty 30

## 2024-09-14 MED ORDER — BUPIVACAINE-MELOXICAM ER 400-12 MG/14ML IJ SOLN
INTRAMUSCULAR | Status: AC
  Filled 2024-09-14: qty 1

## 2024-09-14 MED ORDER — ACETAMINOPHEN 10 MG/ML IV SOLN
INTRAVENOUS | Status: AC
  Filled 2024-09-14: qty 100

## 2024-09-14 MED ORDER — VANCOMYCIN HCL 1 G IV SOLR
INTRAVENOUS | Status: DC | PRN
  Administered 2024-09-14: 1000 mg via TOPICAL

## 2024-09-14 MED ORDER — CLONIDINE HCL 0.2 MG PO TABS
0.2000 mg | ORAL_TABLET | Freq: Every day | ORAL | Status: DC
  Filled 2024-09-14: qty 1
  Filled 2024-09-14: qty 2

## 2024-09-14 MED ORDER — METHOCARBAMOL 500 MG PO TABS
500.0000 mg | ORAL_TABLET | Freq: Three times a day (TID) | ORAL | Status: DC | PRN
  Administered 2024-09-14 – 2024-09-16 (×4): 500 mg via ORAL
  Filled 2024-09-14 (×4): qty 1

## 2024-09-14 MED ORDER — HYDROCODONE-ACETAMINOPHEN 5-325 MG PO TABS: 1.0000 | ORAL_TABLET | Freq: Two times a day (BID) | ORAL | Status: DC | PRN

## 2024-09-14 MED ORDER — SUGAMMADEX SODIUM 200 MG/2ML IV SOLN
INTRAVENOUS | Status: AC
  Filled 2024-09-14: qty 2

## 2024-09-14 MED ORDER — AMISULPRIDE (ANTIEMETIC) 5 MG/2ML IV SOLN: 10.0000 mg | Freq: Once | INTRAVENOUS | Status: DC | PRN

## 2024-09-14 MED ORDER — GABAPENTIN 400 MG PO CAPS
2400.0000 mg | ORAL_CAPSULE | Freq: Every day | ORAL | Status: DC
  Administered 2024-09-14 – 2024-09-16 (×3): 2400 mg via ORAL
  Filled 2024-09-14 (×3): qty 6

## 2024-09-14 MED ORDER — ONDANSETRON HCL 4 MG PO TABS: 4.0000 mg | ORAL_TABLET | Freq: Four times a day (QID) | ORAL | Status: DC | PRN

## 2024-09-14 MED ORDER — TRANEXAMIC ACID 1000 MG/10ML IV SOLN
INTRAVENOUS | Status: DC | PRN
  Administered 2024-09-14: 2000 mg via TOPICAL

## 2024-09-14 MED ORDER — LIDOCAINE HCL (PF) 1 % IJ SOLN
5.0000 mL | INTRAMUSCULAR | Status: DC
  Administered 2024-09-14: 5 mL

## 2024-09-14 MED ORDER — LIDOCAINE HCL (PF) 2 % IJ SOLN
INTRAMUSCULAR | Status: AC
  Filled 2024-09-14: qty 5

## 2024-09-14 MED ORDER — POLYETHYLENE GLYCOL 3350 17 G PO PACK: 17.0000 g | PACK | Freq: Every day | ORAL | Status: DC | PRN

## 2024-09-14 MED ORDER — PRONTOSAN WOUND IRRIGATION OPTIME
TOPICAL | Status: DC | PRN
  Administered 2024-09-14: 1 via TOPICAL

## 2024-09-14 MED ORDER — VORTIOXETINE HBR 5 MG PO TABS
20.0000 mg | ORAL_TABLET | Freq: Every day | ORAL | Status: DC
  Administered 2024-09-14 – 2024-09-17 (×4): 20 mg via ORAL
  Filled 2024-09-14 (×4): qty 4

## 2024-09-14 MED ORDER — BUPIVACAINE-MELOXICAM ER 200-6 MG/7ML IJ SOLN
INTRAMUSCULAR | Status: DC | PRN
  Administered 2024-09-14: 400 mg

## 2024-09-14 MED ORDER — SUGAMMADEX SODIUM 200 MG/2ML IV SOLN
INTRAVENOUS | Status: DC | PRN
  Administered 2024-09-14: 100 mg via INTRAVENOUS

## 2024-09-14 MED ORDER — NICOTINE 14 MG/24HR TD PT24
14.0000 mg | MEDICATED_PATCH | Freq: Every day | TRANSDERMAL | Status: DC
  Administered 2024-09-14 – 2024-09-17 (×4): 14 mg via TRANSDERMAL
  Filled 2024-09-14 (×4): qty 1

## 2024-09-14 MED ORDER — PROPOFOL 10 MG/ML IV BOLUS
INTRAVENOUS | Status: DC | PRN
  Administered 2024-09-14: 100 mg via INTRAVENOUS

## 2024-09-14 MED ORDER — VANCOMYCIN HCL 1000 MG IV SOLR
INTRAVENOUS | Status: AC
  Filled 2024-09-14: qty 20

## 2024-09-14 MED ORDER — TRANEXAMIC ACID-NACL 1000-0.7 MG/100ML-% IV SOLN
1000.0000 mg | INTRAVENOUS | Status: AC
  Administered 2024-09-14: 1000 mg via INTRAVENOUS

## 2024-09-14 MED ORDER — MENTHOL 3 MG MT LOZG: 1.0000 | LOZENGE | OROMUCOSAL | Status: DC | PRN

## 2024-09-14 MED ORDER — SODIUM CHLORIDE 0.9 % IV SOLN: INTRAVENOUS | Status: DC

## 2024-09-14 MED ORDER — MORPHINE SULFATE (PF) 2 MG/ML IV SOLN
0.5000 mg | INTRAVENOUS | Status: DC | PRN
  Administered 2024-09-14: 1 mg via INTRAVENOUS
  Filled 2024-09-14: qty 1

## 2024-09-14 MED ORDER — MAGNESIUM CITRATE PO SOLN: 1.0000 | Freq: Once | ORAL | Status: DC | PRN

## 2024-09-14 MED ORDER — HYDROMORPHONE HCL 1 MG/ML IJ SOLN
0.5000 mg | Freq: Once | INTRAMUSCULAR | Status: AC
  Administered 2024-09-14: 0.5 mg via INTRAVENOUS
  Filled 2024-09-14: qty 1

## 2024-09-14 MED ORDER — DEXAMETHASONE SODIUM PHOSPHATE 4 MG/ML IJ SOLN
INTRAMUSCULAR | Status: DC | PRN
  Administered 2024-09-14: 5 mg via INTRAVENOUS

## 2024-09-14 MED ORDER — ONDANSETRON HCL 4 MG/2ML IJ SOLN
INTRAMUSCULAR | Status: AC
  Filled 2024-09-14: qty 2

## 2024-09-14 MED ORDER — GLYCOPYRROLATE 0.2 MG/ML IJ SOLN
INTRAMUSCULAR | Status: DC | PRN
  Administered 2024-09-14: .2 mg via INTRAVENOUS

## 2024-09-14 MED ORDER — AMPHETAMINE-DEXTROAMPHETAMINE 20 MG PO TABS: 20.0000 mg | ORAL_TABLET | Freq: Two times a day (BID) | ORAL | Status: DC

## 2024-09-14 MED ORDER — ACETAMINOPHEN 10 MG/ML IV SOLN
INTRAVENOUS | Status: DC | PRN
  Administered 2024-09-14: 1000 mg via INTRAVENOUS

## 2024-09-14 MED ORDER — POVIDONE-IODINE 10 % EX SWAB: 2.0000 | Freq: Once | CUTANEOUS | Status: DC

## 2024-09-14 MED ORDER — DIAZEPAM 5 MG/ML IJ SOLN
2.5000 mg | Freq: Once | INTRAMUSCULAR | Status: AC
  Administered 2024-09-14: 2.5 mg via INTRAVENOUS
  Filled 2024-09-14: qty 2

## 2024-09-14 MED ORDER — TRANEXAMIC ACID 1000 MG/10ML IV SOLN
2000.0000 mg | INTRAVENOUS | Status: DC
  Filled 2024-09-14: qty 20

## 2024-09-14 MED ORDER — METHOCARBAMOL 500 MG PO TABS: 500.0000 mg | ORAL_TABLET | Freq: Four times a day (QID) | ORAL | Status: DC | PRN

## 2024-09-14 MED ORDER — PHENYLEPHRINE 80 MCG/ML (10ML) SYRINGE FOR IV PUSH (FOR BLOOD PRESSURE SUPPORT)
PREFILLED_SYRINGE | INTRAVENOUS | Status: DC | PRN
  Administered 2024-09-14: 80 ug via INTRAVENOUS
  Administered 2024-09-14: 120 ug via INTRAVENOUS

## 2024-09-14 MED ORDER — ROSUVASTATIN CALCIUM 5 MG PO TABS
5.0000 mg | ORAL_TABLET | Freq: Every day | ORAL | Status: DC
  Administered 2024-09-14 – 2024-09-17 (×4): 5 mg via ORAL
  Filled 2024-09-14 (×4): qty 1

## 2024-09-14 MED ORDER — ARTIFICIAL TEARS OPHTHALMIC OINT
TOPICAL_OINTMENT | OPHTHALMIC | Status: AC
  Filled 2024-09-14: qty 3.5

## 2024-09-14 MED ORDER — LACTATED RINGERS IV SOLN: INTRAVENOUS | Status: DC | PRN

## 2024-09-14 MED ORDER — METHOCARBAMOL 1000 MG/10ML IJ SOLN: 500.0000 mg | Freq: Four times a day (QID) | INTRAMUSCULAR | Status: DC | PRN

## 2024-09-14 MED ORDER — ACETAMINOPHEN 325 MG PO TABS: 325.0000 mg | ORAL_TABLET | Freq: Four times a day (QID) | ORAL | Status: DC | PRN

## 2024-09-14 MED ORDER — ACETAMINOPHEN 500 MG PO TABS: 1000.0000 mg | ORAL_TABLET | Freq: Once | ORAL | Status: DC

## 2024-09-14 MED ORDER — LIDOCAINE 2% (20 MG/ML) 5 ML SYRINGE
INTRAMUSCULAR | Status: DC | PRN
  Administered 2024-09-14: 60 mg via INTRAVENOUS

## 2024-09-14 MED ORDER — HYDROMORPHONE HCL 1 MG/ML IJ SOLN
INTRAMUSCULAR | Status: DC | PRN
  Administered 2024-09-14: 1 mg via INTRAVENOUS

## 2024-09-14 MED ORDER — LIDOCAINE HCL (PF) 1 % IJ SOLN
30.0000 mL | Freq: Once | INTRAMUSCULAR | Status: AC
  Administered 2024-09-14: 30 mL
  Filled 2024-09-14: qty 30

## 2024-09-14 MED ORDER — PHENOL 1.4 % MT LIQD: 1.0000 | OROMUCOSAL | Status: DC | PRN

## 2024-09-14 MED ORDER — GLYCOPYRROLATE 0.2 MG/ML IJ SOLN
INTRAMUSCULAR | Status: AC
  Filled 2024-09-14: qty 1

## 2024-09-14 MED ORDER — CEFAZOLIN SODIUM-DEXTROSE 2-4 GM/100ML-% IV SOLN
2.0000 g | Freq: Four times a day (QID) | INTRAVENOUS | Status: AC
  Administered 2024-09-14 – 2024-09-15 (×3): 2 g via INTRAVENOUS
  Filled 2024-09-14 (×3): qty 100

## 2024-09-14 MED ORDER — 0.9 % SODIUM CHLORIDE (POUR BTL) OPTIME
TOPICAL | Status: DC | PRN
  Administered 2024-09-14: 1000 mL

## 2024-09-14 MED ORDER — HYDROMORPHONE HCL 1 MG/ML IJ SOLN: 0.5000 mg | INTRAMUSCULAR | Status: DC | PRN

## 2024-09-14 MED ORDER — HYDROMORPHONE HCL 1 MG/ML IJ SOLN: 0.2500 mg | INTRAMUSCULAR | Status: DC | PRN

## 2024-09-14 MED ORDER — SODIUM CHLORIDE 0.9 % IR SOLN
Status: DC | PRN
  Administered 2024-09-14: 1000 mL

## 2024-09-14 MED ORDER — HYDROMORPHONE HCL 1 MG/ML IJ SOLN
1.0000 mg | Freq: Once | INTRAMUSCULAR | Status: AC
  Administered 2024-09-14: 1 mg via INTRAVENOUS
  Filled 2024-09-14: qty 1

## 2024-09-14 MED ORDER — ALUM & MAG HYDROXIDE-SIMETH 200-200-20 MG/5ML PO SUSP: 30.0000 mL | ORAL | Status: DC | PRN

## 2024-09-14 MED ORDER — HYDROCODONE-ACETAMINOPHEN 5-325 MG PO TABS: 1.0000 | ORAL_TABLET | Freq: Four times a day (QID) | ORAL | Status: DC | PRN

## 2024-09-14 MED ORDER — HYDROCODONE-ACETAMINOPHEN 7.5-325 MG PO TABS
1.0000 | ORAL_TABLET | Freq: Every day | ORAL | Status: DC | PRN
  Administered 2024-09-14: 1 via ORAL
  Administered 2024-09-15: 2 via ORAL
  Filled 2024-09-14: qty 2
  Filled 2024-09-14: qty 1

## 2024-09-14 MED ORDER — METHOCARBAMOL 1000 MG/10ML IJ SOLN: 500.0000 mg | Freq: Three times a day (TID) | INTRAMUSCULAR | Status: DC | PRN

## 2024-09-14 MED ORDER — GUANFACINE HCL ER 1 MG PO TB24
1.0000 mg | ORAL_TABLET | Freq: Every morning | ORAL | Status: DC
  Administered 2024-09-15 – 2024-09-17 (×3): 1 mg via ORAL
  Filled 2024-09-14 (×3): qty 1

## 2024-09-14 MED ORDER — ACETAMINOPHEN 325 MG PO TABS: 650.0000 mg | ORAL_TABLET | Freq: Four times a day (QID) | ORAL | Status: DC | PRN

## 2024-09-14 MED ORDER — ACETAMINOPHEN 500 MG PO TABS
500.0000 mg | ORAL_TABLET | Freq: Four times a day (QID) | ORAL | Status: AC
  Filled 2024-09-14: qty 1

## 2024-09-14 MED ORDER — SORBITOL 70 % SOLN: 30.0000 mL | Freq: Every day | Status: DC | PRN

## 2024-09-14 MED ORDER — ONDANSETRON HCL 4 MG/2ML IJ SOLN
INTRAMUSCULAR | Status: DC | PRN
  Administered 2024-09-14: 4 mg via INTRAVENOUS

## 2024-09-14 MED ORDER — DOCUSATE SODIUM 100 MG PO CAPS
100.0000 mg | ORAL_CAPSULE | Freq: Two times a day (BID) | ORAL | Status: DC
  Administered 2024-09-14 – 2024-09-17 (×6): 100 mg via ORAL
  Filled 2024-09-14 (×6): qty 1

## 2024-09-14 MED ORDER — HYDROMORPHONE HCL 2 MG/ML IJ SOLN
INTRAMUSCULAR | Status: AC
  Filled 2024-09-14: qty 1

## 2024-09-14 SURGICAL SUPPLY — 44 items
BAG COUNTER SPONGE SURGICOUNT (BAG) IMPLANT
BAG ZIPLOCK 12X15 (MISCELLANEOUS) ×1 IMPLANT
BLADE SAG 18X100X1.27 (BLADE) ×1 IMPLANT
CLSR STERI-STRIP ANTIMIC 1/2X4 (GAUZE/BANDAGES/DRESSINGS) IMPLANT
COVER PERINEAL POST (MISCELLANEOUS) ×1 IMPLANT
COVER SURGICAL LIGHT HANDLE (MISCELLANEOUS) ×1 IMPLANT
CUP ACETAB EMPH CMTLS 50 3H (Cup) IMPLANT
DERMABOND ADVANCED .7 DNX12 (GAUZE/BANDAGES/DRESSINGS) IMPLANT
DRAPE FOOT SWITCH (DRAPES) ×1 IMPLANT
DRAPE IMP U-DRAPE 54X76 (DRAPES) ×1 IMPLANT
DRAPE POUCH INSTRU U-SHP 10X18 (DRAPES) ×1 IMPLANT
DRAPE STERI IOBAN 125X83 (DRAPES) ×1 IMPLANT
DRAPE TOP 10253 STERILE (DRAPES) ×2 IMPLANT
DRAPE U-SHAPE 47X51 STRL (DRAPES) ×1 IMPLANT
DRESSING AQUACEL AG SP 3.5X6 (GAUZE/BANDAGES/DRESSINGS) IMPLANT
DRSG AQUACEL AG ADV 3.5X10 (GAUZE/BANDAGES/DRESSINGS) ×1 IMPLANT
DURAPREP 26ML APPLICATOR (WOUND CARE) ×2 IMPLANT
ELECT PENCIL ROCKER SW 15FT (MISCELLANEOUS) ×1 IMPLANT
ELECT REM PT RETURN 15FT ADLT (MISCELLANEOUS) ×1 IMPLANT
GLOVE BIOGEL PI IND STRL 7.0 (GLOVE) ×1 IMPLANT
GLOVE BIOGEL PI IND STRL 7.5 (GLOVE) ×1 IMPLANT
GLOVE ECLIPSE 7.0 STRL STRAW (GLOVE) ×3 IMPLANT
GLOVE SURG SYN 7.5 PF PI (GLOVE) ×3 IMPLANT
GOWN SRG XL LVL 4 BRTHBL STRL (GOWNS) ×2 IMPLANT
HEAD CERAMIC DELTA 36 PLUS 1.5 (Hips) IMPLANT
HOOD PEEL AWAY T7 (MISCELLANEOUS) ×3 IMPLANT
KIT TURNOVER KIT A (KITS) ×1 IMPLANT
LINER ACET EMPH NTRL 36X50 +4 (Liner) IMPLANT
MARKER SKIN DUAL TIP RULER LAB (MISCELLANEOUS) ×1 IMPLANT
NDL SPNL 18GX3.5 QUINCKE PK (NEEDLE) ×1 IMPLANT
NEEDLE SPNL 18GX3.5 QUINCKE PK (NEEDLE) ×1 IMPLANT
PACK ANTERIOR HIP CUSTOM (KITS) ×1 IMPLANT
SCREW 6.5MMX30MM (Screw) IMPLANT
SET HNDPC FAN SPRY TIP SCT (DISPOSABLE) ×1 IMPLANT
SOLUTION PRONTOSAN WOUND 350ML (IRRIGATION / IRRIGATOR) ×1 IMPLANT
STEM FEMORAL SZ5 HIGH ACTIS (Stem) IMPLANT
SUT ETHIBOND 2 V 37 (SUTURE) ×1 IMPLANT
SUT ETHILON 2 0 PS N (SUTURE) ×1 IMPLANT
SUT MNCRL AB 3-0 PS2 18 (SUTURE) IMPLANT
SUT NYLON 3 0 (SUTURE) IMPLANT
SUT STRATAFIX PDS+ 0 24IN (SUTURE) ×1 IMPLANT
SUT VIC AB 2-0 CT1 TAPERPNT 27 (SUTURE) ×2 IMPLANT
TRAY CATH INTERMITTENT SS 16FR (CATHETERS) IMPLANT
TUBE SUCTION HIGH CAP CLEAR NV (SUCTIONS) ×1 IMPLANT

## 2024-09-14 NOTE — Progress Notes (Signed)
. ° ° °  PROCEDURAL EXPEDITER PROGRESS NOTE  Patient Name: Jasmine Nelson  DOB:03-29-1956 Date of Admission: 09/14/2024  Date of Assessment:09/14/2024   -------------------------------------------------------------------------------------------------------------------   Brief clinical summary: Pt to OR today for L total hip anterior arthoplasty  Orders in place:  No   Communication with surgical team if no orders: N/A - a formal consult will take place in pre-op. Consent will be signed then  Labs, test, and orders reviewed: Y - T&S and surgical PCR placed  Requires surgical clearance:  No  Barriers noted: N/A   Intervention provided by [facility] team: N/A  Barrier resolved:  not applicable   -------------------------------------------------------------------------------------------------------------------  [employer], [place], [place] Please contact us  directly via secure chat (search for [facility]) or by calling us  at [phone] [facility]).

## 2024-09-14 NOTE — ED Triage Notes (Signed)
" °  Patient BIB EMS for L hip pain after fall that happened around midnight.  Patient states she had mechanical fall and landed on concrete slab.  No LOC.  No blood thinners.  Pain 10/10, sharp/stabbing.  Was laying outside for about 20 minutes before EMS arrived.  Family was aware but unable to get her off the ground.  "

## 2024-09-14 NOTE — Op Note (Signed)
 "  ARTHROPLASTY, HIP, TOTAL, ANTERIOR APPROACH  Procedure Note Jasmine Nelson   [id number]  Pre-op Diagnosis: LEFT Femoral neck FRACTURE     Post-op Diagnosis: same  Operative Findings Acute femoral neck fracture   Operative Procedures  1. Total hip replacement; Left hip; uncemented [id number]   Surgeon: Kay Nelson, M.D.  Assist: Ronal Jasmine Grave, PA-C   Anesthesia: general  Prosthesis: Depuy Acetabulum: Emphasys 50 mm Femur: Actis 5 HO Head: 36 mm size: +1.5 Liner: +4 neutral Bearing Type: ceramic/poly  Total Hip Arthroplasty (Anterior Approach) Op Note:  After informed consent was obtained and the operative extremity marked in the holding area, the patient was brought back to the operating room and placed supine on the HANA table. Next, the operative extremity was prepped and draped in normal sterile fashion. Surgical timeout occurred verifying patient identification, surgical site, surgical procedure and administration of antibiotics.  A 8 cm longitudinal incision was made starting from 2 fingerbreadths lateral and inferior to the ASIS towards the lateral aspect of the patella.  A Hueter approach to the hip was performed, using the interval between tensor fascia lata and sartorius.  Dissection was carried bluntly down onto the anterior hip capsule. The lateral femoral circumflex vessels were identified and coagulated. A capsulotomy was performed and the capsular flaps tagged for later repair.  Fracture hematoma was evacuated.  The neck osteotomy was performed 1 fingerbreadth above the lesser trochanter as a freshen up cut. The femoral head was removed, the acetabular rim was cleared of soft tissue and attention was turned to reaming the acetabulum.  Sequential reaming was performed under fluoroscopic guidance down to the floor of the cotyloid fossa. We reamed to a size 49 mm, and then impacted the acetabular shell. A 30 mm cancellous screw was placed to secure the shell.  A +4  neutral liner was then placed after irrigation and attention turned to the femur.  After placing the femoral hook, the leg was taken to externally rotated, extended and adducted position taking care to perform soft tissue releases to allow for adequate mobilization of the femur. Soft tissue was cleared from the shoulder of the greater trochanter and the hook elevator used to improve exposure of the proximal femur.  Lateral bone from the shoulder was rasped away for relief.  Sequential broaching performed up to a size 5.  Standard trial neck and +1.5 head were placed. The leg was brought back up to neutral and the construct reduced.  The position and sizing of components, offset and leg lengths were checked using fluoroscopy.  Based on fluoroscopic findings, we chose to retrial with high offset neck and +1.5 head ball which provided better stability, offset and did not overlengthen her leg.  Stability of the construct was checked in 45 degrees of hip extension and 90 degrees of external rotation without any subluxation, shuck or impingement of prosthesis. We dislocated the prosthesis, dropped the leg back into position, removed trial components, and irrigated copiously. The final stem and head were chosen then placed, the leg brought back up, the system reduced and fluoroscopy used to verify positioning.  Antibiotic irrigation was placed in the surgical wound.   We irrigated, obtained hemostasis and closed the capsule using #2 ethibond suture.  A topical mixture of 0.25% bupivacaine  and meloxicam  was placed deep to the fascia.  One gram of vancomycin  powder was placed in the surgical bed.   One gram of topical tranexamic acid  was injected into the joint.  The fascia was  closed with #1 stratafix, the deep fat layer was closed with 0 vicryl, the subcutaneous layers closed with 2.0 Vicryl Plus and the skin closed with 2.0 nylon and dermabond. A sterile dressing was applied. The patient was awakened in the operating  room and taken to recovery in stable condition.  All sponge, needle, and instrument counts were correct at the end of the case.   Jasmine Nelson, my PA, was a medical necessity for opening, closing, limb positioning, retracting, exposing, and overall facilitation and timely completion of the surgery.  Position: supine  Complications: see description of procedure.  Time Out: performed   Drains/Packing: none  Estimated blood loss: see anesthesia record  Returned to Recovery Room: in good condition.   Antibiotics: yes   Mechanical VTE (DVT) Prophylaxis: sequential compression devices, TED thigh-high  Chemical VTE (DVT) Prophylaxis: eliquis  POD 1   Fluid Replacement: see anesthesia record  Specimens Removed: 1 to pathology   Sponge and Instrument Count Correct? yes   PACU: portable radiograph - low AP   Plan/RTC: Return in 2 weeks for suture removal. Weight Bearing/Load Lower Extremity: full  Hip precautions: none Suture Removal: 2 weeks   N. Ozell Cummins, MD [facility] 10:55 AM   Implant Name Type Inv. Item Serial No. Manufacturer Lot No. LRB No. Used Action  CUP ACETAB EMPH CMTLS 50 3H - [id number] Cup CUP ACETAB EMPH CMTLS 50 3H  [employer] [id number] Left 1 Implanted  LINER ACET EMPH NTRL 36X50 +4 - [id number] Liner LINER ACET EMPH NTRL 36X50 +4  [employer] [id number] Left 1 Implanted  SCREW 6.5MMX30MM - [id number] Screw SCREW 6.5MMX30MM  [employer] [id number] Left 1 Implanted  STEM FEMORAL SZ5 HIGH ACTIS - [id number] Stem STEM FEMORAL SZ5 HIGH ACTIS  [employer] [id number] Left 1 Implanted  HEAD CERAMIC DELTA 36 PLUS 1.5 - [id number] Hips HEAD CERAMIC DELTA 36 PLUS 1.5  [employer] [id number] Left 1 Implanted   "

## 2024-09-14 NOTE — Anesthesia Preprocedure Evaluation (Addendum)
 "                                  Anesthesia Evaluation  Patient identified by MRN, date of birth, ID band Patient awake    Reviewed: Allergy & Precautions, NPO status , Patient's Chart, lab work & pertinent test results  History of Anesthesia Complications Negative for: history of anesthetic complications  Airway Mallampati: I  TM Distance: >3 FB Neck ROM: Full    Dental  (+) Dental Advisory Given Implant placed 09/10/2024:   Pulmonary neg shortness of breath, neg sleep apnea, COPD, neg recent URI, Current Smoker (smokes 1/2 ppd) and Patient abstained from smoking.   Pulmonary exam normal breath sounds clear to auscultation       Cardiovascular hypertension (clonidine , HCTZ, losartan ), Pt. on medications (-) angina (-) Past MI, (-) Cardiac Stents and (-) CABG (-) dysrhythmias  Rhythm:Regular Rate:Normal     Neuro/Psych  PSYCHIATRIC DISORDERS Anxiety Depression    negative neurological ROS     GI/Hepatic negative GI ROS, Neg liver ROS,,,  Endo/Other  negative endocrine ROS    Renal/GU negative Renal ROS     Musculoskeletal Osteoporosis    Abdominal   Peds  Hematology negative hematology ROS (+) Lab Results      Component                Value               Date                      WBC                      10.6 (H)            09/14/2024                HGB                      13.2                09/14/2024                HCT                      40.2                09/14/2024                MCV                      96.2                09/14/2024                PLT                      367                 09/14/2024              Anesthesia Other Findings glaucoma  Reproductive/Obstetrics                              Anesthesia Physical Anesthesia Plan  ASA: 2  Anesthesia Plan: General   Post-op Pain  Management: Tylenol  PO (pre-op)*   Induction: Intravenous  PONV Risk Score and Plan: 2 and Ondansetron ,  Dexamethasone  and Treatment may vary due to age or medical condition  Airway Management Planned: Oral ETT  Additional Equipment:   Intra-op Plan:   Post-operative Plan: Extubation in OR  Informed Consent: I have reviewed the patients History and Physical, chart, labs and discussed the procedure including the risks, benefits and alternatives for the proposed anesthesia with the patient or authorized representative who has indicated his/her understanding and acceptance.     Dental advisory given  Plan Discussed with: CRNA and Anesthesiologist  Anesthesia Plan Comments: (Risks of general anesthesia discussed including, but not limited to, sore throat, hoarse voice, chipped/damaged teeth, injury to vocal cords, nausea and vomiting, allergic reactions, lung infection, heart attack, stroke, and death. All questions answered. )         Anesthesia Quick Evaluation  "

## 2024-09-14 NOTE — Assessment & Plan Note (Signed)
 Patient reports smoking 0.5 packs a day - Patient counseled on importance of continued cessation - NRT while inpatient with nicotine  patch and as needed nicotine  gum

## 2024-09-14 NOTE — Discharge Instructions (Addendum)

## 2024-09-14 NOTE — Assessment & Plan Note (Signed)
-   As needed albuterol 

## 2024-09-14 NOTE — Assessment & Plan Note (Addendum)
-   Ortho consulted by EDP. Dr Jerri requesting admit to [facility] for operative repair today. Appreciate Orthopedics consultation and management - Pain control  - PT/OT evaluation post-op

## 2024-09-14 NOTE — Anesthesia Procedure Notes (Signed)
 Procedure Name: Intubation Date/Time: 09/14/2024 9:44 AM  Performed by: Judythe Tanda Aran, CRNAPre-anesthesia Checklist: Patient identified, Emergency Drugs available, Suction available and Patient being monitored Patient Re-evaluated:Patient Re-evaluated prior to induction Oxygen Delivery Method: Circle system utilized Preoxygenation: Pre-oxygenation with 100% oxygen Induction Type: IV induction Ventilation: Mask ventilation without difficulty Laryngoscope Size: 2 and Miller Grade View: Grade I Tube type: Oral Laser Tube: Cuffed inflated with minimal occlusive pressure - saline Tube size: 7.0 mm Number of attempts: 1 Airway Equipment and Method: Stylet Placement Confirmation: ETT inserted through vocal cords under direct vision, positive ETCO2 and breath sounds checked- equal and bilateral Secured at: 20 cm Tube secured with: Tape Dental Injury: Teeth and Oropharynx as per pre-operative assessment

## 2024-09-14 NOTE — Transfer of Care (Signed)
 Immediate Anesthesia Transfer of Care Note  Patient: Jasmine Nelson  Procedure(s) Performed: ARTHROPLASTY, HIP, TOTAL, ANTERIOR APPROACH (Left: Hip)  Patient Location: PACU  Anesthesia Type:General  Level of Consciousness: awake  Airway & Oxygen Therapy: Patient Spontanous Breathing and Patient connected to face mask  Post-op Assessment: Report given to RN and Post -op Vital signs reviewed and stable  Post vital signs: Reviewed and stable  Last Vitals:  Vitals Value Taken Time  BP 137/81 09/14/24 11:25  Temp 36.7 C 09/14/24 11:25  Pulse 87 09/14/24 11:28  Resp 8 09/14/24 11:28  SpO2 96 % 09/14/24 11:28  Vitals shown include unfiled device data.  Last Pain:  Vitals:   09/14/24 1125  TempSrc:   PainSc: 0-No pain         Complications: No notable events documented.

## 2024-09-14 NOTE — H&P (Addendum)
 "  History and Physical    Jasmine Nelson FMW:[id number] DOB: [birth year] DOA: 09/14/2024  DOS: the patient was seen and examined on 09/14/2024  PCP: Jasmine Apolinar POUR, MD   Patient coming from: Home  I have personally briefly reviewed patient's old medical records in [facility] and CareEverywhere  HPI:  Jasmine Nelson is a 69 y.o. year old female with past medical history of hypertension, hyperlipidemia, COPD, tobacco use disorder, anxiety, ADHD, and insomnia. She presents to [facility] ED after a mechanical fall onto her patio last night resulting in inability to ambulate with severe left hip pain.  Jasmine Nelson reports that as she was walking on her patio she inadvertently stepped out of one of her slippers causing her to lose her footing and ultimately falling onto her left hip.  She does endorse having 1 mixed drink prior to the fall.  She denies loss of consciousness or head strike.  She denies numbness or tingling to the left lower extremity.  She is currently able to wiggle her toes and sensation is intact with good PT and DP pulses.   ED Course: On arrival to [facility] ED patient was noted to be afebrile temp 36.6 C, BP 179/105, HR 79, RR 20, SpO2 98% on room air. CXR obtained and shows no acute cardiopulmonary process.  Plain film of left hip and pelvis obtained and shows acute displaced subcapital left femoral neck fracture.  No significant lab findings on CBC or metabolic panel.  She was given Diazepam , fentanyl , Dilaudid , and Zofran .  Dr. Jerri consulted by EDP with plans for operative repair of hip today.  ED physician subsequently performed a left lower extremity nerve block.  TRH contacted for admission.    Review of Systems: As mentioned in the history of present illness. All other systems reviewed and are negative.    Past Medical History:  Diagnosis Date   Allergy    Anxiety    Cataract    both eyes   Chicken pox    COPD (chronic obstructive pulmonary disease) (HCC)     Depression    Glaucoma    Hx of thyroid  disease    3rd grade  resolved    Hypertension    Ingestion of substance    3 04   Osteoporosis    Shift work sleep disorder     Past Surgical History:  Procedure Laterality Date   ADENOIDECTOMY  1959   BREAST BIOPSY Right 2019   COLONOSCOPY       reports that she has been smoking cigarettes. She has never used smokeless tobacco. She reports current alcohol  use. She reports that she does not use drugs.  Allergies[1]  Family History  Problem Relation Age of Onset   Cancer Mother 62       Died at 80 of Non-Hodgkins Lymphoma   Lymphoma Mother        47   Colon polyps Mother        many; required regular colonoscopies   Alcohol  abuse Father    COPD Father    Depression Father    Hyperlipidemia Father    Hypertension Father        deceased   Colon polyps Father        many; required regular colonoscopies   Colon cancer Sister        dx. 44-56   Other Sister        +CHEK2 mutation   COPD Maternal Aunt  Ovarian cancer Maternal Aunt 62   Stroke Maternal Grandmother    Heart Problems Maternal Grandmother    Cancer Maternal Grandfather 81       Brain Cancer   Arthritis Paternal Grandmother    Depression Paternal Grandmother    Glaucoma Paternal Grandmother    Breast cancer Paternal Grandmother        dx. 50s   Uterine cancer Paternal Grandmother        dx. 60s   Renal cancer Paternal Grandfather        smoker   Multiple myeloma Maternal Uncle        dx. well before his 10s   Ovarian cancer Other    Esophageal cancer Neg Hx    Stomach cancer Neg Hx    Rectal cancer Neg Hx     Prior to Admission medications  Medication Sig Start Date End Date Taking? Authorizing Provider  Abaloparatide  (TYMLOS ) 3120 MCG/1.56ML SOPN Inject 80 mcg into the skin daily. 06/04/24   Joane Artist RAMAN, MD  Alcohol  Swabs  [facility] ALCOHOL  PREP MEDIUM) 70 % PADS Use daily for Tymlos  injection 06/05/24   Corey, Evan S, MD   amphetamine -dextroamphetamine  (ADDERALL) 20 MG tablet Take 1 tablet (20 mg total) by mouth 2 (two) times daily. 09/12/24     Ca Phosphate-Cholecalciferol (CALCIUM  WITH D3 PO) Take by mouth.    [provider]  clobetasol  cream (TEMOVATE ) 0.05 % Apply 1 Application topically 2 (two) times daily. 04/18/23     cloNIDine  (CATAPRES ) 0.1 MG tablet Take 2-3 tablets (0.2-0.3 mg total) by mouth at bedtime. 03/24/24     cycloSPORINE , PF, 0.09 % SOLN Place 1 drop into both eyes 2 (two) times daily. 08/26/24     gabapentin  (NEURONTIN ) 400 MG capsule Take 6 capsules (2,400 mg total) by mouth at bedtime. (09-05-24) 09/05/24     guanFACINE  (INTUNIV ) 1 MG TB24 ER tablet Take 1 tablet (1 mg total) by mouth every morning. 08/26/24     hydrochlorothiazide  (HYDRODIURIL ) 12.5 MG tablet Take 1 tablet (12.5 mg total) by mouth daily. Please schedule appt for further refills 07/02/24   Panosh, Wanda K, MD  Insulin  Pen Needle (PEN NEEDLES) 31G X 5 MM MISC Use with Tymlos  daily 06/05/24   Corey, Evan S, MD  losartan  (COZAAR ) 100 MG tablet Take 1 tablet (100 mg total) by mouth daily. 03/25/24 03/25/25  Panosh, Wanda K, MD  Probiotic Product (ALIGN PO) Take by mouth.    [provider]  rosuvastatin  (CRESTOR ) 5 MG tablet Take 1 tablet (5 mg total) by mouth daily. 04/16/24   Panosh, Wanda K, MD  sharps container Use as needed 06/05/24   Corey, Evan S, MD  Suvorexant  (BELSOMRA ) 20 MG TABS Take 1 tablet (20 mg total) by mouth at bedtime. 03/16/24 03/16/24     Suvorexant  (BELSOMRA ) 20 MG TABS Take 1 tablet (20 mg total) by mouth at bedtime. 06/24/24     vortioxetine  HBr (TRINTELLIX ) 20 MG TABS tablet Take 1 tablet (20 mg total) by mouth daily. 09/08/23     vortioxetine  HBr (TRINTELLIX ) 20 MG TABS tablet Take 1 tablet (20 mg total) by mouth daily. 07/01/24       Physical Exam: Vitals:   09/14/24 0125 09/14/24 0415 09/14/24 0818 09/14/24 0845  BP:  (!) 142/78  (!) 173/90  Pulse:  91    Resp:  17  15  Temp:  98 F (36.7  C) 98.4 F (36.9 C) 98.6 F (37 C)  TempSrc:   Oral  SpO2:  90%  98%  Weight: 49.9 kg     Height: 5' 5 (1.651 m)       Physical Exam Vitals and nursing note reviewed.  HENT:     Head: Normocephalic.  Eyes:     Pupils: Pupils are equal, round, and reactive to light.  Cardiovascular:     Rate and Rhythm: Normal rate and regular rhythm.     Pulses: Normal pulses.     Heart sounds: Normal heart sounds. No murmur heard.    No friction rub. No gallop.  Pulmonary:     Effort: Pulmonary effort is normal.     Breath sounds: Normal breath sounds.  Abdominal:     General: Abdomen is flat. There is no distension.     Palpations: Abdomen is soft.     Tenderness: There is no abdominal tenderness. There is no guarding.  Musculoskeletal:        General: Tenderness present.     Comments: (L) hip tenderness to light palpation  Skin:    General: Skin is warm and dry.     Capillary Refill: Capillary refill takes less than 2 seconds.  Neurological:     General: No focal deficit present.     Mental Status: She is alert and oriented to person, place, and time.  Psychiatric:        Mood and Affect: Mood normal.        Behavior: Behavior normal.     Labs on Admission: I have personally reviewed following labs and imaging studies  CBC: Recent Labs  Lab 09/14/24 0139  WBC 10.6*  HGB 13.2  HCT 40.2  MCV 96.2  PLT 367   Basic Metabolic Panel: Recent Labs  Lab 09/14/24 0139  NA 138  K 3.9  CL 100  CO2 29  GLUCOSE 108*  BUN 14  CREATININE 0.63  CALCIUM  10.0   GFR: Estimated Creatinine Clearance: 52.3 mL/min (by C-G formula based on SCr of 0.63 mg/dL). Liver Function Tests: Recent Labs  Lab 09/14/24 0139  AST 36  ALT 26  ALKPHOS 104  BILITOT 0.2  PROT 7.8  ALBUMIN 4.7   No results for input(s): LIPASE, AMYLASE in the last 168 hours. No results for input(s): AMMONIA in the last 168 hours. Coagulation Profile: No results for input(s): INR, PROTIME in the  last 168 hours. Cardiac Enzymes: No results for input(s): CKTOTAL, CKMB, CKMBINDEX, TROPONINI, TROPONINIHS in the last 168 hours. BNP (last 3 results) No results for input(s): BNP in the last 8760 hours. HbA1C: No results for input(s): HGBA1C in the last 72 hours. CBG: No results for input(s): GLUCAP in the last 168 hours. Lipid Profile: No results for input(s): CHOL, HDL, LDLCALC, TRIG, CHOLHDL, LDLDIRECT in the last 72 hours. Thyroid  Function Tests: No results for input(s): TSH, T4TOTAL, FREET4, T3FREE, THYROIDAB in the last 72 hours. Anemia Panel: No results for input(s): VITAMINB12, FOLATE, FERRITIN, TIBC, IRON, RETICCTPCT in the last 72 hours. Urine analysis:    Component Value Date/Time   COLORURINE YELLOW 10/11/2023 1224   APPEARANCEUR Cloudy (A) 10/11/2023 1224   LABSPEC 1.020 10/11/2023 1224   PHURINE 6.5 10/11/2023 1224   GLUCOSEU NEGATIVE 10/11/2023 1224   HGBUR NEGATIVE 10/11/2023 1224   BILIRUBINUR Negative 02/06/2024 1446   KETONESUR NEGATIVE 10/11/2023 1224   PROTEINUR Positive (A) 02/06/2024 1446   UROBILINOGEN 0.2 02/06/2024 1446   UROBILINOGEN 0.2 10/11/2023 1224   NITRITE Negative 02/06/2024 1446   NITRITE POSITIVE (A) 10/11/2023 1224   LEUKOCYTESUR Small (  1+) (A) 02/06/2024 1446   LEUKOCYTESUR MODERATE (A) 10/11/2023 1224    Radiological Exams on Admission: I have personally reviewed images DG CHEST PORT 1 VIEW Result Date: 09/14/2024 EXAM: 1 VIEW(S) XRAY OF THE CHEST 09/14/2024 05:30:50 AM COMPARISON: 11/08/2018 CLINICAL HISTORY: Closed left hip fracture, initial encounter (HCC); Preoperative testing FINDINGS: LUNGS AND PLEURA: No focal pulmonary opacity. No pleural effusion. No pneumothorax. HEART AND MEDIASTINUM: Aortic arch atherosclerosis. No acute abnormality of the cardiac and mediastinal silhouettes. BONES AND SOFT TISSUES: No acute osseous abnormality. IMPRESSION: 1. No acute process. Electronically  signed by: Waddell Calk MD 09/14/2024 05:34 AM EST RP Workstation: [id number]   DG HIP UNILAT WITH PELVIS 2-3 VIEWS LEFT Result Date: 09/14/2024 EXAM: 2 OR MORE VIEW(S) XRAY OF THE LEFT HIP 09/14/2024 02:56:00 AM COMPARISON: None available. CLINICAL HISTORY: Fall FINDINGS: BONES AND JOINTS: Acute fracture of the subcapital left femoral neck. Superior displacement of the distal fracture fragment. SOFT TISSUES: The soft tissues are unremarkable. IMPRESSION: 1. Acute displaced  subcapital left femoral neck fracture. Electronically signed by: Norman Gatlin MD 09/14/2024 03:02 AM EST RP Workstation: [id number]    EKG: My personal interpretation of EKG shows: Normal sinus rhythm heart rate 89    Assessment/Plan Principal Problem:   Closed subcapital fracture of neck of femur, left, initial encounter (HCC) Active Problems:   Closed left hip fracture, initial encounter (HCC)   Essential hypertension   Hyperlipidemia   Tobacco dependence   Chronic obstructive pulmonary disease (COPD) (HCC)   Anxiety with depression   ADHD    Assessment and Plan: Closed left hip fracture, initial encounter (HCC) - Ortho consulted by EDP. Dr Jerri requesting admit to [facility] for operative repair today. Appreciate Orthopedics consultation and management - Pain control  - PT/OT evaluation post-op    Essential hypertension - Continue home clonidine , hydrochlorothiazide , and losartan   Hyperlipidemia - Continue home rosuvastatin   Chronic obstructive pulmonary disease (COPD) (HCC) As needed albuterol  Tobacco dependence Patient reports smoking 0.5 packs a day - Patient counseled on importance of continued cessation - NRT while inpatient with nicotine  patch and as needed nicotine  gum   Anxiety with depression - Continue home Trintellix    ADHD PDMP reviewed - Continue home Intuniv  and Adderall    VTE prophylaxis:  SCDs  GI prophylaxis: Not indicated  Diet: N.p.o. pending OR  Access: PIV Lines:  None Code Status:  Full Code Telemetry: No  Admission status: Inpatient, Med-Surg Patient is from: Home Anticipated d/c is to: Home Anticipated d/c date is: 3-4 days Patient currently: Awaiting operative repair of left hip fracture  Family Communication:  Husband Franky called at [phone] no answer, unable to leave voicemail  Consults called: Dr. Jerri with Ortho consulted by EDP   Severity of Illness: The appropriate patient status for this patient is INPATIENT. Inpatient status is judged to be reasonable and necessary in order to provide the required intensity of service to ensure the patient's safety. The patient's presenting symptoms, physical exam findings, and initial radiographic and laboratory data in the context of their chronic comorbidities is felt to place them at high risk for further clinical deterioration. Furthermore, it is not anticipated that the patient will be medically stable for discharge from the hospital within 2 midnights of admission.   * I certify that at the point of admission it is my clinical judgment that the patient will require inpatient hospital care spanning beyond 2 midnights from the point of admission due to high intensity of service, high risk for further  deterioration and high frequency of surveillance required.*  To reach the provider On-Call:   7AM- 7PM see care teams to locate the attending and reach out to them via [email or web address] Password: [email or web address] 7PM-7AM contact night-coverage If you still have difficulty reaching the appropriate provider, please page the [facility] (Director on Call) for [facility] on amion for assistance  This document was prepared using Conservation officer, historic buildings and may include unintentional dictation errors.  Rockie Rams FNP-BC, PMHNP-BC Nurse Practitioner [facility] [place]      [1]  Allergies Allergen Reactions   Lisinopril  Cough    Mild cough after a month    "

## 2024-09-14 NOTE — Consult Note (Signed)
 "  ORTHOPAEDIC CONSULTATION  REQUESTING PHYSICIAN: Zella Katha HERO, MD  Chief Complaint: Left hip fracture  HPI: Jasmine Nelson is a 68 y.o. year old female with past medical history of hypertension, hyperlipidemia, COPD, tobacco use disorder, anxiety, ADHD, and insomnia. She presents to [facility] ED after a mechanical fall onto her patio last night resulting in inability to ambulate with severe left hip pain. Jasmine Nelson reports that as she was walking her patio she inadvertently stepped out of one of her slippers causing her to lose her footing and ultimately falling onto her left hip. She does endorse having 1 mixed drink prior to the fall does endorse alcohol  use tonight prior. She denies loss of consciousness or head strike  Ortho consulted for surgical evaluation.  Past Medical History:  Diagnosis Date   Allergy    Anxiety    Cataract    both eyes   Chicken pox    COPD (chronic obstructive pulmonary disease) (HCC)    Depression    Glaucoma    Hx of thyroid  disease    3rd grade  resolved    Hypertension    Ingestion of substance    3 04   Osteoporosis    Shift work sleep disorder    Past Surgical History:  Procedure Laterality Date   ADENOIDECTOMY  1959   BREAST BIOPSY Right 2019   COLONOSCOPY     Social History   Socioeconomic History   Marital status: Married    Spouse name: Not on file   Number of children: Not on file   Years of education: Not on file   Highest education level: Bachelor's degree (e.g., BA, AB, BS)  Occupational History   Not on file  Tobacco Use   Smoking status: Every Day    Current packs/day: 0.50    Types: Cigarettes   Smokeless tobacco: Never  Vaping Use   Vaping status: Some Days  Substance and Sexual Activity   Alcohol  use: Yes    Comment: occasional wine   Drug use: No   Sexual activity: Not on file  Other Topics Concern   Not on file  Social History Narrative   Averages 5-8 hours of sleep per day.   Works third shift   2  people living in the home   3 dogs and 1 cat in the home   Radiology tech bs degree  [employer]   Married    1ppd   Stopped etoh   Seatbelts fa stored safely.   P0G0      Social Drivers of Health   Tobacco Use: High Risk (09/14/2024)   Patient History    Smoking Tobacco Use: Every Day    Smokeless Tobacco Use: Never    Passive Exposure: Not on file  Financial Resource Strain: Low Risk (08/08/2024)   Overall Financial Resource Strain (CARDIA)    Difficulty of Paying Living Expenses: Not hard at all  Food Insecurity: No Food Insecurity (08/08/2024)   Epic    Worried About Programme Researcher, Broadcasting/film/video in the Last Year: Never true    Ran Out of Food in the Last Year: Never true  Transportation Needs: No Transportation Needs (08/08/2024)   Epic    Lack of Transportation (Medical): No    Lack of Transportation (Non-Medical): No  Physical Activity: Inactive (08/08/2024)   Exercise Vital Sign    Days of Exercise per Week: 0 days    Minutes of Exercise per Session: Not on file  Stress:  Stress Concern Present (08/08/2024)   [employer] of Occupational Health - Occupational Stress Questionnaire    Feeling of Stress: To some extent  Social Connections: Socially Isolated (08/08/2024)   Social Connection and Isolation Panel    Frequency of Communication with Friends and Family: Once a week    Frequency of Social Gatherings with Friends and Family: Never    Attends Religious Services: Never    Database Administrator or Organizations: No    Attends Engineer, Structural: Not on file    Marital Status: Married  Depression (PHQ2-9): Low Risk (06/09/2024)   Depression (PHQ2-9)    PHQ-2 Score: 0  Alcohol  Screen: Low Risk (08/08/2024)   Alcohol  Screen    Last Alcohol  Screening Score (AUDIT): 4  Housing: Low Risk (08/08/2024)   Epic    Unable to Pay for Housing in the Last Year: No    Number of Times Moved in the Last Year: 0    Homeless in the Last Year: No  Utilities: Not At  Risk (06/09/2024)   Epic    Threatened with loss of utilities: No  Health Literacy: Adequate Health Literacy (06/09/2024)   B1300 Health Literacy    Frequency of need for help with medical instructions: Never   Family History  Problem Relation Age of Onset   Cancer Mother 49       Died at 54 of Non-Hodgkins Lymphoma   Lymphoma Mother        37   Colon polyps Mother        many; required regular colonoscopies   Alcohol  abuse Father    COPD Father    Depression Father    Hyperlipidemia Father    Hypertension Father        deceased   Colon polyps Father        many; required regular colonoscopies   Colon cancer Sister        dx. 21-56   Other Sister        +CHEK2 mutation   COPD Maternal Aunt    Ovarian cancer Maternal Aunt 62   Stroke Maternal Grandmother    Heart Problems Maternal Grandmother    Cancer Maternal Grandfather 72       Brain Cancer   Arthritis Paternal Grandmother    Depression Paternal Grandmother    Glaucoma Paternal Grandmother    Breast cancer Paternal Grandmother        dx. 50s   Uterine cancer Paternal Grandmother        dx. 60s   Renal cancer Paternal Grandfather        smoker   Multiple myeloma Maternal Uncle        dx. well before his 26s   Ovarian cancer Other    Esophageal cancer Neg Hx    Stomach cancer Neg Hx    Rectal cancer Neg Hx    Allergies[1] Prior to Admission medications  Medication Sig Start Date End Date Taking? Authorizing Provider  Abaloparatide  (TYMLOS ) 3120 MCG/1.56ML SOPN Inject 80 mcg into the skin daily. 06/04/24   Joane Artist RAMAN, MD  Alcohol  Swabs  [facility] ALCOHOL  PREP MEDIUM) 70 % PADS Use daily for Tymlos  injection 06/05/24   Corey, Evan S, MD  amphetamine -dextroamphetamine  (ADDERALL) 20 MG tablet Take 1 tablet (20 mg total) by mouth 2 (two) times daily. 09/12/24     Ca Phosphate-Cholecalciferol (CALCIUM  WITH D3 PO) Take by mouth.    [provider]  clobetasol  cream (TEMOVATE ) 0.05 % Apply 1 Application  topically 2 (two) times daily. 04/18/23     cloNIDine  (CATAPRES ) 0.1 MG tablet Take 2-3 tablets (0.2-0.3 mg total) by mouth at bedtime. 03/24/24     cycloSPORINE , PF, 0.09 % SOLN Place 1 drop into both eyes 2 (two) times daily. 08/26/24     gabapentin  (NEURONTIN ) 400 MG capsule Take 6 capsules (2,400 mg total) by mouth at bedtime. (09-05-24) 09/05/24     guanFACINE  (INTUNIV ) 1 MG TB24 ER tablet Take 1 tablet (1 mg total) by mouth every morning. 08/26/24     hydrochlorothiazide  (HYDRODIURIL ) 12.5 MG tablet Take 1 tablet (12.5 mg total) by mouth daily. Please schedule appt for further refills 07/02/24   Panosh, Wanda K, MD  Insulin  Pen Needle (PEN NEEDLES) 31G X 5 MM MISC Use with Tymlos  daily 06/05/24   Corey, Evan S, MD  losartan  (COZAAR ) 100 MG tablet Take 1 tablet (100 mg total) by mouth daily. 03/25/24 03/25/25  Panosh, Wanda K, MD  Probiotic Product (ALIGN PO) Take by mouth.    [provider]  rosuvastatin  (CRESTOR ) 5 MG tablet Take 1 tablet (5 mg total) by mouth daily. 04/16/24   Panosh, Wanda K, MD  sharps container Use as needed 06/05/24   Corey, Evan S, MD  Suvorexant  (BELSOMRA ) 20 MG TABS Take 1 tablet (20 mg total) by mouth at bedtime. 03/16/24 03/16/24     Suvorexant  (BELSOMRA ) 20 MG TABS Take 1 tablet (20 mg total) by mouth at bedtime. 06/24/24     vortioxetine  HBr (TRINTELLIX ) 20 MG TABS tablet Take 1 tablet (20 mg total) by mouth daily. 09/08/23     vortioxetine  HBr (TRINTELLIX ) 20 MG TABS tablet Take 1 tablet (20 mg total) by mouth daily. 07/01/24      DG CHEST PORT 1 VIEW Result Date: 09/14/2024 EXAM: 1 VIEW(S) XRAY OF THE CHEST 09/14/2024 05:30:50 AM COMPARISON: 11/08/2018 CLINICAL HISTORY: Closed left hip fracture, initial encounter (HCC); Preoperative testing FINDINGS: LUNGS AND PLEURA: No focal pulmonary opacity. No pleural effusion. No pneumothorax. HEART AND MEDIASTINUM: Aortic arch atherosclerosis. No acute abnormality of the cardiac and mediastinal silhouettes. BONES AND SOFT  TISSUES: No acute osseous abnormality. IMPRESSION: 1. No acute process. Electronically signed by: Waddell Calk MD 09/14/2024 05:34 AM EST RP Workstation: [id number]   DG HIP UNILAT WITH PELVIS 2-3 VIEWS LEFT Result Date: 09/14/2024 EXAM: 2 OR MORE VIEW(S) XRAY OF THE LEFT HIP 09/14/2024 02:56:00 AM COMPARISON: None available. CLINICAL HISTORY: Fall FINDINGS: BONES AND JOINTS: Acute fracture of the subcapital left femoral neck. Superior displacement of the distal fracture fragment. SOFT TISSUES: The soft tissues are unremarkable. IMPRESSION: 1. Acute displaced  subcapital left femoral neck fracture. Electronically signed by: Norman Gatlin MD 09/14/2024 03:02 AM EST RP Workstation: [id number]    All pertinent xrays, MRI, CT independently reviewed and interpreted  Positive ROS: All other systems have been reviewed and were otherwise negative with the exception of those mentioned in the HPI and as above.  Physical Exam: General: No acute distress Cardiovascular: No pedal edema Respiratory: No cyanosis, no use of accessory musculature GI: No organomegaly, abdomen is soft and non-tender Skin: No lesions in the area of chief complaint Neurologic: Sensation intact distally Psychiatric: Patient is at baseline mood and affect Lymphatic: No axillary or cervical lymphadenopathy  MUSCULOSKELETAL:  - severe pain with movement of the hip and extremity - skin intact - NVI distally - compartments soft  Assessment: Left femoral neck fracture  Plan: - surgical treatment is recommended for pain relief, quality of life and early mobilization -  patient and family are aware of r/b/a and wish to proceed, informed consent obtained - medical optimization per primary team - surgery is planned for today  Thank you for the consult and the opportunity to see Jasmine Nelson Ozell Cummins, MD [facility] 9:22 AM         [1]  Allergies Allergen Reactions   Lisinopril  Cough    Mild cough  after a month    "

## 2024-09-14 NOTE — Assessment & Plan Note (Addendum)
 Continue home Trintellix

## 2024-09-14 NOTE — H&P (Signed)

## 2024-09-14 NOTE — Progress Notes (Signed)
 Notified RN and Dr. Roxane that surgery will be performed this morning.  Continue NPO.  Will see patient for full consult shortly.

## 2024-09-14 NOTE — ED Provider Notes (Signed)
 " [place] EMERGENCY DEPARTMENT AT [facility] Provider Note   CSN: [id number] Arrival date & time: 09/14/24  0119     Patient presents with: Fall and Hip Pain   Jasmine Nelson is a 68 y.o. female with history of generalized anxiety disorder, COPD, hypertension, not anticoagulated who presents after mechanical fall onto her patio tonight resulting in inability to ambulate with severe left hip pain.  She states that she lost one of her slippers and she tripped on prompting the fall.  Does endorse alcohol  use tonight prior to fall, with 1 mixed drink.  Denies hitting her head, LOC, nausea, vomiting, blurry double vision.  Pain on the left hip now.  No numbness or tingling down the leg.   HPI     Prior to Admission medications  Medication Sig Start Date End Date Taking? Authorizing Provider  Abaloparatide  (TYMLOS ) 3120 MCG/1.56ML SOPN Inject 80 mcg into the skin daily. 06/04/24   Joane Artist RAMAN, MD  Alcohol  Swabs  [facility] ALCOHOL  PREP MEDIUM) 70 % PADS Use daily for Tymlos  injection 06/05/24   Corey, Evan S, MD  amphetamine -dextroamphetamine  (ADDERALL) 20 MG tablet Take 1 tablet (20 mg total) by mouth 2 (two) times daily. 09/12/24     Ca Phosphate-Cholecalciferol (CALCIUM  WITH D3 PO) Take by mouth.    [provider]  clobetasol  cream (TEMOVATE ) 0.05 % Apply 1 Application topically 2 (two) times daily. 04/18/23     cloNIDine  (CATAPRES ) 0.1 MG tablet Take 2-3 tablets (0.2-0.3 mg total) by mouth at bedtime. 03/24/24     cycloSPORINE , PF, 0.09 % SOLN Place 1 drop into both eyes 2 (two) times daily. 08/26/24     gabapentin  (NEURONTIN ) 400 MG capsule Take 6 capsules (2,400 mg total) by mouth at bedtime. (09-05-24) 09/05/24     guanFACINE  (INTUNIV ) 1 MG TB24 ER tablet Take 1 tablet (1 mg total) by mouth every morning. 08/26/24     hydrochlorothiazide  (HYDRODIURIL ) 12.5 MG tablet Take 1 tablet (12.5 mg total) by mouth daily. Please schedule appt for further refills 07/02/24   Panosh,  Wanda K, MD  Insulin  Pen Needle (PEN NEEDLES) 31G X 5 MM MISC Use with Tymlos  daily 06/05/24   Corey, Evan S, MD  losartan  (COZAAR ) 100 MG tablet Take 1 tablet (100 mg total) by mouth daily. 03/25/24 03/25/25  Panosh, Wanda K, MD  Probiotic Product (ALIGN PO) Take by mouth.    [provider]  rosuvastatin  (CRESTOR ) 5 MG tablet Take 1 tablet (5 mg total) by mouth daily. 04/16/24   Panosh, Wanda K, MD  sharps container Use as needed 06/05/24   Corey, Evan S, MD  Suvorexant  (BELSOMRA ) 20 MG TABS Take 1 tablet (20 mg total) by mouth at bedtime. 03/16/24 03/16/24     Suvorexant  (BELSOMRA ) 20 MG TABS Take 1 tablet (20 mg total) by mouth at bedtime. 06/24/24     vortioxetine  HBr (TRINTELLIX ) 20 MG TABS tablet Take 1 tablet (20 mg total) by mouth daily. 09/08/23     vortioxetine  HBr (TRINTELLIX ) 20 MG TABS tablet Take 1 tablet (20 mg total) by mouth daily. 07/01/24       Allergies: Lisinopril     Review of Systems  Musculoskeletal:        L hip pain    Updated Vital Signs BP (!) 142/78   Pulse 91   Temp 98 F (36.7 C)   Resp 17   Ht 5' 5 (1.651 m)   Wt 49.9 kg   SpO2 90%  BMI 18.30 kg/m   Physical Exam Vitals and nursing note reviewed.  Constitutional:      Appearance: She is not ill-appearing or toxic-appearing.  HENT:     Head: Normocephalic and atraumatic.     Mouth/Throat:     Mouth: Mucous membranes are moist.     Pharynx: No oropharyngeal exudate or posterior oropharyngeal erythema.  Eyes:     General:        Right eye: No discharge.        Left eye: No discharge.     Conjunctiva/sclera: Conjunctivae normal.  Cardiovascular:     Rate and Rhythm: Normal rate and regular rhythm.     Pulses: Normal pulses.     Heart sounds: Normal heart sounds. No murmur heard. Pulmonary:     Effort: Pulmonary effort is normal. No respiratory distress.     Breath sounds: Normal breath sounds. No wheezing or rales.  Abdominal:     General: Bowel sounds are normal. There is no  distension.     Palpations: Abdomen is soft.     Tenderness: There is no abdominal tenderness. There is no guarding or rebound.  Musculoskeletal:        General: No deformity.     Cervical back: Neck supple.     Right hip: Normal.     Left hip: Tenderness and bony tenderness present. No deformity or crepitus. Decreased range of motion. Normal strength.     Right upper leg: Normal.     Left upper leg: Normal.     Right knee: Normal.     Left knee: Normal.     Right lower leg: No edema.     Left lower leg: Normal. No edema.     Right ankle: Normal.     Right Achilles Tendon: Normal.     Left ankle: Normal.     Left Achilles Tendon: Normal.     Right foot: Normal.     Left foot: Normal.       Legs:  Skin:    General: Skin is warm and dry.     Capillary Refill: Capillary refill takes less than 2 seconds.  Neurological:     General: No focal deficit present.     Mental Status: She is alert and oriented to person, place, and time. Mental status is at baseline.  Psychiatric:        Mood and Affect: Mood normal.     (all labs ordered are listed, but only abnormal results are displayed) Labs Reviewed  CBC - Abnormal; Notable for the following components:      Result Value   WBC 10.6 (*)    All other components within normal limits  COMPREHENSIVE METABOLIC PANEL WITH GFR - Abnormal; Notable for the following components:   Glucose, Bld 108 (*)    All other components within normal limits    EKG: None  Radiology: DG CHEST PORT 1 VIEW Result Date: 09/14/2024 EXAM: 1 VIEW(S) XRAY OF THE CHEST 09/14/2024 05:30:50 AM COMPARISON: 11/08/2018 CLINICAL HISTORY: Closed left hip fracture, initial encounter (HCC); Preoperative testing FINDINGS: LUNGS AND PLEURA: No focal pulmonary opacity. No pleural effusion. No pneumothorax. HEART AND MEDIASTINUM: Aortic arch atherosclerosis. No acute abnormality of the cardiac and mediastinal silhouettes. BONES AND SOFT TISSUES: No acute osseous  abnormality. IMPRESSION: 1. No acute process. Electronically signed by: Waddell Calk MD 09/14/2024 05:34 AM EST RP Workstation: [id number]   DG HIP UNILAT WITH PELVIS 2-3 VIEWS LEFT Result Date: 09/14/2024 EXAM: 2 OR MORE  VIEW(S) XRAY OF THE LEFT HIP 09/14/2024 02:56:00 AM COMPARISON: None available. CLINICAL HISTORY: Fall FINDINGS: BONES AND JOINTS: Acute fracture of the subcapital left femoral neck. Superior displacement of the distal fracture fragment. SOFT TISSUES: The soft tissues are unremarkable. IMPRESSION: 1. Acute displaced  subcapital left femoral neck fracture. Electronically signed by: Norman Gatlin MD 09/14/2024 03:02 AM EST RP Workstation: [id number]     Procedures   Medications Ordered in the ED  HYDROmorphone  (DILAUDID ) injection 0.5 mg (has no administration in time range)  ondansetron  (ZOFRAN ) injection 4 mg (has no administration in time range)  fentaNYL  (SUBLIMAZE ) injection 100 mcg (100 mcg Intravenous Given 09/14/24 0139)  fentaNYL  (SUBLIMAZE ) injection 50 mcg (50 mcg Intravenous Given 09/14/24 0220)  HYDROmorphone  (DILAUDID ) injection 1 mg (1 mg Intravenous Given 09/14/24 0253)  ondansetron  (ZOFRAN ) injection 4 mg (4 mg Intravenous Given 09/14/24 0253)  HYDROmorphone  (DILAUDID ) injection 0.5 mg (0.5 mg Intravenous Given 09/14/24 0358)  HYDROmorphone  (DILAUDID ) injection 0.5 mg (0.5 mg Intravenous Given 09/14/24 0500)  bupivacaine  (MARCAINE ) 0.5 % (with pres) injection 50 mL (50 mLs Infiltration Given 09/14/24 0543)  lidocaine  (PF) (XYLOCAINE ) 1 % injection 30 mL (30 mLs Other Given 09/14/24 0543)    Clinical Course as of 09/14/24 0608  Sun Sep 14, 2024  0447 Consult to Dr. Jerri, ortho, who will loook at the OR schedules and call me back to recommend admission to [facility] vs [facility]. I appreciate his collaboration in the care of this patient.  [RS]  9549 Dr. Jerri requesting admission to [facility] due to OR availability.  I appreciate his collaboration care of this  patient. [RS]  [phone] Consult to Dr. Charlton, hospital medicine, who is amenable to plan for admission to [facility]. I appreciate his collaboration in the care of this patient.  [RS]  0557 L sided Fascia iliaca block performed by EDP DR. Mannie; please see his associated procedure note.  [RS]    Clinical Course User Index [RS] Bayron Dalto, Pleasant SAUNDERS, PA-C                                 Medical Decision Making 68 year old female presents with concern for left hip pain after mechanical fall.  Hypertension intake, vital signs otherwise normal.  Cardiopulmonary exam unremarkable, abdominal exam is benign.  Amount and/or Complexity of Data Reviewed Labs: ordered.    Details: CBC with mild leukocytosis of 10.6, CMP unremarkable Radiology: ordered.    Details:  chest x-ray is negative, plain films of the left hip and pelvis with acute nondisplaced subcapital left femoral neck fracture.    Risk Prescription drug management. Decision regarding hospitalization.   Remains hemodynamically stable with palpable DP pulses bilaterally in the lower extremities. No consults to both Ortho and hospital medicine as above.  Nerve block placed in the emergency department with improvement of patient's symptomatology.  Pending transport to [facility] for admission at time of shift change.  Quantina voiced understanding of her medical evaluation and treatment plan. Each of their questions answered to their expressed satisfaction.  This chart was dictated using voice recognition software, Dragon. Despite the best efforts of this provider to proofread and correct errors, errors may still occur which can change documentation meaning.     Final diagnoses:  Closed fracture of left hip, initial encounter [facility])    ED Discharge Orders     None          Laci Frenkel R,  PA-C 09/14/24 0609    Mannie Pac T, DO 09/14/24 [phone]  "

## 2024-09-14 NOTE — Plan of Care (Signed)
" °  Problem: Education: Goal: Knowledge of General Education information will improve Description: Including pain rating scale, medication(s)/side effects and non-pharmacologic comfort measures Outcome: Progressing   Problem: Elimination: Goal: Will not experience complications related to urinary retention Outcome: Progressing   Problem: Pain Management: Goal: Pain level will decrease Outcome: Progressing   "

## 2024-09-14 NOTE — ED Provider Notes (Signed)
" °  [place] EMERGENCY DEPARTMENT AT [facility] Provider Note   CSN: [id number] Arrival date & time: 09/14/24  0119   .Nerve Block  Date/Time: 09/14/2024 6:08 AM  Performed by: Mannie Fairy DASEN, DO Authorized by: Mannie Fairy DASEN, DO   Consent:    Consent obtained:  Verbal   Consent given by:  Patient   Risks, benefits, and alternatives were discussed: yes     Risks discussed:  Infection, bleeding, intravenous injection, unsuccessful block and pain   Alternatives discussed:  Alternative treatment Universal protocol:    Immediately prior to procedure, a time out was called: yes     Patient identity confirmed:  Verbally with patient Indications:    Indications:  Pain relief Location:    Body area:  Lower extremity   Lower extremity nerve:  Fascia iliaca   Laterality:  Left Pre-procedure details:    Skin preparation:  Chlorhexidine Skin anesthesia:    Skin anesthesia method:  Local infiltration   Local anesthetic:  5 mL lidocaine  (PF) 1 % Procedure details:    Block needle gauge:  20 G   Guidance: ultrasound     Anesthetic injected:  20 mL bupivacaine  0.5 %   Steroid injected:  None   Injection procedure:  Anatomic landmarks identified, incremental injection, negative aspiration for blood, anatomic landmarks palpated and introduced needle   Paresthesia:  None Post-procedure details:    Dressing:  Sterile dressing        Mannie Fairy T, DO 09/14/24 0609  "

## 2024-09-14 NOTE — Plan of Care (Signed)
   Problem: Coping: Goal: Level of anxiety will decrease Outcome: Progressing   Problem: Pain Managment: Goal: General experience of comfort will improve and/or be controlled Outcome: Progressing   Problem: Safety: Goal: Ability to remain free from injury will improve Outcome: Progressing

## 2024-09-14 NOTE — Assessment & Plan Note (Signed)
-   Continue home clonidine , hydrochlorothiazide , and losartan

## 2024-09-14 NOTE — Anesthesia Postprocedure Evaluation (Signed)
"   Anesthesia Post Note  Patient: Jasmine Nelson  Procedure(s) Performed: ARTHROPLASTY, HIP, TOTAL, ANTERIOR APPROACH (Left: Hip)     Patient location during evaluation: PACU Anesthesia Type: General Level of consciousness: awake Pain management: pain level controlled Vital Signs Assessment: post-procedure vital signs reviewed and stable Respiratory status: spontaneous breathing, nonlabored ventilation and respiratory function stable Cardiovascular status: blood pressure returned to baseline and stable Postop Assessment: no apparent nausea or vomiting Anesthetic complications: no   No notable events documented.                  Delon Aisha Arch      "

## 2024-09-14 NOTE — Assessment & Plan Note (Signed)
 Continue home rosuvastatin

## 2024-09-14 NOTE — Hospital Course (Addendum)
 Brief Narrative:   68 year old with history of HTN, HLD, COPD, tobacco use, anxiety, ADHD, insomnia presented to [facility], ED after mechanical fall on her patio sustaining left hip fracture.  Orthopedic was consulted who performed left-sided total hip replacement on 12/21.  PT recommending CIR, consulted their service.  Patient prefers to go home with home health services.  Appropriate arrangements were made  Assessment & Plan:  Closed left hip fracture, initial encounter [facility]) -Status post total left hip replacement on 12/21.  Postop management including DVT prophylaxis, follow-up, pain control, wound care and weightbearing prophylaxis per their service.  Eliquis  and pain medications were prescribed by orthopedic team.  Bowel regimen was also prescribed by me.  Leukocytosis - Suspect reactive   Essential hypertension, Stable - Continue home clonidine , hydrochlorothiazide , and losartan .    Hyperlipidemia - Continue home rosuvastatin    Chronic obstructive pulmonary disease (COPD) (HCC) As needed albuterol   Tobacco dependence -Counseled to quit using it     Anxiety with depression - Continue home Trintellix     ADHD - Continue home Intuniv  and Adderall   DVT prophylaxis: Eliquis     Code Status: Full Code Family Communication:   Status is: Inpatient Remains inpatient appropriate because: Discharge home today   PT Follow up Recs: Home Health Pt12/23/2025 1259  Subjective: Feeling well awaiting a ride for discharge No complaints Examination:  General exam: Appears calm and comfortable  Respiratory system: Clear to auscultation. Respiratory effort normal. Cardiovascular system: S1 & S2 heard, RRR. No JVD, murmurs, rubs, gallops or clicks. No pedal edema. Gastrointestinal system: Abdomen is nondistended, soft and nontender. No organomegaly or masses felt. Normal bowel sounds heard. Central nervous system: Alert and oriented. No focal neurological deficits. Extremities:  Symmetric 5 x 5 power. Skin: Left hip dressing in place Psychiatry: Judgement and insight appear normal. Mood & affect appropriate.

## 2024-09-14 NOTE — Assessment & Plan Note (Addendum)
 PDMP reviewed - Continue home Intuniv  and Adderall

## 2024-09-15 ENCOUNTER — Inpatient Hospital Stay (HOSPITAL_COMMUNITY)

## 2024-09-15 ENCOUNTER — Other Ambulatory Visit (HOSPITAL_COMMUNITY): Payer: Self-pay

## 2024-09-15 ENCOUNTER — Telehealth (HOSPITAL_COMMUNITY): Payer: Self-pay

## 2024-09-15 DIAGNOSIS — S72012A Unspecified intracapsular fracture of left femur, initial encounter for closed fracture: Secondary | ICD-10-CM | POA: Diagnosis not present

## 2024-09-15 DIAGNOSIS — E43 Unspecified severe protein-calorie malnutrition: Secondary | ICD-10-CM | POA: Insufficient documentation

## 2024-09-15 LAB — CBC
HCT: 27.7 % — ABNORMAL LOW (ref 36.0–46.0)
Hemoglobin: 9.4 g/dL — ABNORMAL LOW (ref 12.0–15.0)
MCH: 32.3 pg (ref 26.0–34.0)
MCHC: 33.9 g/dL (ref 30.0–36.0)
MCV: 95.2 fL (ref 80.0–100.0)
Platelets: 241 K/uL (ref 150–400)
RBC: 2.91 MIL/uL — ABNORMAL LOW (ref 3.87–5.11)
RDW: 13.1 % (ref 11.5–15.5)
WBC: 17.2 K/uL — ABNORMAL HIGH (ref 4.0–10.5)
nRBC: 0 % (ref 0.0–0.2)

## 2024-09-15 LAB — BASIC METABOLIC PANEL WITH GFR
Anion gap: 9 (ref 5–15)
BUN: 16 mg/dL (ref 8–23)
CO2: 28 mmol/L (ref 22–32)
Calcium: 8.4 mg/dL — ABNORMAL LOW (ref 8.9–10.3)
Chloride: 97 mmol/L — ABNORMAL LOW (ref 98–111)
Creatinine, Ser: 0.69 mg/dL (ref 0.44–1.00)
GFR, Estimated: >60 mL/min
Glucose, Bld: 117 mg/dL — ABNORMAL HIGH (ref 70–99)
Potassium: 3.7 mmol/L (ref 3.5–5.1)
Sodium: 134 mmol/L — ABNORMAL LOW (ref 135–145)

## 2024-09-15 LAB — HIV ANTIBODY (ROUTINE TESTING W REFLEX): HIV Screen 4th Generation wRfx: NONREACTIVE

## 2024-09-15 MED ORDER — HYDRALAZINE HCL 20 MG/ML IJ SOLN: 10.0000 mg | INTRAMUSCULAR | Status: DC | PRN

## 2024-09-15 MED ORDER — APIXABAN 2.5 MG PO TABS
2.5000 mg | ORAL_TABLET | Freq: Two times a day (BID) | ORAL | 0 refills | Status: AC
  Filled 2024-09-15: qty 60, 30d supply, fill #0

## 2024-09-15 MED ORDER — HYDROCODONE-ACETAMINOPHEN 5-325 MG PO TABS
1.0000 | ORAL_TABLET | Freq: Four times a day (QID) | ORAL | 0 refills | Status: DC | PRN
  Filled 2024-09-15: qty 30, 7d supply, fill #0

## 2024-09-15 MED ORDER — METOPROLOL TARTRATE 5 MG/5ML IV SOLN: 5.0000 mg | INTRAVENOUS | Status: DC | PRN

## 2024-09-15 MED ORDER — GLUCAGON HCL RDNA (DIAGNOSTIC) 1 MG IJ SOLR: 1.0000 mg | INTRAMUSCULAR | Status: DC | PRN

## 2024-09-15 MED ORDER — BOOST / RESOURCE BREEZE PO LIQD CUSTOM
1.0000 | Freq: Two times a day (BID) | ORAL | Status: DC
  Administered 2024-09-15 – 2024-09-17 (×3): 1 via ORAL

## 2024-09-15 MED ORDER — HYDROCODONE-ACETAMINOPHEN 5-325 MG PO TABS
1.0000 | ORAL_TABLET | Freq: Four times a day (QID) | ORAL | Status: DC | PRN
  Administered 2024-09-15 (×3): 1 via ORAL
  Administered 2024-09-16 – 2024-09-17 (×6): 2 via ORAL
  Filled 2024-09-15: qty 1
  Filled 2024-09-15 (×6): qty 2
  Filled 2024-09-15: qty 1
  Filled 2024-09-15: qty 2

## 2024-09-15 MED ORDER — ADULT MULTIVITAMIN W/MINERALS CH
1.0000 | ORAL_TABLET | Freq: Every day | ORAL | Status: DC
  Administered 2024-09-15 – 2024-09-17 (×3): 1 via ORAL
  Filled 2024-09-15 (×3): qty 1

## 2024-09-15 NOTE — Evaluation (Signed)
 Physical Therapy Evaluation Patient Details Name: Jasmine Nelson MRN: [id number] DOB: 1956-07-09 Today's Date: 09/15/2024  History of Present Illness  68 y.o. year old female presents to [facility], ED after a mechanical fall onto her patio last night resulting in inability to ambulate with severe left hip pain. Dx of fracture of neck of femur. s/p THA-AA 09/14/24 with fascia ilaica block.. Pt with past medical history of hypertension, hyperlipidemia, COPD, tobacco use disorder, anxiety, ADHD, and insomnia.  Clinical Impression  Pt admitted with above diagnosis.  Pt currently with functional limitations due to the deficits listed below (see PT Problem List). Pt will benefit from acute skilled PT to increase their independence and safety with mobility to allow discharge.     The patient reports left thigh numbness.  Left leg buckled several times while ambulating. Noted post nerve block.  Patient currently requires assistance for safe ambulation. If L Leg becomes more functional and gait safe, Patient hopeful to DC home. Spouse does work and will be leaving on a trip. Patient's  sister may be available. Patient may require post acute rehab. PT will continue to assess for disposition .      If plan is discharge home, recommend the following: A little help with walking and/or transfers;A little help with bathing/dressing/bathroom;Assistance with cooking/housework;Help with stairs or ramp for entrance   Can travel by private vehicle        Equipment Recommendations Rolling walker (2 wheels)  Recommendations for Other Services       Functional Status Assessment Patient has had a recent decline in their functional status and demonstrates the ability to make significant improvements in function in a reasonable and predictable amount of time.     Precautions / Restrictions Precautions Precautions: Fall Precaution/Restrictions Comments: Left knee/hip buckled several times  while  ambulating Restrictions LLE Weight Bearing Per Provider Order: Weight bearing as tolerated      Mobility  Bed Mobility Overal bed mobility: Needs Assistance Bed Mobility: Supine to Sit, Sit to Supine     Supine to sit: Supervision, HOB elevated, Used rails Sit to supine: Supervision   General bed mobility comments: extra effort to place Left leg back onto bed    Transfers Overall transfer level: Needs assistance Equipment used: Rolling walker (2 wheels) Transfers: Sit to/from Stand Sit to Stand: Contact guard assist           General transfer comment: cues for hand  position    Ambulation/Gait Ambulation/Gait assistance: Min assist, Mod assist, +2 safety/equipment Gait Distance (Feet): 100 Feet Assistive device: Rolling walker (2 wheels) Gait Pattern/deviations: Step-to pattern, Step-through pattern Gait velocity: decr     General Gait Details: Left leg buckled x 3, required support to balance  and steady  Stairs            Wheelchair Mobility     Tilt Bed    Modified Rankin (Stroke Patients Only)       Balance   Sitting-balance support: No upper extremity supported, Feet supported Sitting balance-Leahy Scale: Fair     Standing balance support: Bilateral upper extremity supported, During functional activity Standing balance-Leahy Scale: Poor                               Pertinent Vitals/Pain Pain Assessment Faces Pain Scale: Hurts a little bit Pain Location: ribs Pain Descriptors / Indicators: Sore Pain Intervention(s): Monitored during session    Home Living Family/patient expects  to be discharged to:: Private residence Living Arrangements: Spouse/significant other Available Help at Discharge: Family;Available PRN/intermittently Type of Home: House Home Access: Stairs to enter Entrance Stairs-Rails: None Entrance Stairs-Number of Steps: 2-3  garage   Home Layout: One level Home Equipment: None Additional Comments:  sister could help some at DC. pt reports her spouse is going out of town on 12/29    Prior Function Prior Level of Function : Independent/Modified Independent             Mobility Comments: no AD for mobility ADLs Comments: Indep with ADLs, IADLs     Extremity/Trunk Assessment   Upper Extremity Assessment Upper Extremity Assessment: Overall WFL for tasks assessed    Lower Extremity Assessment Lower Extremity Assessment: LLE deficits/detail LLE Deficits / Details: hip flex 2/5, knee ext 4/5, reports  ant. thigh  feels numb    Cervical / Trunk Assessment Cervical / Trunk Assessment: Normal  Communication   Communication Communication: No apparent difficulties    Cognition Arousal: Alert Behavior During Therapy: WFL for tasks assessed/performed   PT - Cognitive impairments: No apparent impairments                       PT - Cognition Comments: somewhat  sleepy Following commands: Intact       Cueing Cueing Techniques: Verbal cues     General Comments General comments (skin integrity, edema, etc.): left leg buckled several times    Exercises     Assessment/Plan    PT Assessment Patient needs continued PT services  PT Problem List Decreased strength;Decreased range of motion;Decreased activity tolerance;Decreased balance;Decreased mobility;Decreased coordination;Pain;Decreased knowledge of use of DME;Decreased safety awareness;Decreased knowledge of precautions;Impaired sensation       PT Treatment Interventions DME instruction;Gait training;Stair training;Functional mobility training;Therapeutic activities;Therapeutic exercise;Balance training;Patient/family education    PT Goals (Current goals can be found in the Care Plan section)  Acute Rehab PT Goals Patient Stated Goal: to go home PT Goal Formulation: With patient Time For Goal Achievement: 09/22/24 Potential to Achieve Goals: Good    Frequency 7X/week     Co-evaluation PT/OT/SLP  Co-Evaluation/Treatment: Yes Reason for Co-Treatment: For patient/therapist safety;To address functional/ADL transfers PT goals addressed during session: Mobility/safety with mobility;Proper use of DME OT goals addressed during session: ADL's and self-care       AM-PAC PT 6 Clicks Mobility  Outcome Measure Help needed turning from your back to your side while in a flat bed without using bedrails?: None Help needed moving from lying on your back to sitting on the side of a flat bed without using bedrails?: A Little Help needed moving to and from a bed to a chair (including a wheelchair)?: A Little Help needed standing up from a chair using your arms (e.g., wheelchair or bedside chair)?: A Little Help needed to walk in hospital room?: A Lot Help needed climbing 3-5 steps with a railing? : A Lot 6 Click Score: 17    End of Session Equipment Utilized During Treatment: Gait belt Activity Tolerance: Patient tolerated treatment well Patient left: in bed;with call bell/phone within reach;with bed alarm set Nurse Communication: Mobility status PT Visit Diagnosis: Unsteadiness on feet (R26.81)    Time: 8786-8769 PT Time Calculation (min) (ACUTE ONLY): 17 min   Charges:   PT Evaluation $PT Eval Low Complexity: 1 Low   PT General Charges $$ ACUTE PT VISIT: 1 Visit         Darice Potters PT Acute Rehabilitation Services Office [phone]  Leigh Darice Norris 09/15/2024, 1:23 PM

## 2024-09-15 NOTE — Telephone Encounter (Signed)
 Pharmacy Patient Advocate Encounter  Insurance verification completed.    The patient is insured through [employer]/ [employer]. Patient has Medicare and is not eligible for a copay card, but may be able to apply for patient assistance or Medicare RX Payment Plan (Patient Must reach out to their plan, if eligible for payment plan), if available.    Ran test claim for Eliquis  5mg  tablet and the current 30 day co-pay is $0.   This test claim was processed through [employer]- copay amounts may vary at other pharmacies due to [employer], or as the patient moves through the different stages of their insurance plan.

## 2024-09-15 NOTE — Progress Notes (Signed)
" °   09/15/24 0940  What Happened  Was fall witnessed? Yes  Who witnessed fall? [place], NT, [place] NT, and [place], LPN  Patients activity before fall bathroom-assisted  Point of contact arm/shoulder;buttocks  Was patient injured? No  Provider Notification  Provider Name/Title Dr. Jerri and Dr. Caleen  Date Provider Notified 09/15/24  Time Provider Notified 0945  Method of Notification Call  Notification Reason Fall  Provider response See new orders  Date of Provider Response 09/15/24  Time of Provider Response 0950  Follow Up  Family notified No - patient refusal  Additional tests Yes-comment (xray)  Simple treatment Ice  Adult Fall Risk Assessment  Risk Factor Category (scoring not indicated) High fall risk per protocol (document High fall risk)  Age 68  Fall History: Fall within 6 months prior to admission 5  Elimination; Bowel and/or Urine Incontinence 0  Elimination; Bowel and/or Urine Urgency/Frequency 0  Medications: includes PCA/Opiates, Anti-convulsants, Anti-hypertensives, Diuretics, Hypnotics, Laxatives, Sedatives, and Psychotropics 7  Patient Care Equipment 1  Mobility-Assistance 2  Mobility-Gait 2  Mobility-Sensory Deficit 0  Altered awareness of immediate physical environment 0  Impulsiveness 0  Lack of understanding of one's physical/cognitive limitations 0  Total Score 18  Patient Fall Risk Level High fall risk  Adult Fall Risk Interventions  Required Bundle Interventions *See Row Information* High fall risk  Additional Interventions Use of appropriate toileting equipment (bedpan, BSC, etc.);PT/OT need assessed if change in mobility from baseline  Fall intervention(s) refused/Patient educated regarding refusal Open door if unsupervised  Screening for Fall Injury Risk (To be completed on HIGH fall risk patients) - Assessing Need for Floor Mats  Risk For Fall Injury- Criteria for Floor Mats Admitted as a result of a fall  Will Implement Floor Mats Yes  Pain  Assessment  Pain Scale 0-10  Pain Score 2  Pain Type Surgical pain  Pain Location Hip  Pain Orientation Left  Pain Descriptors / Indicators Aching;Discomfort  Pain Frequency Intermittent  Pain Onset With Activity  Patients Stated Pain Goal 2  Pain Intervention(s) Hot/Cold interventions  Multiple Pain Sites No  Hot/Cold Interventions  Hot/Cold Interventions Ice Pack  PCA/Epidural/Spinal Assessment  Respiratory Pattern Regular;Unlabored  Neurological  Neuro (WDL) X  Level of Consciousness Alert  Orientation Level Oriented X4  Cognition Appropriate at baseline  Speech Clear  Motor Function/Sensation Assessment Dorsiflexion;Plantar flexion  R Foot Dorsiflexion Moderate  L Foot Dorsiflexion Moderate  R Foot Plantar Flexion Moderate  L Foot Plantar Flexion Moderate  RLE Motor Response Purposeful movement  RLE Sensation Full sensation;No numbness;No pain;No tingling  RLE Motor Strength 5  LLE Motor Response Purposeful movement  LLE Sensation Decreased;No tingling;Pain  LLE Motor Strength 4  Neuro Symptoms Anxiety  Neuro symptoms relieved by Anti-anxiety medication;Rest  Musculoskeletal  Musculoskeletal (WDL) X  Assistive Device Front wheel walker;BSC  Generalized Weakness Yes  Weight Bearing Restrictions Per Provider Order Yes  LLE Weight Bearing Per Provider Order WBAT  Integumentary  Integumentary (WDL) X  Skin Color Appropriate for ethnicity  Skin Condition Dry  Skin Integrity Abrasion;Other (Comment) (Surgical incision)  Abrasion Location Ankle;Elbow  Abrasion Location Orientation Right;Left (Right elbow, Left ankle)  Skin Turgor Non-tenting   "

## 2024-09-15 NOTE — Progress Notes (Signed)
 OT Cancellation Note  Patient Details Name: Jasmine Nelson MRN: [id number] DOB: Sep 10, 1956   Cancelled Treatment:    Reason Eval/Treat Not Completed: Medical issues which prohibited therapy [name] this AM in bathroom; pending hip xray. Will hold OT eval until xray resulted.   [name] 09/15/2024, 10:41 AM

## 2024-09-15 NOTE — TOC Initial Note (Addendum)
 Transition of Care [facility]) - Initial/Assessment Note    Patient Details  Name: Jasmine Nelson MRN: [id number] Date of Birth: [birth year]  Transition of Care [facility] CM/SW Contact:    Heather DELENA Saltness, LCSW Phone Number: 09/15/2024, 12:45 PM  Clinical Narrative:                  ADDENDUM  PT recommending RW upon discharge. RW ordered through [facility], to be delivered to bedside. PT's recommendations still pending. TOC will continue to follow.  Pt admitted to the hospital after fall at home, resulting in left hip fracture. Left total hip arthroplasty performed on 12/21. Pt from home with spouse. PT evaluation pending. TOC will continue to follow and await recommendations.   Expected Discharge Plan: Home/Self Care Barriers to Discharge: Continued Medical Work up   Patient Goals and CMS Choice Patient states their goals for this hospitalization and ongoing recovery are:: To return home        Expected Discharge Plan and Services In-house Referral: Clinical Social Work Discharge Planning Services: NA Post Acute Care Choice: NA Living arrangements for the past 2 months: Single Family Home                 DME Arranged: N/A DME Agency: NA       HH Arranged: NA HH Agency: NA        Prior Living Arrangements/Services Living arrangements for the past 2 months: Single Family Home Lives with:: Self, Spouse Patient language and need for interpreter reviewed:: Yes Do you feel safe going back to the place where you live?: Yes      Need for Family Participation in Patient Care: Yes (Comment) Care giver support system in place?: No (comment)   Criminal Activity/Legal Involvement Pertinent to Current Situation/Hospitalization: No - Comment as needed  Activities of Daily Living   ADL Screening (condition at time of admission) Independently performs ADLs?: Yes (appropriate for developmental age) Is the patient deaf or have difficulty hearing?: No Does the patient have difficulty  seeing, even when wearing glasses/contacts?: No Does the patient have difficulty concentrating, remembering, or making decisions?: No  Permission Sought/Granted Permission sought to share information with : Family Supports Permission granted to share information with : Yes, Verbal Permission Granted  Share Information with NAME: Layna Roeper  Permission granted to share info w AGENCY: HH/SNF  Permission granted to share info w Relationship: Spouse  Permission granted to share info w Contact Information: [phone]  Emotional Assessment Appearance:: Appears stated age Attitude/Demeanor/Rapport: Unable to Assess Affect (typically observed): Unable to Assess Orientation: : Oriented to Self, Oriented to Place, Oriented to  Time, Oriented to Situation Alcohol  / Substance Use: Not Applicable Psych Involvement: No (comment)  Admission diagnosis:  Hip fracture (HCC) [S72.009A] Closed fracture of left hip, initial encounter (HCC) [S72.002A] Closed left hip fracture, initial encounter [facility]) [S72.002A] Patient Active Problem List   Diagnosis Date Noted   Closed left hip fracture, initial encounter (HCC) 09/14/2024   Hip fracture (HCC) 09/14/2024   Essential hypertension 09/14/2024   Hyperlipidemia 09/14/2024   Chronic obstructive pulmonary disease (COPD) (HCC) 09/14/2024   ADHD 09/14/2024   Closed subcapital fracture of neck of femur, left, initial encounter (HCC) 09/14/2024   Genetic testing 04/19/2015   Scalp lesion 11/09/2013   Glaucoma 10/31/2013   Abnormal Pap smear of cervix 10/31/2013   Tobacco dependence 10/31/2013   Infection of nose 10/31/2013   DERMATITIS, HANDS 07/23/2009   TOBACCO USE 03/09/2008   OTHER CIRCADIAN RHYTHM SLEEP  DISORDER 03/09/2008   Osteoporosis 03/09/2008   GENERALIZED ANXIETY DISORDER 12/17/2007   Anxiety with depression 12/17/2007   INSOMNIA 12/17/2007   PCP:  Charlett Apolinar POUR, MD Pharmacy:   [facility] [address] [place] [address] Phone: [phone] Fax: [phone]   Social Drivers of Health (SDOH) Social History: SDOH Screenings   Food Insecurity: No Food Insecurity (09/14/2024)  Housing: Low Risk (09/14/2024)  Transportation Needs: No Transportation Needs (09/14/2024)  Utilities: Not At Risk (09/14/2024)  Alcohol  Screen: Low Risk (08/08/2024)  Depression (PHQ2-9): Low Risk (06/09/2024)  Financial Resource Strain: Low Risk (08/08/2024)  Physical Activity: Inactive (08/08/2024)  Social Connections: Socially Isolated (09/14/2024)  Stress: Stress Concern Present (08/08/2024)  Tobacco Use: High Risk (09/14/2024)  Health Literacy: Adequate Health Literacy (06/09/2024)   SDOH Interventions: None     Readmission Risk Interventions    09/15/2024   12:41 PM  Readmission Risk Prevention Plan  Post Dischage Appt Complete  Medication Screening Complete  Transportation Screening Complete    Signed: Heather Saltness, MSW, LCSW Clinical Social Worker Inpatient Care Management 09/15/2024 12:49 PM

## 2024-09-15 NOTE — Evaluation (Signed)
 Occupational Therapy Evaluation Patient Details Name: Jasmine Nelson MRN: [id number] DOB: [birth year] Today's Date: 09/15/2024   History of Present Illness   68 y.o. year old female presents to [facility], ED after a mechanical fall onto her patio last night resulting in inability to ambulate with severe left hip pain. Dx of fracture of neck of femur. s/p THA-AA 09/14/24. Pt with past medical history of hypertension, hyperlipidemia, COPD, tobacco use disorder, anxiety, ADHD, and insomnia     Clinical Impressions PTA, pt lives with spouse who works full time. Pt reports typically being completely independent with ADLs, IADLs and mobility without AD. Pt presents now with deficits in LLE sensation (primarily around upper LE), LLE strength and overall balance. Due to LLE impairments, pt requires CGA-Min A for mobility using RW (+2 for safety d/t intermittent knee buckling with activity). Pt requires Setup for UB ADL and overall CGA for LB ADLs. Anticipate good improvements and decreased fall risk as LLE sensation/strength normalizes. Plan to see for additional OT session before finalizing DC recs. Hope to aim for safe DC home.     If plan is discharge home, recommend the following:   A little help with bathing/dressing/bathroom;A little help with walking and/or transfers;Assistance with cooking/housework;Assist for transportation;Help with stairs or ramp for entrance     Functional Status Assessment   Patient has had a recent decline in their functional status and demonstrates the ability to make significant improvements in function in a reasonable and predictable amount of time.     Equipment Recommendations   Other (comment);BSC/3in1 (RW)     Recommendations for Other Services         Precautions/Restrictions   Precautions Precautions: Fall Restrictions Weight Bearing Restrictions Per Provider Order: Yes LLE Weight Bearing Per Provider Order: Weight bearing as tolerated      Mobility Bed Mobility Overal bed mobility: Needs Assistance Bed Mobility: Supine to Sit, Sit to Supine     Supine to sit: Supervision, HOB elevated, Used rails Sit to supine: Supervision        Transfers Overall transfer level: Needs assistance Equipment used: Rolling walker (2 wheels) Transfers: Sit to/from Stand Sit to Stand: Contact guard assist                  Balance Overall balance assessment: Needs assistance Sitting-balance support: No upper extremity supported, Feet supported Sitting balance-Leahy Scale: Fair     Standing balance support: Bilateral upper extremity supported, During functional activity Standing balance-Leahy Scale: Poor                             ADL either performed or assessed with clinical judgement   ADL Overall ADL's : Needs assistance/impaired Eating/Feeding: Independent   Grooming: Contact guard assist;Standing   Upper Body Bathing: Set up;Sitting   Lower Body Bathing: Sitting/lateral leans;Sit to/from stand;Contact guard assist   Upper Body Dressing : Set up;Sitting   Lower Body Dressing: Contact guard assist;Sitting/lateral leans;Sit to/from stand   Toilet Transfer: Contact guard assist;Minimal assistance;Ambulation;Rolling walker (2 wheels)   Toileting- Clothing Manipulation and Hygiene: Contact guard assist;Sit to/from stand;Adhering to back precautions;Sitting/lateral lean               Vision Baseline Vision/History: 1 Wears glasses Ability to See in Adequate Light: 0 Adequate Patient Visual Report: No change from baseline Vision Assessment?: No apparent visual deficits     Perception         Praxis  Pertinent Vitals/Pain Pain Assessment Pain Assessment: Faces Faces Pain Scale: Hurts a little bit Pain Location: ribs Pain Descriptors / Indicators: Sore Pain Intervention(s): Monitored during session     Extremity/Trunk Assessment Upper Extremity Assessment Upper Extremity  Assessment: Overall WFL for tasks assessed;Right hand dominant   Lower Extremity Assessment Lower Extremity Assessment: Defer to PT evaluation   Cervical / Trunk Assessment Cervical / Trunk Assessment: Normal   Communication Communication Communication: No apparent difficulties   Cognition Arousal: Alert Behavior During Therapy: WFL for tasks assessed/performed Cognition: No apparent impairments             OT - Cognition Comments: can quick with movements                 Following commands: Intact       Cueing  General Comments   Cueing Techniques: Verbal cues      Exercises     Shoulder Instructions      Home Living Family/patient expects to be discharged to:: Private residence Living Arrangements: Spouse/significant other Available Help at Discharge: Family;Available PRN/intermittently (spouse works full time) Type of Home: House Home Access: Stairs to enter [employer] of Steps: 2-3  garage Entrance Stairs-Rails: None Home Layout: One level     Bathroom Shower/Tub: Producer, Television/film/video: Handicapped height     Home Equipment: None   Additional Comments: sister could help some at DC. pt reports her spouse is going out of town on 12/29      Prior Functioning/Environment Prior Level of Function : Independent/Modified Independent             Mobility Comments: no AD for mobility ADLs Comments: Indep with ADLs, IADLs    OT Problem List: Decreased strength;Decreased activity tolerance;Impaired balance (sitting and/or standing);Decreased knowledge of use of DME or AE;Decreased safety awareness;Decreased knowledge of precautions;Impaired sensation   OT Treatment/Interventions: Self-care/ADL training;Therapeutic exercise;Energy conservation;DME and/or AE instruction;Therapeutic activities;Patient/family education;Balance training      OT Goals(Current goals can be found in the care plan section)   Acute Rehab OT  Goals Patient Stated Goal: for sensation to improve in LLE OT Goal Formulation: With patient Time For Goal Achievement: 09/29/24 Potential to Achieve Goals: Good   OT Frequency:  Min 2X/week    Co-evaluation PT/OT/SLP Co-Evaluation/Treatment: Yes Reason for Co-Treatment: For patient/therapist safety;To address functional/ADL transfers PT goals addressed during session: Mobility/safety with mobility;Proper use of DME OT goals addressed during session: ADL's and self-care      AM-PAC OT 6 Clicks Daily Activity     Outcome Measure Help from another person eating meals?: None Help from another person taking care of personal grooming?: A Little Help from another person toileting, which includes using toliet, bedpan, or urinal?: A Little Help from another person bathing (including washing, rinsing, drying)?: A Little Help from another person to put on and taking off regular upper body clothing?: A Little Help from another person to put on and taking off regular lower body clothing?: A Little 6 Click Score: 19   End of Session Equipment Utilized During Treatment: Gait belt;Rolling walker (2 wheels) Nurse Communication: Mobility status  Activity Tolerance: Patient tolerated treatment well Patient left: in bed;with call bell/phone within reach;with bed alarm set  OT Visit Diagnosis: Unsteadiness on feet (R26.81);Other abnormalities of gait and mobility (R26.89);Muscle weakness (generalized) (M62.81)                Time: 8787-8767 OT Time Calculation (min): 20 min Charges:  OT General Charges $  OT Visit: 1 Visit OT Evaluation $OT Eval Moderate Complexity: 1 Mod  Mliss NOVAK, OTR/L Acute Rehab Services Office: [phone]   [name] 09/15/2024, 1:07 PM

## 2024-09-15 NOTE — Progress Notes (Signed)
 " PROGRESS NOTE    Jasmine Nelson  FMW:[id number] DOB: 07-Jan-1956 DOA: 09/14/2024 PCP: Charlett Apolinar POUR, MD    Brief Narrative:   68 year old with history of HTN, HLD, COPD, tobacco use, anxiety, ADHD, insomnia presented to [facility], ED after mechanical fall on her patio sustaining left hip fracture.  Orthopedic was consulted who performed left-sided total hip replacement on 12/21.  Assessment & Plan:  Closed left hip fracture, initial encounter (HCC) -Status post total left hip replacement on 12/21.  Postop management including DVT prophylaxis, follow-up, pain control, wound care and weightbearing prophylaxis per their service.  Leukocytosis - Suspect reactive   Essential hypertension - Continue home clonidine , hydrochlorothiazide , and losartan .  IV as needed   Hyperlipidemia - Continue home rosuvastatin    Chronic obstructive pulmonary disease (COPD) (HCC) As needed albuterol   Tobacco dependence -Counseled to quit using it     Anxiety with depression - Continue home Trintellix     ADHD - Continue home Intuniv  and Adderall   DVT prophylaxis: Eliquis     Code Status: Full Code Family Communication:   Status is: Inpatient Remains inpatient appropriate because: Final disposition per PT/OT evaluation   PT Follow up Recs:   Subjective:  Hip pain as expected.  Curious about next steps which have explained to her it will be based on physical therapy  Examination:  General exam: Appears calm and comfortable  Respiratory system: Clear to auscultation. Respiratory effort normal. Cardiovascular system: S1 & S2 heard, RRR. No JVD, murmurs, rubs, gallops or clicks. No pedal edema. Gastrointestinal system: Abdomen is nondistended, soft and nontender. No organomegaly or masses felt. Normal bowel sounds heard. Central nervous system: Alert and oriented. No focal neurological deficits. Extremities: Symmetric 5 x 5 power. Skin: Left hip dressing in place Psychiatry: Judgement  and insight appear normal. Mood & affect appropriate.                Diet Orders (From admission, onward)     Start     Ordered   09/14/24 1217  Diet Carb Modified Room service appropriate? Yes  Diet effective now       Question Answer Comment  Calorie Level Medium 1600-2000   Fluid consistency: Thin   Room service appropriate? Yes      09/14/24 1216            Objective: Vitals:   09/15/24 0200 09/15/24 0635 09/15/24 0937 09/15/24 0941  BP: (!) 112/58 112/68 (!) 156/72 (!) 147/100  Pulse: 91 93 (!) 102 81  Resp: 16 16 16 16   Temp: 98.5 F (36.9 C) 98.4 F (36.9 C) 98.2 F (36.8 C) 98.2 F (36.8 C)  TempSrc: Oral Oral Oral Oral  SpO2: 96% 95% 98% 98%  Weight:      Height:        Intake/Output Summary (Last 24 hours) at 09/15/2024 1002 Last data filed at 09/15/2024 0957 Gross per 24 hour  Intake 2665.89 ml  Output 1750 ml  Net 915.89 ml   Filed Weights   09/14/24 0125  Weight: 49.9 kg    Scheduled Meds:  acetaminophen   500 mg Oral Q6H   amphetamine -dextroamphetamine   20 mg Oral BID   apixaban   2.5 mg Oral BID   bupivacaine   20 mL     docusate sodium   100 mg Oral BID   gabapentin   2,400 mg Oral QHS   guanFACINE   1 mg Oral q morning   hydrochlorothiazide   12.5 mg Oral Daily   lidocaine  (PF)  5 mL     losartan   100 mg Oral Daily   nicotine   14 mg Transdermal Daily   rosuvastatin   5 mg Oral Daily   vortioxetine  HBr  20 mg Oral Daily   Continuous Infusions:  sodium chloride  Stopped (09/15/24 9380)    Nutritional status     Body mass index is 18.3 kg/m.  Data Reviewed:   CBC: Recent Labs  Lab 09/14/24 0139 09/15/24 0318  WBC 10.6* 17.2*  HGB 13.2 9.4*  HCT 40.2 27.7*  MCV 96.2 95.2  PLT 367 241   Basic Metabolic Panel: Recent Labs  Lab 09/14/24 0139 09/15/24 0318  NA 138 134*  K 3.9 3.7  CL 100 97*  CO2 29 28  GLUCOSE 108* 117*  BUN 14 16  CREATININE 0.63 0.69  CALCIUM  10.0 8.4*   GFR: Estimated Creatinine  Clearance: 52.3 mL/min (by C-G formula based on SCr of 0.69 mg/dL). Liver Function Tests: Recent Labs  Lab 09/14/24 0139  AST 36  ALT 26  ALKPHOS 104  BILITOT 0.2  PROT 7.8  ALBUMIN 4.7   No results for input(s): LIPASE, AMYLASE in the last 168 hours. No results for input(s): AMMONIA in the last 168 hours. Coagulation Profile: No results for input(s): INR, PROTIME in the last 168 hours. Cardiac Enzymes: No results for input(s): CKTOTAL, CKMB, CKMBINDEX, TROPONINI in the last 168 hours. BNP (last 3 results) No results for input(s): PROBNP in the last 8760 hours. HbA1C: No results for input(s): HGBA1C in the last 72 hours. CBG: No results for input(s): GLUCAP in the last 168 hours. Lipid Profile: No results for input(s): CHOL, HDL, LDLCALC, TRIG, CHOLHDL, LDLDIRECT in the last 72 hours. Thyroid  Function Tests: No results for input(s): TSH, T4TOTAL, FREET4, T3FREE, THYROIDAB in the last 72 hours. Anemia Panel: No results for input(s): VITAMINB12, FOLATE, FERRITIN, TIBC, IRON, RETICCTPCT in the last 72 hours. Sepsis Labs: No results for input(s): PROCALCITON, LATICACIDVEN in the last 168 hours.  No results found for this or any previous visit (from the past 240 hours).       Radiology Studies: DG Pelvis Portable Result Date: 09/14/2024 CLINICAL DATA:  Postop. EXAM: PORTABLE PELVIS 1-2 VIEWS COMPARISON:  Preoperative imaging. FINDINGS: Left hip arthroplasty in expected alignment. No periprosthetic lucency or fracture. Recent postsurgical change includes air and edema in the soft tissues. IMPRESSION: Left hip arthroplasty without immediate postoperative complication. Electronically Signed   By: Andrea Gasman M.D.   On: 09/14/2024 12:44   DG HIP UNILAT WITH PELVIS 1V LEFT Result Date: 09/14/2024 CLINICAL DATA:  Elective surgery. EXAM: DG HIP (WITH OR WITHOUT PELVIS) 1V*L* COMPARISON:  Preoperative imaging  FINDINGS: Six fluoroscopic spot views of the pelvis and left hip obtained in the operating room. Sequential images during hip arthroplasty. Fluoroscopy time 23 seconds. Dose 2.3 mGy. IMPRESSION: Intraoperative fluoroscopy during left hip arthroplasty. Electronically Signed   By: Andrea Gasman M.D.   On: 09/14/2024 12:00   DG C-Arm 1-60 Min-No Report Result Date: 09/14/2024 Fluoroscopy was utilized by the requesting physician.  No radiographic interpretation.   DG C-Arm 1-60 Min-No Report Result Date: 09/14/2024 Fluoroscopy was utilized by the requesting physician.  No radiographic interpretation.   DG CHEST PORT 1 VIEW Result Date: 09/14/2024 EXAM: 1 VIEW(S) XRAY OF THE CHEST 09/14/2024 05:30:50 AM COMPARISON: 11/08/2018 CLINICAL HISTORY: Closed left hip fracture, initial encounter (HCC); Preoperative testing FINDINGS: LUNGS AND PLEURA: No focal pulmonary opacity. No pleural effusion. No pneumothorax. HEART AND MEDIASTINUM: Aortic arch atherosclerosis. No acute abnormality  of the cardiac and mediastinal silhouettes. BONES AND SOFT TISSUES: No acute osseous abnormality. IMPRESSION: 1. No acute process. Electronically signed by: Waddell Calk MD 09/14/2024 05:34 AM EST RP Workstation: [id number]   DG HIP UNILAT WITH PELVIS 2-3 VIEWS LEFT Result Date: 09/14/2024 EXAM: 2 OR MORE VIEW(S) XRAY OF THE LEFT HIP 09/14/2024 02:56:00 AM COMPARISON: None available. CLINICAL HISTORY: Fall FINDINGS: BONES AND JOINTS: Acute fracture of the subcapital left femoral neck. Superior displacement of the distal fracture fragment. SOFT TISSUES: The soft tissues are unremarkable. IMPRESSION: 1. Acute displaced  subcapital left femoral neck fracture. Electronically signed by: Norman Gatlin MD 09/14/2024 03:02 AM EST RP Workstation: [id number]           LOS: 1 day   Time spent= 35 mins    Burgess JAYSON Dare, MD [facility]  If 7PM-7AM, please contact night-coverage  09/15/2024, 10:02 AM  "

## 2024-09-15 NOTE — Progress Notes (Addendum)
 Subjective: 1 Day Post-Op Procedures (LRB): ARTHROPLASTY, HIP, TOTAL, ANTERIOR APPROACH (Left) Patient reports pain as mild.    Objective: Vital signs in last 24 hours: Temp:  [98.2 F (36.8 C)-98.5 F (36.9 C)] 98.4 F (36.9 C) (12/22 1320) Pulse Rate:  [78-102] 85 (12/22 1320) Resp:  [16-18] 18 (12/22 1320) BP: (111-156)/(58-100) 111/59 (12/22 1320) SpO2:  [95 %-100 %] 100 % (12/22 1320)  Intake/Output from previous day: 12/21 0701 - 12/22 0700 In: 2645.9 [P.O.:360; I.V.:1685.9; IV Piggyback:600] Out: 1300 [Urine:1150; Blood:150] Intake/Output this shift: Total I/O In: 463.8 [P.O.:440; I.V.:23.8] Out: 450 [Urine:450]  Recent Labs    09/14/24 0139 09/15/24 0318  HGB 13.2 9.4*   Recent Labs    09/14/24 0139 09/15/24 0318  WBC 10.6* 17.2*  RBC 4.18 2.91*  HCT 40.2 27.7*  PLT 367 241   Recent Labs    09/14/24 0139 09/15/24 0318  NA 138 134*  K 3.9 3.7  CL 100 97*  CO2 29 28  BUN 14 16  CREATININE 0.63 0.69  GLUCOSE 108* 117*  CALCIUM  10.0 8.4*   No results for input(s): LABPT, INR in the last 72 hours.  Neurologically intact Neurovascular intact Sensation intact distally Intact pulses distally Dorsiflexion/Plantar flexion intact Incision: scant drainage No cellulitis present Compartment soft   Assessment/Plan: 1 Day Post-Op Procedures (LRB): ARTHROPLASTY, HIP, TOTAL, ANTERIOR APPROACH (Left) Up with therapy WBAT LLE ABLA- mild and stable Once d/c home, would like HHPT Post-op norco rx sent to [facility] Dvt ppx- scds and eliquis  (sent to [facility]) F/u with ortho two weeks post-op     Ronal LITTIE Grave 09/15/2024, 4:13 PM

## 2024-09-15 NOTE — Progress Notes (Signed)
 PT Cancellation Note  Patient Details Name: Jasmine Nelson MRN: [id number] DOB: 1956/08/19   Cancelled Treatment:    Reason Eval/Treat Not Completed: Medical issues which prohibited therapy (pt fell in bathroom this morning with nursing. Xray of hip pending. Will follow.)   Sylvan Delon Copp PT 09/15/2024  Acute Rehabilitation Services  Office [phone]

## 2024-09-15 NOTE — Progress Notes (Signed)
 Initial Nutrition Assessment  DOCUMENTATION CODES:   Severe malnutrition in context of chronic illness  INTERVENTION:  - Carb Modified diet per MD.  - Boost Breeze daily, provides 250 kcal and 9 grams of protein - Encourage intake of 3 meals a day as tolerated.  - Multivitamin with minerals daily.  - Monitor weight trends.  NUTRITION DIAGNOSIS:   Severe Malnutrition related to chronic illness (COPD) as evidenced by severe fat depletion, severe muscle depletion  GOAL:   Patient will meet greater than or equal to 90% of their needs  MONITOR:   PO intake, Supplement acceptance, Weight trends  REASON FOR ASSESSMENT:   Consult Hip fracture protocol  ASSESSMENT:   68 y.o. female with PMH of HTN, HLD, COPD, tobacco use disorder, anxiety, ADHD, and insomnia wo presented after a mechanical fall onto her patio last night resulting in left femoral neck fracture.  Patient reports a UBW of 118# and she does not feel she has lost any significant weight within the past year.  Per EMR, no significant changes in weight over the past year.   Patient endorses only eating around 1 meal a day at home, usually dinner. Eating normally PTA. She endorses taking calcium , vitamin C, and zinc at home.  Current appetite is good and patient feels she is eating noramlly. Discussed increased nutrient needs to promote healing and encouraged intake of 3 meals a day as tolerated. Patient does not think she will like Ensure but agreeable to try Boost Breeze instead.   Medications reviewed and include: Colace  .Labs reviewed:  Na 134 HA1C 6.2 (as of 1/16)   NUTRITION - FOCUSED PHYSICAL EXAM:  Flowsheet Row Most Recent Value  Orbital Region Severe depletion  Upper Arm Region Moderate depletion  Thoracic and Lumbar Region Severe depletion  Buccal Region Moderate depletion  Temple Region Moderate depletion  Clavicle Bone Region Moderate depletion  Clavicle and Acromion Bone Region Severe depletion   Scapular Bone Region Unable to assess  Dorsal Hand Moderate depletion  Patellar Region Severe depletion  Anterior Thigh Region Severe depletion  Posterior Calf Region Moderate depletion  Edema (RD Assessment) None  Hair Reviewed  Eyes Reviewed  Mouth Reviewed  Skin Reviewed  Nails Reviewed    Diet Order:   Diet Order             Diet Carb Modified Room service appropriate? Yes  Diet effective now                   EDUCATION NEEDS:  Education needs have been addressed  Skin:  Skin Assessment: Skin Integrity Issues: Skin Integrity Issues:: Incisions Incisions: Surgical on left thigh  Last BM:  12/20  Height:  Ht Readings from Last 1 Encounters:  09/14/24 5' 5 (1.651 m)   Weight:  Wt Readings from Last 1 Encounters:  09/14/24 49.9 kg    BMI:  Body mass index is 18.3 kg/m.  Estimated Nutritional Needs:  Kcal:  1750-1900 kcals Protein:  70-80 grams Fluid:  >/= 1.8L    Trude Ned RD, LDN Contact via Secure Chat.

## 2024-09-16 ENCOUNTER — Encounter (HOSPITAL_COMMUNITY): Payer: Self-pay | Admitting: Orthopaedic Surgery

## 2024-09-16 ENCOUNTER — Other Ambulatory Visit (HOSPITAL_COMMUNITY): Payer: Self-pay

## 2024-09-16 DIAGNOSIS — S72012A Unspecified intracapsular fracture of left femur, initial encounter for closed fracture: Secondary | ICD-10-CM | POA: Diagnosis not present

## 2024-09-16 LAB — CBC
HCT: 28.8 % — ABNORMAL LOW (ref 36.0–46.0)
Hemoglobin: 9.8 g/dL — ABNORMAL LOW (ref 12.0–15.0)
MCH: 32 pg (ref 26.0–34.0)
MCHC: 34 g/dL (ref 30.0–36.0)
MCV: 94.1 fL (ref 80.0–100.0)
Platelets: 235 K/uL (ref 150–400)
RBC: 3.06 MIL/uL — ABNORMAL LOW (ref 3.87–5.11)
RDW: 13.2 % (ref 11.5–15.5)
WBC: 15.1 K/uL — ABNORMAL HIGH (ref 4.0–10.5)
nRBC: 0 % (ref 0.0–0.2)

## 2024-09-16 LAB — BASIC METABOLIC PANEL WITH GFR
Anion gap: 9 (ref 5–15)
BUN: 14 mg/dL (ref 8–23)
CO2: 29 mmol/L (ref 22–32)
Calcium: 9.1 mg/dL (ref 8.9–10.3)
Chloride: 98 mmol/L (ref 98–111)
Creatinine, Ser: 0.57 mg/dL (ref 0.44–1.00)
GFR, Estimated: >60 mL/min
Glucose, Bld: 113 mg/dL — ABNORMAL HIGH (ref 70–99)
Potassium: 3.5 mmol/L (ref 3.5–5.1)
Sodium: 137 mmol/L (ref 135–145)

## 2024-09-16 MED ORDER — POTASSIUM CHLORIDE CRYS ER 20 MEQ PO TBCR
40.0000 meq | EXTENDED_RELEASE_TABLET | Freq: Once | ORAL | Status: AC
  Administered 2024-09-16: 40 meq via ORAL
  Filled 2024-09-16: qty 2

## 2024-09-16 MED ORDER — DOCUSATE SODIUM 100 MG PO CAPS
100.0000 mg | ORAL_CAPSULE | Freq: Two times a day (BID) | ORAL | 0 refills | Status: AC
  Filled 2024-09-16: qty 60, 30d supply, fill #0

## 2024-09-16 MED ORDER — POLYETHYLENE GLYCOL 3350 17 GM/SCOOP PO POWD
17.0000 g | Freq: Every day | ORAL | 0 refills | Status: AC | PRN
  Filled 2024-09-16: qty 238, 14d supply, fill #0

## 2024-09-16 NOTE — Plan of Care (Signed)
" °  Problem: Health Behavior/Discharge Planning: Goal: Ability to manage health-related needs will improve Outcome: Adequate for Discharge   Problem: Clinical Measurements: Goal: Ability to maintain clinical measurements within normal limits will improve Outcome: Adequate for Discharge Goal: Will remain free from infection Outcome: Progressing Goal: Diagnostic test results will improve Outcome: Progressing Goal: Respiratory complications will improve Outcome: Adequate for Discharge Goal: Cardiovascular complication will be avoided Outcome: Progressing   Problem: Activity: Goal: Risk for activity intolerance will decrease Outcome: Adequate for Discharge   Problem: Coping: Goal: Level of anxiety will decrease Outcome: Progressing   Problem: Elimination: Goal: Will not experience complications related to bowel motility Outcome: Completed/Met   Problem: Pain Managment: Goal: General experience of comfort will improve and/or be controlled Outcome: Progressing   Problem: Safety: Goal: Ability to remain free from injury will improve Outcome: Progressing   Problem: Skin Integrity: Goal: Risk for impaired skin integrity will decrease Outcome: Adequate for Discharge   Problem: Education: Goal: Knowledge of the prescribed therapeutic regimen will improve Outcome: Adequate for Discharge Goal: Understanding of discharge needs will improve Outcome: Progressing   Problem: Activity: Goal: Ability to avoid complications of mobility impairment will improve Outcome: Progressing Goal: Ability to tolerate increased activity will improve Outcome: Adequate for Discharge   Problem: Clinical Measurements: Goal: Postoperative complications will be avoided or minimized Outcome: Adequate for Discharge   Problem: Pain Management: Goal: Pain level will decrease with appropriate interventions Outcome: Adequate for Discharge   Problem: Skin Integrity: Goal: Will show signs of wound  healing Outcome: Progressing   "

## 2024-09-16 NOTE — Progress Notes (Signed)
 Inpatient Rehab Admissions Coordinator:   Consult received and chart reviewed.  Pt is a 68 y/o female with PMH of HTN, HLD, COPD who presented to [facility] after a mechanical fall in which she sustained a hip fracture.  Orthopedics was consulted and she underwent  L THA on 12/21 per Dr. Jerri.  POD 1 she was CGA to supervision with OT and overall min assist with PT, note LLE buckling during gait with ongoing sensory deficits in LLE.  She does have a low grade temp, not concerning per ortho.  She does not have the medical complexity to support authorization for CIR from her insurance, based on current payor trends and appears to prefer home with [facility].  I will sign off at this time.   Reche Lowers, PT, DPT Admissions Coordinator [phone] 09/16/2024 8:51 AM

## 2024-09-16 NOTE — TOC Transition Note (Signed)
 Transition of Care [facility]) - Discharge Note   Patient Details  Name: Jasmine Nelson MRN: [id number] Date of Birth: [birth year]  Transition of Care [facility]) CM/SW Contact:  NORMAN ASPEN, LCSW Phone Number: 09/16/2024, 11:12 AM   Clinical Narrative:     Met with pt today who confirms her plan is for dc home.  Family working on making sure she has needed assistance in the home.  Pt agreeable with recommendations for HHPT/OT and no agency preference - referral made/ accepted with [facility].  DME was ordered yesterday via [employer] and items have been delivered to room already.  No further IP CM needs,  Final next level of care: Home w Home Health Services Barriers to Discharge: Barriers Resolved   Patient Goals and CMS Choice Patient states their goals for this hospitalization and ongoing recovery are:: To return home          Discharge Placement                       Discharge Plan and Services Additional resources added to the After Visit Summary for   In-house Referral: Clinical Social Work Discharge Planning Services: NA Post Acute Care Choice: NA          DME Arranged: Bedside commode, Walker rolling DME Agency: [employer] Date DME Agency Contacted: 09/15/24     HH Arranged: PT, OT HH Agency: [facility] Date [facility] Agency Contacted: 09/16/24 Time HH Agency Contacted: 1111 Representative spoke with at [facility] Agency: accepted on [employer]  Social Drivers of Health (SDOH) Interventions SDOH Screenings   Food Insecurity: No Food Insecurity (09/14/2024)  Housing: Low Risk (09/14/2024)  Transportation Needs: No Transportation Needs (09/14/2024)  Utilities: Not At Risk (09/14/2024)  Alcohol  Screen: Low Risk (08/08/2024)  Depression (PHQ2-9): Low Risk (06/09/2024)  Financial Resource Strain: Low Risk (08/08/2024)  Physical Activity: Inactive (08/08/2024)  Social Connections: Socially Isolated (09/14/2024)  Stress: Stress Concern Present (08/08/2024)  Tobacco  Use: High Risk (09/14/2024)  Health Literacy: Adequate Health Literacy (06/09/2024)     Readmission Risk Interventions    09/15/2024   12:41 PM  Readmission Risk Prevention Plan  Post Dischage Appt Complete  Medication Screening Complete  Transportation Screening Complete

## 2024-09-16 NOTE — Plan of Care (Signed)
" °  Problem: Education: Goal: Knowledge of General Education information will improve Description: Including pain rating scale, medication(s)/side effects and non-pharmacologic comfort measures Outcome: Adequate for Discharge   Problem: Health Behavior/Discharge Planning: Goal: Ability to manage health-related needs will improve Outcome: Progressing   Problem: Clinical Measurements: Goal: Ability to maintain clinical measurements within normal limits will improve Outcome: Progressing Goal: Will remain free from infection Outcome: Progressing Goal: Diagnostic test results will improve Outcome: Progressing Goal: Respiratory complications will improve Outcome: Progressing Goal: Cardiovascular complication will be avoided Outcome: Progressing   Problem: Activity: Goal: Risk for activity intolerance will decrease Outcome: Adequate for Discharge   Problem: Nutrition: Goal: Adequate nutrition will be maintained Outcome: Completed/Met   Problem: Coping: Goal: Level of anxiety will decrease Outcome: Progressing   Problem: Elimination: Goal: Will not experience complications related to bowel motility Outcome: Progressing Goal: Will not experience complications related to urinary retention Outcome: Completed/Met   Problem: Pain Managment: Goal: General experience of comfort will improve and/or be controlled Outcome: Progressing   Problem: Safety: Goal: Ability to remain free from injury will improve Outcome: Adequate for Discharge   Problem: Skin Integrity: Goal: Risk for impaired skin integrity will decrease Outcome: Progressing   Problem: Education: Goal: Knowledge of the prescribed therapeutic regimen will improve Outcome: Progressing Goal: Understanding of discharge needs will improve Outcome: Progressing Goal: Individualized Educational Video(s) Outcome: Completed/Met   Problem: Activity: Goal: Ability to avoid complications of mobility impairment will  improve Outcome: Progressing Goal: Ability to tolerate increased activity will improve Outcome: Adequate for Discharge   Problem: Clinical Measurements: Goal: Postoperative complications will be avoided or minimized Outcome: Progressing   Problem: Pain Management: Goal: Pain level will decrease with appropriate interventions Outcome: Adequate for Discharge   Problem: Skin Integrity: Goal: Will show signs of wound healing Outcome: Progressing   "

## 2024-09-16 NOTE — Progress Notes (Signed)
 Physical Therapy Treatment Patient Details Name: Jasmine Nelson MRN: [id number] DOB: [birth year] Today's Date: 09/16/2024   History of Present Illness 68 y.o. year old female presents to [facility], ED after a mechanical fall onto her patio last night resulting in inability to ambulate with severe left hip pain. Dx of fracture of neck of femur. s/p THA-AA 09/14/24 with fascia ilaica block.. Pt with past medical history of hypertension, hyperlipidemia, COPD, tobacco use disorder, anxiety, ADHD, and insomnia.    PT Comments  POD # 2 AxO x 3 pleasant Lady who lives home with Spouse.  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  Pt was IND propr to her fall.  Pt plans to D/C to home tomorrow when Spouse is only working 1/2 day.    Assisted OOB to amb in hallway and practice stairs.  Pt given handout HEP and reviewed.  Instructed on use of ICE.     If plan is discharge home, recommend the following: A little help with walking and/or transfers;A little help with bathing/dressing/bathroom;Assistance with cooking/housework;Help with stairs or ramp for entrance   Can travel by private vehicle        Equipment Recommendations  Rolling walker (2 wheels);BSC/3in1    Recommendations for Other Services       Precautions / Restrictions Precautions Precautions: Fall Restrictions Weight Bearing Restrictions Per Provider Order: No LLE Weight Bearing Per Provider Order: Weight bearing as tolerated     Mobility  Bed Mobility Overal bed mobility: Needs Assistance Bed Mobility: Supine to Sit     Supine to sit: Supervision, HOB elevated, Used rails     General bed mobility comments: self able using her belt    Transfers Overall transfer level: Needs assistance Equipment used: Rolling walker (2 wheels) Transfers: Sit to/from Stand Sit to Stand: Supervision           General transfer comment: increased time with good use of hands to steady self.     Ambulation/Gait Ambulation/Gait assistance: Supervision, Contact guard assist Gait Distance (Feet): 85 Feet Assistive device: Rolling walker (2 wheels) Gait Pattern/deviations: Step-to pattern, Step-through pattern Gait velocity: decreased     General Gait Details: tolerated a functional distance amb in hallway with < 25% VC's on proper walker to self distance and safety with turns.   Stairs Stairs: Yes Stairs assistance: Min assist Stair Management: No rails, Step to pattern, Forwards, With walker Number of Stairs: 2 General stair comments: up forward 2 steps NO RAILS using walker 50% VC's on proper walker placement and proper sequencing.   Wheelchair Mobility     Tilt Bed    Modified Rankin (Stroke Patients Only)       Balance                                            Communication Communication Communication: No apparent difficulties  Cognition Arousal: Alert Behavior During Therapy: WFL for tasks assessed/performed   PT - Cognitive impairments: No apparent impairments                       PT - Cognition Comments: AxO x 3 pleasant Lady who lives home with Spouse.  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  Pt was IND propr to her fall. Following commands: Intact      Cueing Cueing Techniques: Verbal  cues  Exercises      General Comments        Pertinent Vitals/Pain Pain Assessment Pain Assessment: 0-10 Pain Score: 7  Pain Location: L hip with activity Pain Descriptors / Indicators: Discomfort, Operative site guarding, Tender Pain Intervention(s): Monitored during session, Premedicated before session, Repositioned, Ice applied    Home Living                          Prior Function            PT Goals (current goals can now be found in the care plan section) Progress towards PT goals: Progressing toward goals    Frequency    7X/week      PT Plan      Co-evaluation               AM-PAC PT 6 Clicks Mobility   Outcome Measure  Help needed turning from your back to your side while in a flat bed without using bedrails?: None Help needed moving from lying on your back to sitting on the side of a flat bed without using bedrails?: None Help needed moving to and from a bed to a chair (including a wheelchair)?: None Help needed standing up from a chair using your arms (e.g., wheelchair or bedside chair)?: None Help needed to walk in hospital room?: A Little Help needed climbing 3-5 steps with a railing? : A Little 6 Click Score: 22    End of Session Equipment Utilized During Treatment: Gait belt Activity Tolerance: Patient tolerated treatment well Patient left: in chair;with call bell/phone within reach Nurse Communication: Mobility status PT Visit Diagnosis: Unsteadiness on feet (R26.81)     Time: 8845-8781 PT Time Calculation (min) (ACUTE ONLY): 24 min  Charges:    $Gait Training: 8-22 mins $Therapeutic Exercise: 8-22 mins PT General Charges $$ ACUTE PT VISIT: 1 Visit                     {Dietrich Samuelson  PTA Acute  Rehabilitation Services Office M-F          [phone]

## 2024-09-16 NOTE — Progress Notes (Signed)
 Subjective: 2 Days Post-Op Procedures (LRB): ARTHROPLASTY, HIP, TOTAL, ANTERIOR APPROACH (Left) Patient reports pain as mild.    Objective: Vital signs in last 24 hours: Temp:  [98.2 F (36.8 C)-100.5 F (38.1 C)] 100.5 F (38.1 C) (12/23 0612) Pulse Rate:  [81-102] 90 (12/23 0612) Resp:  [16-18] 17 (12/23 0612) BP: (111-164)/(59-100) 164/92 (12/23 0612) SpO2:  [98 %-100 %] 99 % (12/23 0612)  Intake/Output from previous day: 12/22 0701 - 12/23 0700 In: 903.8 [P.O.:880; I.V.:23.8] Out: 450 [Urine:450] Intake/Output this shift: No intake/output data recorded.  Recent Labs    09/14/24 0139 09/15/24 0318 09/16/24 0323  HGB 13.2 9.4* 9.8*   Recent Labs    09/15/24 0318 09/16/24 0323  WBC 17.2* 15.1*  RBC 2.91* 3.06*  HCT 27.7* 28.8*  PLT 241 235   Recent Labs    09/15/24 0318 09/16/24 0323  NA 134* 137  K 3.7 3.5  CL 97* 98  CO2 28 29  BUN 16 14  CREATININE 0.69 0.57  GLUCOSE 117* 113*  CALCIUM  8.4* 9.1   No results for input(s): LABPT, INR in the last 72 hours.  Neurologically intact Neurovascular intact Sensation intact distally Intact pulses distally Dorsiflexion/Plantar flexion intact Incision: scant drainage No cellulitis present Compartment soft   Assessment/Plan: 2 Days Post-Op Procedures (LRB): ARTHROPLASTY, HIP, TOTAL, ANTERIOR APPROACH (Left) Up with therapy WBAT LLE ABLA- mild and stable Once d/c home, would like HHPT.  Ok for d/c from ortho standpoint once cleared by PT.  Patient would like to go home tomorrow if possible.  Post-op norco rx sent to [facility] Dvt ppx- scds and eliquis  (sent to [facility]) Post-op low grade fever- likely constipation/atelectasis.  Too early for hip infection and does not exhibit any signs consistent with this. F/u with ortho two weeks post-op     Ronal LITTIE Grave 09/16/2024, 7:27 AM

## 2024-09-16 NOTE — Progress Notes (Signed)
 " PROGRESS NOTE    Jasmine Nelson  FMW:[id number] DOB: Jul 09, 1956 DOA: 09/14/2024 PCP: Charlett Apolinar POUR, MD    Brief Narrative:   68 year old with history of HTN, HLD, COPD, tobacco use, anxiety, ADHD, insomnia presented to [facility], ED after mechanical fall on her patio sustaining left hip fracture.  Orthopedic was consulted who performed left-sided total hip replacement on 12/21.  PT recommending CIR, consulted their service.  Patient prefers to go home with home health services  Assessment & Plan:  Closed left hip fracture, initial encounter [facility]) -Status post total left hip replacement on 12/21.  Postop management including DVT prophylaxis, follow-up, pain control, wound care and weightbearing prophylaxis per their service.  Leukocytosis - Suspect reactive   Essential hypertension - Continue home clonidine , hydrochlorothiazide , and losartan .  IV as needed   Hyperlipidemia - Continue home rosuvastatin    Chronic obstructive pulmonary disease (COPD) (HCC) As needed albuterol   Tobacco dependence -Counseled to quit using it     Anxiety with depression - Continue home Trintellix     ADHD - Continue home Intuniv  and Adderall   DVT prophylaxis: Eliquis     Code Status: Full Code Family Communication:   Status is: Inpatient Remains inpatient appropriate because: PT recommending CIR, team consulted   PT Follow up Recs: Acute Inpatient Rehab (3hours/Day)09/16/2024 0805  Subjective:  Feeling well, prefers to go home instead of CIR.  Examination:  General exam: Appears calm and comfortable  Respiratory system: Clear to auscultation. Respiratory effort normal. Cardiovascular system: S1 & S2 heard, RRR. No JVD, murmurs, rubs, gallops or clicks. No pedal edema. Gastrointestinal system: Abdomen is nondistended, soft and nontender. No organomegaly or masses felt. Normal bowel sounds heard. Central nervous system: Alert and oriented. No focal neurological  deficits. Extremities: Symmetric 5 x 5 power. Skin: Left hip dressing in place Psychiatry: Judgement and insight appear normal. Mood & affect appropriate.                Diet Orders (From admission, onward)     Start     Ordered   09/14/24 1217  Diet Carb Modified Room service appropriate? Yes  Diet effective now       Question Answer Comment  Calorie Level Medium 1600-2000   Fluid consistency: Thin   Room service appropriate? Yes      09/14/24 1216            Objective: Vitals:   09/15/24 1320 09/15/24 2133 09/16/24 0612 09/16/24 1306  BP: (!) 111/59 131/77 (!) 164/92 104/69  Pulse: 85 89 90 86  Resp: 18 18 17 17   Temp: 98.4 F (36.9 C) (!) 100.4 F (38 C) (!) 100.5 F (38.1 C) 97.9 F (36.6 C)  TempSrc: Oral Oral Oral Oral  SpO2: 100% 100% 99% 100%  Weight:      Height:        Intake/Output Summary (Last 24 hours) at 09/16/2024 1330 Last data filed at 09/16/2024 1000 Gross per 24 hour  Intake 1023.75 ml  Output --  Net 1023.75 ml   Filed Weights   09/14/24 0125  Weight: 49.9 kg    Scheduled Meds:  amphetamine -dextroamphetamine   20 mg Oral BID   apixaban   2.5 mg Oral BID   bupivacaine   20 mL     docusate sodium   100 mg Oral BID   feeding supplement  1 Container Oral BID BM   gabapentin   2,400 mg Oral QHS   guanFACINE   1 mg Oral q morning   hydrochlorothiazide   12.5 mg Oral Daily   lidocaine  (PF)  5 mL     losartan   100 mg Oral Daily   multivitamin with minerals  1 tablet Oral Daily   nicotine   14 mg Transdermal Daily   rosuvastatin   5 mg Oral Daily   vortioxetine  HBr  20 mg Oral Daily   Continuous Infusions:  sodium chloride  Stopped (09/15/24 9380)    Nutritional status Signs/Symptoms: severe fat depletion, severe muscle depletion Interventions: Refer to RD note for recommendations, Boost Breeze, MVI Body mass index is 18.3 kg/m.  Data Reviewed:   CBC: Recent Labs  Lab 09/14/24 0139 09/15/24 0318 09/16/24 0323  WBC  10.6* 17.2* 15.1*  HGB 13.2 9.4* 9.8*  HCT 40.2 27.7* 28.8*  MCV 96.2 95.2 94.1  PLT 367 241 235   Basic Metabolic Panel: Recent Labs  Lab 09/14/24 0139 09/15/24 0318 09/16/24 0323  NA 138 134* 137  K 3.9 3.7 3.5  CL 100 97* 98  CO2 29 28 29   GLUCOSE 108* 117* 113*  BUN 14 16 14   CREATININE 0.63 0.69 0.57  CALCIUM  10.0 8.4* 9.1   GFR: Estimated Creatinine Clearance: 52.3 mL/min (by C-G formula based on SCr of 0.57 mg/dL). Liver Function Tests: Recent Labs  Lab 09/14/24 0139  AST 36  ALT 26  ALKPHOS 104  BILITOT 0.2  PROT 7.8  ALBUMIN 4.7   No results for input(s): LIPASE, AMYLASE in the last 168 hours. No results for input(s): AMMONIA in the last 168 hours. Coagulation Profile: No results for input(s): INR, PROTIME in the last 168 hours. Cardiac Enzymes: No results for input(s): CKTOTAL, CKMB, CKMBINDEX, TROPONINI in the last 168 hours. BNP (last 3 results) No results for input(s): PROBNP in the last 8760 hours. HbA1C: No results for input(s): HGBA1C in the last 72 hours. CBG: No results for input(s): GLUCAP in the last 168 hours. Lipid Profile: No results for input(s): CHOL, HDL, LDLCALC, TRIG, CHOLHDL, LDLDIRECT in the last 72 hours. Thyroid  Function Tests: No results for input(s): TSH, T4TOTAL, FREET4, T3FREE, THYROIDAB in the last 72 hours. Anemia Panel: No results for input(s): VITAMINB12, FOLATE, FERRITIN, TIBC, IRON, RETICCTPCT in the last 72 hours. Sepsis Labs: No results for input(s): PROCALCITON, LATICACIDVEN in the last 168 hours.  No results found for this or any previous visit (from the past 240 hours).       Radiology Studies: DG HIP UNILAT WITH PELVIS 2-3 VIEWS LEFT Result Date: 09/15/2024 EXAM: 2 VIEW(S) XRAY OF THE LEFT HIP 09/15/2024 11:05:00 AM COMPARISON: None available. CLINICAL HISTORY: Deficient knowledge of open reduction and internal (ORIF) fixation of hip FINDINGS:  BONES AND JOINTS: Left total hip arthroplasty with post-surgical changes. SOFT TISSUES: Expected soft tissue gas overlying the left hip. IMPRESSION: 1. Left total hip arthroplasty with post-surgical changes and expected soft tissue gas overlying the left hip. Electronically signed by: Ryan Chess MD 09/15/2024 11:36 AM EST RP Workstation: [id number]           LOS: 2 days   Time spent= 35 mins    Burgess JAYSON Dare, MD [facility]  If 7PM-7AM, please contact night-coverage  09/16/2024, 1:30 PM  "

## 2024-09-17 ENCOUNTER — Other Ambulatory Visit (HOSPITAL_COMMUNITY): Payer: Self-pay

## 2024-09-17 DIAGNOSIS — S72012A Unspecified intracapsular fracture of left femur, initial encounter for closed fracture: Secondary | ICD-10-CM | POA: Diagnosis not present

## 2024-09-17 MED ORDER — NICOTINE 14 MG/24HR TD PT24
14.0000 mg | MEDICATED_PATCH | Freq: Every day | TRANSDERMAL | 0 refills | Status: AC
  Filled 2024-09-17: qty 28, 28d supply, fill #0

## 2024-09-17 NOTE — Discharge Summary (Signed)
 Physician Discharge Summary  Jasmine Nelson FMW:[id number] DOB: 10-09-1955 DOA: 09/14/2024  PCP: Charlett Apolinar POUR, MD  Admit date: 09/14/2024 Discharge date: 09/17/2024  Admitted From: Home Disposition: Home with home health  Recommendations for Outpatient Follow-up:  Follow up with PCP in 1-2 weeks Please obtain BMP/CBC in one week your next doctors visit.  Pain medications with bowel regimen prescribed Eliquis  twice daily for DVT prophylaxis Outpatient follow-up with orthopedic   Discharge Condition: Stable CODE STATUS: Full code Diet recommendation: Low-salt  Brief/Interim Summary: Brief Narrative:   68 year old with history of HTN, HLD, COPD, tobacco use, anxiety, ADHD, insomnia presented to [facility], ED after mechanical fall on her patio sustaining left hip fracture.  Orthopedic was consulted who performed left-sided total hip replacement on 12/21.  PT recommending CIR, consulted their service.  Patient prefers to go home with home health services.  Appropriate arrangements were made  Assessment & Plan:  Closed left hip fracture, initial encounter [facility]) -Status post total left hip replacement on 12/21.  Postop management including DVT prophylaxis, follow-up, pain control, wound care and weightbearing prophylaxis per their service.  Eliquis  and pain medications were prescribed by orthopedic team.  Bowel regimen was also prescribed by me.  Leukocytosis - Suspect reactive   Essential hypertension, Stable - Continue home clonidine , hydrochlorothiazide , and losartan .    Hyperlipidemia - Continue home rosuvastatin    Chronic obstructive pulmonary disease (COPD) (HCC) As needed albuterol   Tobacco dependence -Counseled to quit using it     Anxiety with depression - Continue home Trintellix     ADHD - Continue home Intuniv  and Adderall   DVT prophylaxis: Eliquis     Code Status: Full Code Family Communication:   Status is: Inpatient Remains inpatient appropriate  because: Discharge home today   PT Follow up Recs: Home Health Pt12/23/2025 1259  Subjective: Feeling well awaiting a ride for discharge No complaints Examination:  General exam: Appears calm and comfortable  Respiratory system: Clear to auscultation. Respiratory effort normal. Cardiovascular system: S1 & S2 heard, RRR. No JVD, murmurs, rubs, gallops or clicks. No pedal edema. Gastrointestinal system: Abdomen is nondistended, soft and nontender. No organomegaly or masses felt. Normal bowel sounds heard. Central nervous system: Alert and oriented. No focal neurological deficits. Extremities: Symmetric 5 x 5 power. Skin: Left hip dressing in place Psychiatry: Judgement and insight appear normal. Mood & affect appropriate.    Discharge Diagnoses:  Principal Problem:   Closed subcapital fracture of neck of femur, left, initial encounter [facility]) Active Problems:   Anxiety with depression   Tobacco dependence   Closed left hip fracture, initial encounter [facility])   Essential hypertension   Hyperlipidemia   Chronic obstructive pulmonary disease (COPD) (HCC)   ADHD   Protein-calorie malnutrition, severe      Discharge Exam: Vitals:   09/16/24 2034 09/17/24 0416  BP: 117/66 (!) 141/71  Pulse: 89 (!) 53  Resp: 17 16  Temp: 99.3 F (37.4 C) 99.7 F (37.6 C)  SpO2: 100% 96%   Vitals:   09/16/24 1306 09/16/24 1727 09/16/24 2034 09/17/24 0416  BP: 104/69 124/71 117/66 (!) 141/71  Pulse: 86 82 89 (!) 53  Resp: 17 16 17 16   Temp: 97.9 F (36.6 C) 98.2 F (36.8 C) 99.3 F (37.4 C) 99.7 F (37.6 C)  TempSrc: Oral Oral Oral Oral  SpO2: 100% 99% 100% 96%  Weight:      Height:          Discharge Instructions   Allergies as of 09/17/2024  Reactions   Lisinopril  Cough   Mild cough after a month         Medication List     STOP taking these medications    cloNIDine  0.1 MG tablet Commonly known as: CATAPRES        TAKE these medications     amphetamine -dextroamphetamine  20 MG tablet Commonly known as: ADDERALL Take 1 tablet (20 mg total) by mouth 2 (two) times daily.   Belsomra  20 MG Tabs Generic drug: Suvorexant  Take 1 tablet (20 mg total) by mouth at bedtime.   clobetasol  cream 0.05 % Commonly known as: TEMOVATE  Apply 1 Application topically 2 (two) times daily.   cycloSPORINE  (PF) 0.09 % Soln Place 1 drop into both eyes 2 (two) times daily.   docusate sodium  100 MG capsule Commonly known as: COLACE Take 1 capsule (100 mg total) by mouth 2 (two) times daily.   Easy Touch Alcohol  Prep Medium 70 % Pads Use daily for Tymlos  injection   Eliquis  2.5 MG Tabs tablet Generic drug: apixaban  Take 1 tablet (2.5 mg total) by mouth 2 (two) times daily. To be taken after surgery to prevent blood clots   gabapentin  400 MG capsule Commonly known as: NEURONTIN  Take 6 capsules (2,400 mg total) by mouth at bedtime. (09-05-24)   guanFACINE  1 MG Tb24 ER tablet Commonly known as: Intuniv  Take 1 tablet (1 mg total) by mouth every morning.   hydrochlorothiazide  12.5 MG tablet Commonly known as: HYDRODIURIL  Take 1 tablet (12.5 mg total) by mouth daily. Please schedule appt for further refills   HYDROcodone -acetaminophen  5-325 MG tablet Commonly known as: NORCO/VICODIN Take 1 tablet by mouth every 6 (six) hours as needed for moderate pain (pain score 4-6) or severe pain (pain score 7-10).   losartan  100 MG tablet Commonly known as: COZAAR  Take 1 tablet (100 mg total) by mouth daily.   nicotine  14 mg/24hr patch Commonly known as: NICODERM CQ  - dosed in mg/24 hours Place 1 patch (14 mg total) onto the skin daily. Start taking on: September 18, 2024   polyethylene glycol powder 17 GM/SCOOP powder Commonly known as: GLYCOLAX /MIRALAX  Take 17 g by mouth daily as needed for mild constipation or moderate constipation. Dissolve 1 capful (17g) in 4-8 ounces of liquid and take by mouth daily.   rosuvastatin  5 MG tablet Commonly  known as: Crestor  Take 1 tablet (5 mg total) by mouth daily.   sharps container Use as needed   TechLite Pen Needles 31G X 5 MM Misc Generic drug: Insulin  Pen Needle Use with Tymlos  daily   Trintellix  20 MG Tabs tablet Generic drug: vortioxetine  HBr Take 1 tablet (20 mg total) by mouth daily.   Tymlos  3120 MCG/1.56ML Sopn Generic drug: Abaloparatide  Inject 80 mcg into the skin daily.        Contact information for follow-up providers     Jule Ronal CROME, PA-C. Schedule an appointment as soon as possible for a visit in 2 week(s).   Specialty: Orthopedic Surgery Contact information: [address] [place] [address] [phone]         Charlett Apolinar POUR, MD .   Specialties: Internal Medicine, Pediatrics Contact information: [address] [place] [address] [phone]              Contact information for after-discharge care     Home Medical Care     [facility]) .   Service: Home Health Services Contact information: [address] [place]  [phone]  [phone]                    Allergies[1]  You were cared for by a hospitalist during your hospital stay. If you have any questions about your discharge medications or the care you received while you were in the hospital after you are discharged, you can call the unit and asked to speak with the hospitalist on call if the hospitalist that took care of you is not available. Once you are discharged, your primary care physician will handle any further medical issues. Please note that no refills for any discharge medications will be authorized once you are discharged, as it is imperative that you return to your primary care physician (or establish a relationship with a primary care physician if you do not have one) for your aftercare needs so that they can reassess your need for medications and monitor your lab values.  You were cared for  by a hospitalist during your hospital stay. If you have any questions about your discharge medications or the care you received while you were in the hospital after you are discharged, you can call the unit and asked to speak with the hospitalist on call if the hospitalist that took care of you is not available. Once you are discharged, your primary care physician will handle any further medical issues. Please note that NO REFILLS for any discharge medications will be authorized once you are discharged, as it is imperative that you return to your primary care physician (or establish a relationship with a primary care physician if you do not have one) for your aftercare needs so that they can reassess your need for medications and monitor your lab values.  Please request your Prim.MD to go over all Hospital Tests and Procedure/Radiological results at the follow up, please get all Hospital records sent to your Prim MD by signing hospital release before you go home.  Get CBC, CMP, 2 view Chest X ray checked  by Primary MD during your next visit or SNF MD in 5-7 days ( we routinely change or add medications that can affect your baseline labs and fluid status, therefore we recommend that you get the mentioned basic workup next visit with your PCP, your PCP may decide not to get them or add new tests based on their clinical decision)  On your next visit with your primary care physician please Get Medicines reviewed and adjusted.  If you experience worsening of your admission symptoms, develop shortness of breath, life threatening emergency, suicidal or homicidal thoughts you must seek medical attention immediately by calling 911 or calling your MD immediately  if symptoms less severe.  You Must read complete instructions/literature along with all the possible adverse reactions/side effects for all the Medicines you take and that have been prescribed to you. Take any new Medicines after you have completely  understood and accpet all the possible adverse reactions/side effects.   Do not drive, operate heavy machinery, perform activities at heights, swimming or participation in water activities or provide baby sitting services if your were admitted for syncope or siezures until you have seen by Primary MD or a Neurologist and advised to do so again.  Do not drive when taking Pain medications.   Procedures/Studies: DG HIP UNILAT WITH PELVIS 2-3 VIEWS LEFT Result Date: 09/15/2024 EXAM: 2 VIEW(S) XRAY OF THE LEFT HIP 09/15/2024 11:05:00 AM COMPARISON: None available. CLINICAL HISTORY: Deficient knowledge of open reduction and internal (ORIF) fixation of hip FINDINGS: BONES  AND JOINTS: Left total hip arthroplasty with post-surgical changes. SOFT TISSUES: Expected soft tissue gas overlying the left hip. IMPRESSION: 1. Left total hip arthroplasty with post-surgical changes and expected soft tissue gas overlying the left hip. Electronically signed by: Ryan Chess MD 09/15/2024 11:36 AM EST RP Workstation: [id number]   DG Pelvis Portable Result Date: 09/14/2024 CLINICAL DATA:  Postop. EXAM: PORTABLE PELVIS 1-2 VIEWS COMPARISON:  Preoperative imaging. FINDINGS: Left hip arthroplasty in expected alignment. No periprosthetic lucency or fracture. Recent postsurgical change includes air and edema in the soft tissues. IMPRESSION: Left hip arthroplasty without immediate postoperative complication. Electronically Signed   By: Andrea Gasman M.D.   On: 09/14/2024 12:44   DG HIP UNILAT WITH PELVIS 1V LEFT Result Date: 09/14/2024 CLINICAL DATA:  Elective surgery. EXAM: DG HIP (WITH OR WITHOUT PELVIS) 1V*L* COMPARISON:  Preoperative imaging FINDINGS: Six fluoroscopic spot views of the pelvis and left hip obtained in the operating room. Sequential images during hip arthroplasty. Fluoroscopy time 23 seconds. Dose 2.3 mGy. IMPRESSION: Intraoperative fluoroscopy during left hip arthroplasty. Electronically Signed   By:  Andrea Gasman M.D.   On: 09/14/2024 12:00   DG C-Arm 1-60 Min-No Report Result Date: 09/14/2024 Fluoroscopy was utilized by the requesting physician.  No radiographic interpretation.   DG C-Arm 1-60 Min-No Report Result Date: 09/14/2024 Fluoroscopy was utilized by the requesting physician.  No radiographic interpretation.   DG CHEST PORT 1 VIEW Result Date: 09/14/2024 EXAM: 1 VIEW(S) XRAY OF THE CHEST 09/14/2024 05:30:50 AM COMPARISON: 11/08/2018 CLINICAL HISTORY: Closed left hip fracture, initial encounter (HCC); Preoperative testing FINDINGS: LUNGS AND PLEURA: No focal pulmonary opacity. No pleural effusion. No pneumothorax. HEART AND MEDIASTINUM: Aortic arch atherosclerosis. No acute abnormality of the cardiac and mediastinal silhouettes. BONES AND SOFT TISSUES: No acute osseous abnormality. IMPRESSION: 1. No acute process. Electronically signed by: Waddell Calk MD 09/14/2024 05:34 AM EST RP Workstation: [id number]   DG HIP UNILAT WITH PELVIS 2-3 VIEWS LEFT Result Date: 09/14/2024 EXAM: 2 OR MORE VIEW(S) XRAY OF THE LEFT HIP 09/14/2024 02:56:00 AM COMPARISON: None available. CLINICAL HISTORY: Fall FINDINGS: BONES AND JOINTS: Acute fracture of the subcapital left femoral neck. Superior displacement of the distal fracture fragment. SOFT TISSUES: The soft tissues are unremarkable. IMPRESSION: 1. Acute displaced  subcapital left femoral neck fracture. Electronically signed by: Norman Gatlin MD 09/14/2024 03:02 AM EST RP Workstation: [id number]   CT CHEST LUNG CA SCREEN LOW DOSE W/O CM Result Date: 09/03/2024 CLINICAL DATA:  Current 54 pack-year smoker. EXAM: CT CHEST WITHOUT CONTRAST LOW-DOSE FOR LUNG CANCER SCREENING TECHNIQUE: Multidetector CT imaging of the chest was performed following the standard protocol without IV contrast. RADIATION DOSE REDUCTION: This exam was performed according to the departmental dose-optimization program which includes automated exposure control, adjustment of  the mA and/or kV according to patient size and/or use of iterative reconstruction technique. COMPARISON:  08/27/2023. FINDINGS: Cardiovascular: Atherosclerotic calcification of the aorta, aortic valve and coronary arteries. Heart size normal. No pericardial effusion. Mediastinum/Nodes: No pathologically enlarged mediastinal or axillary lymph nodes. Hilar regions are difficult to definitively evaluate without IV contrast. Esophagus is grossly unremarkable. Lungs/Pleura: Mild biapical pleuroparenchymal scarring. No suspicious pulmonary nodules. No pleural fluid. Airway is unremarkable. Upper Abdomen: Visualized portions of the liver, gallbladder, adrenal glands, kidneys, spleen, pancreas, stomach and bowel are grossly unremarkable. No upper abdominal adenopathy. Musculoskeletal: Degenerative changes in the spine.  Osteopenia. IMPRESSION: 1. Lung-RADS 1, negative. Continue annual screening with low-dose chest CT without contrast in 12 months. 2. Aortic atherosclerosis (ICD10-I70.0). Coronary artery  calcification. Electronically Signed   By: Newell Eke M.D.   On: 09/03/2024 11:39     The results of significant diagnostics from this hospitalization (including imaging, microbiology, ancillary and laboratory) are listed below for reference.     Microbiology: No results found for this or any previous visit (from the past 240 hours).   Labs: BNP (last 3 results) No results for input(s): BNP in the last 8760 hours. Basic Metabolic Panel: Recent Labs  Lab 09/14/24 0139 09/15/24 0318 09/16/24 0323  NA 138 134* 137  K 3.9 3.7 3.5  CL 100 97* 98  CO2 29 28 29   GLUCOSE 108* 117* 113*  BUN 14 16 14   CREATININE 0.63 0.69 0.57  CALCIUM  10.0 8.4* 9.1   Liver Function Tests: Recent Labs  Lab 09/14/24 0139  AST 36  ALT 26  ALKPHOS 104  BILITOT 0.2  PROT 7.8  ALBUMIN 4.7   No results for input(s): LIPASE, AMYLASE in the last 168 hours. No results for input(s): AMMONIA in the last 168  hours. CBC: Recent Labs  Lab 09/14/24 0139 09/15/24 0318 09/16/24 0323  WBC 10.6* 17.2* 15.1*  HGB 13.2 9.4* 9.8*  HCT 40.2 27.7* 28.8*  MCV 96.2 95.2 94.1  PLT 367 241 235   Cardiac Enzymes: No results for input(s): CKTOTAL, CKMB, CKMBINDEX, TROPONINI in the last 168 hours. BNP: Invalid input(s): POCBNP CBG: No results for input(s): GLUCAP in the last 168 hours. D-Dimer No results for input(s): DDIMER in the last 72 hours. Hgb A1c No results for input(s): HGBA1C in the last 72 hours. Lipid Profile No results for input(s): CHOL, HDL, LDLCALC, TRIG, CHOLHDL, LDLDIRECT in the last 72 hours. Thyroid  function studies No results for input(s): TSH, T4TOTAL, T3FREE, THYROIDAB in the last 72 hours.  Invalid input(s): FREET3 Anemia work up No results for input(s): VITAMINB12, FOLATE, FERRITIN, TIBC, IRON, RETICCTPCT in the last 72 hours. Urinalysis    Component Value Date/Time   COLORURINE YELLOW 10/11/2023 1224   APPEARANCEUR Cloudy (A) 10/11/2023 1224   LABSPEC 1.020 10/11/2023 1224   PHURINE 6.5 10/11/2023 1224   GLUCOSEU NEGATIVE 10/11/2023 1224   HGBUR NEGATIVE 10/11/2023 1224   BILIRUBINUR Negative 02/06/2024 1446   KETONESUR NEGATIVE 10/11/2023 1224   PROTEINUR Positive (A) 02/06/2024 1446   UROBILINOGEN 0.2 02/06/2024 1446   UROBILINOGEN 0.2 10/11/2023 1224   NITRITE Negative 02/06/2024 1446   NITRITE POSITIVE (A) 10/11/2023 1224   LEUKOCYTESUR Small (1+) (A) 02/06/2024 1446   LEUKOCYTESUR MODERATE (A) 10/11/2023 1224   Sepsis Labs Recent Labs  Lab 09/14/24 0139 09/15/24 0318 09/16/24 0323  WBC 10.6* 17.2* 15.1*   Microbiology No results found for this or any previous visit (from the past 240 hours).   Time coordinating discharge:  I have spent 35 minutes face to face with the patient and on the ward discussing the patients care, assessment, plan and disposition with other care givers. >50% of the time was  devoted counseling the patient about the risks and benefits of treatment/Discharge disposition and coordinating care.   SIGNED:   Burgess JAYSON Dare, MD  [facility] 09/17/2024, 11:11 AM   If 7PM-7AM, please contact night-coverage      [1]  Allergies Allergen Reactions   Lisinopril  Cough    Mild cough after a month

## 2024-09-17 NOTE — Progress Notes (Signed)
 Physical Therapy Treatment Patient Details Name: Jasmine Nelson MRN: [id number] DOB: 1956/09/21 Today's Date: 09/17/2024   History of Present Illness 68 y.o. year old female presents to [facility], ED after a mechanical fall onto her patio last night resulting in inability to ambulate with severe left hip pain. Dx of fracture of neck of femur. s/p THA-AA 09/14/24 with fascia ilaica block.. Pt with past medical history of hypertension, hyperlipidemia, COPD, tobacco use disorder, anxiety, ADHD, and insomnia.    PT Comments  POD # 3 am session Pt able to self transfer in/OOB using her leg strap.  Pt with increased ability to transfer on/off toilet.  Assisted with amb in hallway and practiced stairs.  General stair comments: up forward 2 steps NO RAILS using walker 25% VC's on proper walker placement and proper sequencing.  Then returned to room to perform some TE's following HEP handout.  Instructed on proper tech, freq as well as use of ICE.   Addressed all mobility questions, discussed appropriate activity, educated on use of ICE.  Pt ready for D/C to home. Sister coming to pick her up about 11:30am.    If plan is discharge home, recommend the following: A little help with walking and/or transfers;A little help with bathing/dressing/bathroom;Assistance with cooking/housework;Help with stairs or ramp for entrance   Can travel by private vehicle        Equipment Recommendations  Rolling walker (2 wheels);BSC/3in1    Recommendations for Other Services       Precautions / Restrictions Precautions Precautions: Fall Restrictions Weight Bearing Restrictions Per Provider Order: No LLE Weight Bearing Per Provider Order: Weight bearing as tolerated     Mobility  Bed Mobility   Bed Mobility: Supine to Sit, Sit to Supine     Supine to sit: Modified independent (Device/Increase time) Sit to supine: Modified independent (Device/Increase time)   General bed mobility comments: Pt self able  using her strap    Transfers Overall transfer level: Needs assistance Equipment used: Rolling walker (2 wheels) Transfers: Sit to/from Stand Sit to Stand: Modified independent (Device/Increase time)           General transfer comment: Pt self able with good safety cognition and use of hands to steady self.    Ambulation/Gait Ambulation/Gait assistance: Supervision Gait Distance (Feet): 75 Feet Assistive device: Rolling walker (2 wheels) Gait Pattern/deviations: Step-to pattern, Step-through pattern Gait velocity: decreased     General Gait Details: tolerated a functional distance amb in hallway with < 25% VC's on proper walker to self distance and safety with turns.   Stairs Stairs: Yes Stairs assistance: Contact guard assist, Min assist Stair Management: No rails, Step to pattern, Forwards, With walker Number of Stairs: 2 General stair comments: up forward 2 steps NO RAILS using walker 25% VC's on proper walker placement and proper sequencing.   Wheelchair Mobility     Tilt Bed    Modified Rankin (Stroke Patients Only)       Balance                                            Communication Communication Communication: No apparent difficulties  Cognition Arousal: Alert Behavior During Therapy: WFL for tasks assessed/performed   PT - Cognitive impairments: No apparent impairments  PT - Cognition Comments: AxO x 3 pleasant Lady who lives home with Spouse.  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  Pt was IND propr to her fall.  Sister is coming to take her home. Following commands: Intact      Cueing Cueing Techniques: Verbal cues  Exercises  Total Hip Replacement TE's following HEP Handout 10 reps ankle pumps 05 reps knee presses 05 reps heel slides 05 reps SAQ's 05 reps ABD 05 reps all standing TE's Instructed how to use a belt loop to assist  Followed by ICE     General Comments         Pertinent Vitals/Pain Pain Assessment Pain Assessment: 0-10 Pain Score: 7  Pain Location: L hip with activity Pain Descriptors / Indicators: Discomfort, Operative site guarding, Tender Pain Intervention(s): Monitored during session, Repositioned, Ice applied, Patient requesting pain meds-RN notified    Home Living                          Prior Function            PT Goals (current goals can now be found in the care plan section) Progress towards PT goals: Progressing toward goals    Frequency    7X/week      PT Plan      Co-evaluation              AM-PAC PT 6 Clicks Mobility   Outcome Measure  Help needed turning from your back to your side while in a flat bed without using bedrails?: None Help needed moving from lying on your back to sitting on the side of a flat bed without using bedrails?: None Help needed moving to and from a bed to a chair (including a wheelchair)?: None Help needed standing up from a chair using your arms (e.g., wheelchair or bedside chair)?: None Help needed to walk in hospital room?: None Help needed climbing 3-5 steps with a railing? : A Little 6 Click Score: 23    End of Session Equipment Utilized During Treatment: Gait belt Activity Tolerance: Patient tolerated treatment well Patient left: in chair;with call bell/phone within reach Nurse Communication: Mobility status PT Visit Diagnosis: Unsteadiness on feet (R26.81)     Time: 0922-0950 PT Time Calculation (min) (ACUTE ONLY): 28 min  Charges:    $Gait Training: 8-22 mins $Therapeutic Activity: 8-22 mins PT General Charges $$ ACUTE PT VISIT: 1 Visit                     {Fawaz Borquez  PTA Acute  Rehabilitation Services Office M-F          [phone]

## 2024-09-17 NOTE — Care Management Important Message (Signed)
 Important Message  Patient Details IM Letter given. Name: Jasmine Nelson MRN: [id number] Date of Birth: [birth year]   Important Message Given:  Yes - Medicare IM     Melba Ates 09/17/2024, 10:52 AM

## 2024-09-22 ENCOUNTER — Other Ambulatory Visit: Payer: Self-pay | Admitting: Internal Medicine

## 2024-09-23 ENCOUNTER — Other Ambulatory Visit (HOSPITAL_COMMUNITY): Payer: Self-pay

## 2024-09-23 ENCOUNTER — Other Ambulatory Visit: Payer: Self-pay

## 2024-09-23 MED ORDER — LOSARTAN POTASSIUM 100 MG PO TABS
100.0000 mg | ORAL_TABLET | Freq: Every day | ORAL | 1 refills | Status: AC
  Filled 2024-09-23: qty 90, 90d supply, fill #0

## 2024-09-24 ENCOUNTER — Other Ambulatory Visit (HOSPITAL_COMMUNITY): Payer: Self-pay

## 2024-09-26 ENCOUNTER — Other Ambulatory Visit (HOSPITAL_COMMUNITY): Payer: Self-pay

## 2024-09-26 ENCOUNTER — Other Ambulatory Visit: Payer: Self-pay

## 2024-09-29 ENCOUNTER — Other Ambulatory Visit (HOSPITAL_COMMUNITY): Payer: Self-pay

## 2024-09-29 ENCOUNTER — Telehealth: Payer: Self-pay

## 2024-09-29 ENCOUNTER — Other Ambulatory Visit: Payer: Self-pay | Admitting: Physician Assistant

## 2024-09-29 ENCOUNTER — Other Ambulatory Visit: Payer: Self-pay

## 2024-09-29 MED ORDER — HYDROCODONE-ACETAMINOPHEN 5-325 MG PO TABS
1.0000 | ORAL_TABLET | Freq: Four times a day (QID) | ORAL | 0 refills | Status: DC | PRN
  Filled 2024-09-29 (×2): qty 30, 8d supply, fill #0

## 2024-09-29 NOTE — Telephone Encounter (Signed)
 Received a fax stating pt has discharge  from hospital. Follow up with pt. Set appt for 10/07/2024. Pt states she will contact us  to reschedule if her sister is unable to bring her in.

## 2024-09-30 ENCOUNTER — Ambulatory Visit: Admitting: Physician Assistant

## 2024-09-30 ENCOUNTER — Other Ambulatory Visit (INDEPENDENT_AMBULATORY_CARE_PROVIDER_SITE_OTHER): Payer: Self-pay

## 2024-09-30 DIAGNOSIS — M25552 Pain in left hip: Secondary | ICD-10-CM | POA: Diagnosis not present

## 2024-09-30 NOTE — Progress Notes (Signed)
 "  Post-Op Visit Note   Patient: Jasmine Nelson           Date of Birth: 1956/05/20           MRN: [id number] Visit Date: 09/30/2024 PCP: Charlett Apolinar POUR, MD   Assessment & Plan:  Chief Complaint:  Chief Complaint  Patient presents with   Left Hip - Routine Post Op    09/14/2024-L THA   Visit Diagnoses:  1. Pain in left hip     Plan: Patient is a pleasant 69 year old female who comes in today 2 weeks status post left total hip replacement from a femoral neck fracture.  She has been doing okay.  She is taking Norco twice daily for pain.  She has been getting home health physical therapy.  She has been compliant taking Eliquis  twice daily for DVT prophylaxis.  Of note she is seeing someone for osteoporosis but has not yet started medication.  Examination of left hip reveals a well healed surgical incision with nylon sutures in place.  No evidence of infection or cellulitis.  Calves are soft nontender.  She is neurovascular intact distally.  Today, sutures were removed and Steri-Strips applied.  She will continue with her Eliquis  twice daily for another 2 weeks and then transition to a baby aspirin twice daily for 2 weeks.  I have discussed the need to follow-up with her osteoporosis doctor to start treatment.  She will follow-up with us  in 4 weeks for repeat evaluation and AP pelvis x-rays.  Call with concerns or questions.  Follow-Up Instructions: Return in about 4 weeks (around 10/28/2024).   Orders:  Orders Placed This Encounter  Procedures   XR HIP UNILAT W OR W/O PELVIS 2-3 VIEWS LEFT   No orders of the defined types were placed in this encounter.   Imaging: XR HIP UNILAT W OR W/O PELVIS 2-3 VIEWS LEFT Result Date: 09/30/2024 Well-seated prosthesis without complication   PMFS History: Patient Active Problem List   Diagnosis Date Noted   Protein-calorie malnutrition, severe 09/15/2024   Closed left hip fracture, initial encounter (HCC) 09/14/2024   Hip fracture (HCC) 09/14/2024    Essential hypertension 09/14/2024   Hyperlipidemia 09/14/2024   Chronic obstructive pulmonary disease (COPD) (HCC) 09/14/2024   ADHD 09/14/2024   Closed subcapital fracture of neck of femur, left, initial encounter (HCC) 09/14/2024   Genetic testing 04/19/2015   Scalp lesion 11/09/2013   Glaucoma 10/31/2013   Abnormal Pap smear of cervix 10/31/2013   Tobacco dependence 10/31/2013   Infection of nose 10/31/2013   DERMATITIS, HANDS 07/23/2009   TOBACCO USE 03/09/2008   OTHER CIRCADIAN RHYTHM SLEEP DISORDER 03/09/2008   Osteoporosis 03/09/2008   GENERALIZED ANXIETY DISORDER 12/17/2007   Anxiety with depression 12/17/2007   INSOMNIA 12/17/2007   Past Medical History:  Diagnosis Date   Allergy    Anxiety    Cataract    both eyes   Chicken pox    COPD (chronic obstructive pulmonary disease) (HCC)    Depression    Glaucoma    Hx of thyroid  disease    3rd grade  resolved    Hypertension    Ingestion of substance    3 04   Osteoporosis    Shift work sleep disorder     Family History  Problem Relation Age of Onset   Cancer Mother 55       Died at 2 of Non-Hodgkins Lymphoma   Lymphoma Mother        1997  Colon polyps Mother        many; required regular colonoscopies   Alcohol  abuse Father    COPD Father    Depression Father    Hyperlipidemia Father    Hypertension Father        deceased   Colon polyps Father        many; required regular colonoscopies   Colon cancer Sister        dx. 57-56   Other Sister        +CHEK2 mutation   COPD Maternal Aunt    Ovarian cancer Maternal Aunt 62   Stroke Maternal Grandmother    Heart Problems Maternal Grandmother    Cancer Maternal Grandfather 44       Brain Cancer   Arthritis Paternal Grandmother    Depression Paternal Grandmother    Glaucoma Paternal Grandmother    Breast cancer Paternal Grandmother        dx. 50s   Uterine cancer Paternal Grandmother        dx. 60s   Renal cancer Paternal Grandfather         smoker   Multiple myeloma Maternal Uncle        dx. well before his 35s   Ovarian cancer Other    Esophageal cancer Neg Hx    Stomach cancer Neg Hx    Rectal cancer Neg Hx     Past Surgical History:  Procedure Laterality Date   ADENOIDECTOMY  1959   BREAST BIOPSY Right 2019   COLONOSCOPY     TOTAL HIP ARTHROPLASTY Left 09/14/2024   Procedure: ARTHROPLASTY, HIP, TOTAL, ANTERIOR APPROACH;  Surgeon: Jerri Kay HERO, MD;  Location: [facility];  Service: Orthopedics;  Laterality: Left;   Social History   Occupational History   Not on file  Tobacco Use   Smoking status: Every Day    Current packs/day: 0.50    Types: Cigarettes   Smokeless tobacco: Never  Vaping Use   Vaping status: Some Days  Substance and Sexual Activity   Alcohol  use: Yes    Comment: occasional wine   Drug use: No   Sexual activity: Not on file     "

## 2024-10-01 ENCOUNTER — Other Ambulatory Visit: Payer: Self-pay

## 2024-10-02 ENCOUNTER — Other Ambulatory Visit (HOSPITAL_COMMUNITY): Payer: Self-pay

## 2024-10-03 ENCOUNTER — Other Ambulatory Visit (HOSPITAL_COMMUNITY): Payer: Self-pay

## 2024-10-03 ENCOUNTER — Other Ambulatory Visit: Payer: Self-pay

## 2024-10-03 MED ORDER — BELSOMRA 20 MG PO TABS
1.0000 | ORAL_TABLET | Freq: Every day | ORAL | 0 refills | Status: AC
  Filled 2024-10-03: qty 90, 90d supply, fill #0

## 2024-10-06 ENCOUNTER — Other Ambulatory Visit (HOSPITAL_COMMUNITY): Payer: Self-pay

## 2024-10-06 ENCOUNTER — Other Ambulatory Visit: Payer: Self-pay

## 2024-10-06 MED ORDER — GUANFACINE HCL ER 4 MG PO TB24
4.0000 mg | ORAL_TABLET | Freq: Every morning | ORAL | 3 refills | Status: AC
  Filled 2024-10-06: qty 90, 90d supply, fill #0

## 2024-10-07 ENCOUNTER — Other Ambulatory Visit: Payer: Self-pay

## 2024-10-07 ENCOUNTER — Ambulatory Visit (INDEPENDENT_AMBULATORY_CARE_PROVIDER_SITE_OTHER): Admitting: Internal Medicine

## 2024-10-07 ENCOUNTER — Encounter: Payer: Self-pay | Admitting: Internal Medicine

## 2024-10-07 VITALS — BP 104/52 | HR 83 | Temp 98.5°F | Ht 65.0 in | Wt 112.6 lb

## 2024-10-07 DIAGNOSIS — Z8781 Personal history of (healed) traumatic fracture: Secondary | ICD-10-CM | POA: Diagnosis not present

## 2024-10-07 DIAGNOSIS — D649 Anemia, unspecified: Secondary | ICD-10-CM

## 2024-10-07 DIAGNOSIS — Z96642 Presence of left artificial hip joint: Secondary | ICD-10-CM

## 2024-10-07 DIAGNOSIS — Z79899 Other long term (current) drug therapy: Secondary | ICD-10-CM

## 2024-10-07 DIAGNOSIS — R253 Fasciculation: Secondary | ICD-10-CM | POA: Diagnosis not present

## 2024-10-07 NOTE — Progress Notes (Signed)
 "  Chief Complaint  Patient presents with   Hospitalization Follow-up    Would like to recheck lab. Pt reports she received PT at home.     HPI: Jasmine Nelson 69 y.o. come in for   fu  after  hip fracture and surgery  Last visit with me 5 25  Closed fx of leg hip 12 2125 and  dr Jerri did surgery .  Doing well.   Generally   PT  a few times per week. Has walker .  Appt in Jan 20  has appt  fu in feb  Cocnern about At night  using recliner for postion ease.  Bu leg upper thigh left gets twitching in and uncomfortable  hard to sleep.  ? If  muscle relaxant would help. She is taking pain oxycodone  1-2 x per day so far  with help   Resp no sx  down to a few cig ROS: See pertinent positives and negatives per HPI.  Past Medical History:  Diagnosis Date   Allergy    Anxiety    Cataract    both eyes   Chicken pox    COPD (chronic obstructive pulmonary disease) (HCC)    Depression    Glaucoma    Hx of thyroid  disease    3rd grade  resolved    Hypertension    Ingestion of substance    3 04   Osteoporosis    Shift work sleep disorder     Family History  Problem Relation Age of Onset   Cancer Mother 30       Died at 33 of Non-Hodgkins Lymphoma   Lymphoma Mother        9   Colon polyps Mother        many; required regular colonoscopies   Alcohol  abuse Father    COPD Father    Depression Father    Hyperlipidemia Father    Hypertension Father        deceased   Colon polyps Father        many; required regular colonoscopies   Colon cancer Sister        dx. 72-56   Other Sister        +CHEK2 mutation   COPD Maternal Aunt    Ovarian cancer Maternal Aunt 62   Stroke Maternal Grandmother    Heart Problems Maternal Grandmother    Cancer Maternal Grandfather 81       Brain Cancer   Arthritis Paternal Grandmother    Depression Paternal Grandmother    Glaucoma Paternal Grandmother    Breast cancer Paternal Grandmother        dx. 50s   Uterine cancer Paternal  Grandmother        dx. 60s   Renal cancer Paternal Grandfather        smoker   Multiple myeloma Maternal Uncle        dx. well before his 61s   Ovarian cancer Other    Esophageal cancer Neg Hx    Stomach cancer Neg Hx    Rectal cancer Neg Hx     Social History   Socioeconomic History   Marital status: Married    Spouse name: Not on file   Number of children: Not on file   Years of education: Not on file   Highest education level: Bachelor's degree (e.g., BA, AB, BS)  Occupational History   Not on file  Tobacco Use   Smoking status: Every Day  Current packs/day: 0.50    Types: Cigarettes   Smokeless tobacco: Never  Vaping Use   Vaping status: Some Days  Substance and Sexual Activity   Alcohol  use: Yes    Comment: occasional wine   Drug use: No   Sexual activity: Not on file  Other Topics Concern   Not on file  Social History Narrative   Averages 5-8 hours of sleep per day.   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   [id number]      Social Drivers of Health   Tobacco Use: High Risk (10/07/2024)   Patient History    Smoking Tobacco Use: Every Day    Smokeless Tobacco Use: Never    Passive Exposure: Not on file  Financial Resource Strain: Low Risk (08/08/2024)   Overall Financial Resource Strain (CARDIA)    Difficulty of Paying Living Expenses: Not hard at all  Food Insecurity: No Food Insecurity (09/14/2024)   Epic    Worried About Radiation Protection Practitioner of Food in the Last Year: Never true    Ran Out of Food in the Last Year: Never true  Transportation Needs: No Transportation Needs (09/14/2024)   Epic    Lack of Transportation (Medical): No    Lack of Transportation (Non-Medical): No  Physical Activity: Inactive (08/08/2024)   Exercise Vital Sign    Days of Exercise per Week: 0 days    Minutes of Exercise per Session: Not on  file  Stress: Stress Concern Present (08/08/2024)   [employer] of Occupational Health - Occupational Stress Questionnaire    Feeling of Stress: To some extent  Social Connections: Socially Isolated (09/14/2024)   Social Connection and Isolation Panel    Frequency of Communication with Friends and Family: Once a week    Frequency of Social Gatherings with Friends and Family: Never    Attends Religious Services: Never    Database Administrator or Organizations: No    Attends Banker Meetings: Never    Marital Status: Married  Depression (PHQ2-9): Low Risk (06/09/2024)   Depression (PHQ2-9)    PHQ-2 Score: 0  Alcohol  Screen: Low Risk (08/08/2024)   Alcohol  Screen    Last Alcohol  Screening Score (AUDIT): 4  Housing: Low Risk (09/14/2024)   Epic    Unable to Pay for Housing in the Last Year: No    Number of Times Moved in the Last Year: 0    Homeless in the Last Year: No  Utilities: Not At Risk (09/14/2024)   Epic    Threatened with loss of utilities: No  Health Literacy: Adequate Health Literacy (06/09/2024)   B1300 Health Literacy    Frequency of need for help with medical instructions: Never    Outpatient Medications Prior to Visit  Medication Sig Dispense Refill   amphetamine -dextroamphetamine  (ADDERALL) 20 MG tablet Take 1 tablet (20 mg total) by mouth 2 (two) times daily. (Patient taking differently: Take 20 mg by mouth 2 (two) times daily. Pt reports she is taking 1-1.5 a day) 180 tablet 0   apixaban  (ELIQUIS ) 2.5 MG TABS tablet Take 1 tablet (2.5 mg total) by mouth 2 (two) times daily. To be taken after surgery to prevent blood clots 60 tablet 0   clobetasol  cream (TEMOVATE ) 0.05 % Apply 1  Application topically 2 (two) times daily. (Patient taking differently: Apply 1 Application topically 2 (two) times daily. prn) 60 g 1   gabapentin  (NEURONTIN ) 400 MG capsule Take 6 capsules (2,400 mg total) by mouth at bedtime. (09-05-24) 540 capsule 0   guanFACINE   (INTUNIV ) 4 MG TB24 ER tablet Take 1 tablet (4 mg total) by mouth in the morning.DOSE INCREASE 90 tablet 3   hydrochlorothiazide  (HYDRODIURIL ) 12.5 MG tablet Take 1 tablet (12.5 mg total) by mouth daily. Please schedule appt for further refills 30 tablet 6   HYDROcodone -acetaminophen  (NORCO/VICODIN) 5-325 MG tablet Take 1 tablet by mouth every 6 (six) hours as needed for moderate pain (pain score 4-6) or severe pain (pain score 7-10). 30 tablet 0   Insulin  Pen Needle (PEN NEEDLES) 31G X 5 MM MISC Use with Tymlos  daily 100 each 3   losartan  (COZAAR ) 100 MG tablet Take 1 tablet (100 mg total) by mouth daily. 90 tablet 1   nicotine  (NICODERM CQ  - DOSED IN MG/24 HOURS) 14 mg/24hr patch Place 1 patch (14 mg total) onto the skin daily. 28 patch 0   polyethylene glycol powder (GLYCOLAX /MIRALAX ) 17 GM/SCOOP powder Take 17 g by mouth daily as needed for mild constipation or moderate constipation. Dissolve 1 capful (17g) in 4-8 ounces of liquid and take by mouth daily. 238 g 0   rosuvastatin  (CRESTOR ) 5 MG tablet Take 1 tablet (5 mg total) by mouth daily. 90 tablet 1   Suvorexant  (BELSOMRA ) 20 MG TABS Take 1 tablet (20 mg total) by mouth at bedtime. 90 tablet 0   vortioxetine  HBr (TRINTELLIX ) 20 MG TABS tablet Take 1 tablet (20 mg total) by mouth daily. 90 tablet 0   Abaloparatide  (TYMLOS ) 3120 MCG/1.56ML SOPN Inject 80 mcg into the skin daily. (Patient not taking: Reported on 10/07/2024) 1.56 mL 11   Alcohol  Swabs  (WEBCOL ALCOHOL  PREP MEDIUM) 70 % PADS Use daily for Tymlos  injection 100 each 3   cycloSPORINE , PF, 0.09 % SOLN Place 1 drop into both eyes 2 (two) times daily. (Patient not taking: Reported on 09/14/2024) 360 each 1   docusate sodium  (COLACE) 100 MG capsule Take 1 capsule (100 mg total) by mouth 2 (two) times daily. (Patient not taking: Reported on 10/07/2024) 60 capsule 0   guanFACINE  (INTUNIV ) 1 MG TB24 ER tablet Take 1 tablet (1 mg total) by mouth every morning. (Patient not taking: Reported on  10/07/2024) 90 tablet 0   sharps container Use as needed 1 each 3   Suvorexant  (BELSOMRA ) 20 MG TABS Take 1 tablet (20 mg total) by mouth at bedtime. 90 tablet 0   No facility-administered medications prior to visit.     EXAM:  BP (!) 104/52 (BP Location: Right Arm, Patient Position: Sitting, Cuff Size: Normal)   Pulse 83   Temp 98.5 F (36.9 C) (Oral)   Ht 5' 5 (1.651 m)   Wt 112 lb 9.6 oz (51.1 kg)   SpO2 (!) 83%   BMI 18.74 kg/m   Body mass index is 18.74 kg/m.  GENERAL: vitals reviewed and listed above, alert, oriented, appears well hydrated and in no acute distress slender    HEENT: atraumatic, conjunctiva  clear, no obvious abnormalities on inspection of external nose and ears  NECK: no obvious masses on inspection palpation  LUNGS: clear to auscultation bilaterally, no wheezes, rales or rhonchi, some distant bs no resp distress  CV: HRRR, no clubbing cyanosis or  peripheral edema nl cap refill  MS: moves all extremities without noticeable focal  abnormality gait  pretty steady   PSYCH: pleasant and cooperative, no obvious depression or anxiety Lab Results  Component Value Date   WBC 15.1 (H) 09/16/2024   HGB 9.8 (L) 09/16/2024   HCT 28.8 (L) 09/16/2024   PLT 235 09/16/2024   GLUCOSE 113 (H) 09/16/2024   CHOL 179 02/06/2024   TRIG 119.0 02/06/2024   HDL 80.20 02/06/2024   LDLCALC 75 02/06/2024   ALT 26 09/14/2024   AST 36 09/14/2024   NA 137 09/16/2024   K 3.5 09/16/2024   CL 98 09/16/2024   CREATININE 0.57 09/16/2024   BUN 14 09/16/2024   CO2 29 09/16/2024   TSH 2.22 10/11/2023   HGBA1C 6.2 10/11/2023   BP Readings from Last 3 Encounters:  10/07/24 (!) 104/52  09/17/24 (!) 141/71  06/04/24 124/78    ASSESSMENT AND PLAN:  Discussed the following assessment and plan:  Muscle twitch - left upper leg thigh post left hip surgery - Plan: CBC with Differential/Platelet, Basic Metabolic Panel, Hemoglobin A1c, Magnesium   Anemia, unspecified type post op -  Plan: CBC with Differential/Platelet, Basic Metabolic Panel, Hemoglobin A1c, Magnesium   S/P hip replacement, left  Medication management  History of fracture of left hip Suspect symptoms are related to postop situation status post fracture. She is taking narcotic pain medicines 1-2 times a day at most   I advised that the surgical team should address the symptoms she is having as I believe is related to her injury and surgery recovery.  In addition she is on multiple medicines that have CNS effects.  Would want to prevent fall.  Check BMP and magnesium  today in addition to her CBC. I will forward the note question hopefully to the Ortho team and she should try to contact them also. -Patient advised to return or notify health care team  if  new concerns arise.  Patient Instructions  Will send a message to  your ortho team   about the muscle spasm and if they advise meds for this  . Or other intervention.   Update lab.  Today.         Tobechukwu Emmick K. Sigurd Pugh M.D. "

## 2024-10-07 NOTE — Patient Instructions (Addendum)
 Will send a message to  your ortho team   about the muscle spasm and if they advise meds for this  . Or other intervention.   Update lab.  Today.

## 2024-10-08 ENCOUNTER — Other Ambulatory Visit (HOSPITAL_COMMUNITY): Payer: Self-pay

## 2024-10-08 ENCOUNTER — Other Ambulatory Visit: Payer: Self-pay | Admitting: Physician Assistant

## 2024-10-08 ENCOUNTER — Telehealth: Payer: Self-pay | Admitting: Orthopaedic Surgery

## 2024-10-08 LAB — BASIC METABOLIC PANEL WITH GFR
BUN: 18 mg/dL (ref 6–23)
CO2: 31 meq/L (ref 19–32)
Calcium: 9.3 mg/dL (ref 8.4–10.5)
Chloride: 98 meq/L (ref 96–112)
Creatinine, Ser: 0.88 mg/dL (ref 0.40–1.20)
GFR: 67.08 mL/min
Glucose, Bld: 106 mg/dL — ABNORMAL HIGH (ref 70–99)
Potassium: 4.6 meq/L (ref 3.5–5.1)
Sodium: 139 meq/L (ref 135–145)

## 2024-10-08 LAB — CBC WITH DIFFERENTIAL/PLATELET
Basophils Absolute: 0.1 K/uL (ref 0.0–0.1)
Basophils Relative: 0.6 % (ref 0.0–3.0)
Eosinophils Absolute: 0.3 K/uL (ref 0.0–0.7)
Eosinophils Relative: 3.8 % (ref 0.0–5.0)
HCT: 29 % — ABNORMAL LOW (ref 36.0–46.0)
Hemoglobin: 9.7 g/dL — ABNORMAL LOW (ref 12.0–15.0)
Lymphocytes Relative: 13.3 % (ref 12.0–46.0)
Lymphs Abs: 1.2 K/uL (ref 0.7–4.0)
MCHC: 33.5 g/dL (ref 30.0–36.0)
MCV: 93.2 fl (ref 78.0–100.0)
Monocytes Absolute: 0.6 K/uL (ref 0.1–1.0)
Monocytes Relative: 6.7 % (ref 3.0–12.0)
Neutro Abs: 6.8 K/uL (ref 1.4–7.7)
Neutrophils Relative %: 75.6 % (ref 43.0–77.0)
Platelets: 490 K/uL — ABNORMAL HIGH (ref 150.0–400.0)
RBC: 3.11 Mil/uL — ABNORMAL LOW (ref 3.87–5.11)
RDW: 12.9 % (ref 11.5–15.5)
WBC: 9 K/uL (ref 4.0–10.5)

## 2024-10-08 LAB — MAGNESIUM: Magnesium: 2.1 mg/dL (ref 1.5–2.5)

## 2024-10-08 LAB — HEMOGLOBIN A1C: Hgb A1c MFr Bld: 5.5 % (ref 4.6–6.5)

## 2024-10-08 MED ORDER — METHOCARBAMOL 500 MG PO TABS
500.0000 mg | ORAL_TABLET | Freq: Two times a day (BID) | ORAL | 0 refills | Status: DC | PRN
  Filled 2024-10-08: qty 20, 10d supply, fill #0

## 2024-10-08 MED ORDER — HYDROCODONE-ACETAMINOPHEN 5-325 MG PO TABS
1.0000 | ORAL_TABLET | Freq: Two times a day (BID) | ORAL | 0 refills | Status: DC | PRN
  Filled 2024-10-08: qty 20, 10d supply, fill #0

## 2024-10-08 NOTE — Telephone Encounter (Signed)
 It is likely from the nerves irritated from the surgery.  I would recommend taking a muscle relaxer which I also sent in.  Norco weaned to bid prn

## 2024-10-08 NOTE — Telephone Encounter (Signed)
 Patient called. Says at night she is feeling some twitching in that surgical area. Also she would like some pain medication called in for her. Her cb# [phone]

## 2024-10-09 ENCOUNTER — Other Ambulatory Visit: Payer: Self-pay | Admitting: Internal Medicine

## 2024-10-09 ENCOUNTER — Other Ambulatory Visit (HOSPITAL_COMMUNITY): Payer: Self-pay

## 2024-10-09 ENCOUNTER — Other Ambulatory Visit: Payer: Self-pay

## 2024-10-09 DIAGNOSIS — F329 Major depressive disorder, single episode, unspecified: Secondary | ICD-10-CM | POA: Diagnosis not present

## 2024-10-09 DIAGNOSIS — F909 Attention-deficit hyperactivity disorder, unspecified type: Secondary | ICD-10-CM | POA: Diagnosis not present

## 2024-10-09 DIAGNOSIS — S72012D Unspecified intracapsular fracture of left femur, subsequent encounter for closed fracture with routine healing: Secondary | ICD-10-CM | POA: Diagnosis not present

## 2024-10-09 DIAGNOSIS — Z7901 Long term (current) use of anticoagulants: Secondary | ICD-10-CM | POA: Diagnosis not present

## 2024-10-09 DIAGNOSIS — D72829 Elevated white blood cell count, unspecified: Secondary | ICD-10-CM | POA: Diagnosis not present

## 2024-10-09 DIAGNOSIS — J449 Chronic obstructive pulmonary disease, unspecified: Secondary | ICD-10-CM | POA: Diagnosis not present

## 2024-10-09 DIAGNOSIS — I10 Essential (primary) hypertension: Secondary | ICD-10-CM | POA: Diagnosis not present

## 2024-10-09 DIAGNOSIS — Z9181 History of falling: Secondary | ICD-10-CM | POA: Diagnosis not present

## 2024-10-09 DIAGNOSIS — E43 Unspecified severe protein-calorie malnutrition: Secondary | ICD-10-CM | POA: Diagnosis not present

## 2024-10-09 DIAGNOSIS — E785 Hyperlipidemia, unspecified: Secondary | ICD-10-CM | POA: Diagnosis not present

## 2024-10-09 DIAGNOSIS — F17210 Nicotine dependence, cigarettes, uncomplicated: Secondary | ICD-10-CM | POA: Diagnosis not present

## 2024-10-09 DIAGNOSIS — Z96642 Presence of left artificial hip joint: Secondary | ICD-10-CM | POA: Diagnosis not present

## 2024-10-09 MED ORDER — ROSUVASTATIN CALCIUM 5 MG PO TABS
5.0000 mg | ORAL_TABLET | Freq: Every day | ORAL | 1 refills | Status: AC
  Filled 2024-10-09: qty 90, 90d supply, fill #0

## 2024-10-09 NOTE — Telephone Encounter (Signed)
 Called and spoke with patient

## 2024-10-14 ENCOUNTER — Ambulatory Visit: Admitting: Orthopaedic Surgery

## 2024-10-15 ENCOUNTER — Encounter: Payer: Self-pay | Admitting: Internal Medicine

## 2024-10-15 ENCOUNTER — Other Ambulatory Visit: Payer: Self-pay | Admitting: Internal Medicine

## 2024-10-15 DIAGNOSIS — D649 Anemia, unspecified: Secondary | ICD-10-CM

## 2024-10-15 NOTE — Telephone Encounter (Signed)
 So  the labs were .stable     Although I would think that your hemoglobin should have been  better than it was as you are still anemic..   The sodium level has been good and magnesium  potassium and A1c .   I think you should supplement with iron  OTC  oral as tolerated since the post op anemia is the same . And hasn't improved   Were you able to get  an opinion about your leg twitching from the ortho team ?   The lab results dont show any cause of the problem.   I advise  to check cbc diff  in about 2 months   earlier if you wish . To be sure  coming up closer to normal ( I have already placed the order in the system )    I

## 2024-10-15 NOTE — Telephone Encounter (Signed)
 I sent her a message   make sure she received it

## 2024-10-16 NOTE — Telephone Encounter (Signed)
 Pt received message and responded. It is forwarded back to Dr. Charlett on the other message thread.

## 2024-10-16 NOTE — Telephone Encounter (Signed)
 Noted.

## 2024-10-20 ENCOUNTER — Other Ambulatory Visit: Payer: Self-pay

## 2024-10-20 NOTE — Progress Notes (Signed)
 Patient is aware of copay and would like to pay full amount instead of enrolling in mpp. She would like a call back in 3 weeks to schedule refill

## 2024-10-23 ENCOUNTER — Telehealth: Payer: Self-pay | Admitting: Physician Assistant

## 2024-10-23 ENCOUNTER — Other Ambulatory Visit (HOSPITAL_COMMUNITY): Payer: Self-pay

## 2024-10-23 NOTE — Telephone Encounter (Signed)
 Patient called. She would like hydrocodone  called in for her. [facility] [phone] - [place], [place] - [address] [[id number]]

## 2024-10-24 ENCOUNTER — Other Ambulatory Visit: Payer: Self-pay | Admitting: Physician Assistant

## 2024-10-24 ENCOUNTER — Other Ambulatory Visit: Payer: Self-pay

## 2024-10-24 ENCOUNTER — Other Ambulatory Visit (HOSPITAL_COMMUNITY): Payer: Self-pay

## 2024-10-24 MED ORDER — TRINTELLIX 20 MG PO TABS
20.0000 mg | ORAL_TABLET | Freq: Every day | ORAL | 3 refills | Status: AC
  Filled 2024-10-24: qty 90, 90d supply, fill #0

## 2024-10-24 MED ORDER — HYDROCODONE-ACETAMINOPHEN 5-325 MG PO TABS: 1.0000 | ORAL_TABLET | Freq: Two times a day (BID) | ORAL | 0 refills | Status: AC | PRN

## 2024-10-24 NOTE — Telephone Encounter (Signed)
 sent

## 2024-10-25 ENCOUNTER — Other Ambulatory Visit (HOSPITAL_COMMUNITY): Payer: Self-pay

## 2024-10-27 ENCOUNTER — Other Ambulatory Visit (HOSPITAL_COMMUNITY): Payer: Self-pay

## 2024-10-27 ENCOUNTER — Other Ambulatory Visit: Payer: Self-pay

## 2024-10-28 ENCOUNTER — Encounter: Admitting: Physician Assistant

## 2024-10-30 ENCOUNTER — Other Ambulatory Visit: Payer: Self-pay

## 2024-10-30 ENCOUNTER — Other Ambulatory Visit (HOSPITAL_COMMUNITY): Payer: Self-pay

## 2024-10-30 ENCOUNTER — Other Ambulatory Visit: Payer: Self-pay | Admitting: Physician Assistant

## 2024-10-30 MED ORDER — METHOCARBAMOL 500 MG PO TABS
500.0000 mg | ORAL_TABLET | Freq: Two times a day (BID) | ORAL | 0 refills | Status: AC | PRN
  Filled 2024-10-30: qty 20, 10d supply, fill #0

## 2024-11-07 ENCOUNTER — Encounter: Admitting: Physician Assistant

## 2025-06-03 ENCOUNTER — Ambulatory Visit: Admitting: Family Medicine

## 2025-06-15 ENCOUNTER — Ambulatory Visit
